# Patient Record
Sex: Male | Born: 1950 | Race: Black or African American | Hispanic: No | Marital: Married | Smoking: Former smoker
Health system: Southern US, Community
[De-identification: ages and names within clinical notes are randomized; demographics above are authoritative.]

## PROBLEM LIST (undated history)

## (undated) DIAGNOSIS — Z950 Presence of cardiac pacemaker: Secondary | ICD-10-CM

## (undated) DIAGNOSIS — G4733 Obstructive sleep apnea (adult) (pediatric): Secondary | ICD-10-CM

## (undated) DIAGNOSIS — E119 Type 2 diabetes mellitus without complications: Secondary | ICD-10-CM

## (undated) DIAGNOSIS — E042 Nontoxic multinodular goiter: Secondary | ICD-10-CM

## (undated) DIAGNOSIS — N289 Disorder of kidney and ureter, unspecified: Secondary | ICD-10-CM

## (undated) DIAGNOSIS — R0600 Dyspnea, unspecified: Secondary | ICD-10-CM

## (undated) DIAGNOSIS — M109 Gout, unspecified: Secondary | ICD-10-CM

## (undated) DIAGNOSIS — I447 Left bundle-branch block, unspecified: Secondary | ICD-10-CM

## (undated) DIAGNOSIS — I2510 Atherosclerotic heart disease of native coronary artery without angina pectoris: Secondary | ICD-10-CM

## (undated) DIAGNOSIS — D649 Anemia, unspecified: Secondary | ICD-10-CM

## (undated) DIAGNOSIS — R112 Nausea with vomiting, unspecified: Secondary | ICD-10-CM

## (undated) DIAGNOSIS — I509 Heart failure, unspecified: Secondary | ICD-10-CM

## (undated) DIAGNOSIS — Z9989 Dependence on other enabling machines and devices: Secondary | ICD-10-CM

## (undated) DIAGNOSIS — I10 Essential (primary) hypertension: Secondary | ICD-10-CM

## (undated) DIAGNOSIS — Z972 Presence of dental prosthetic device (complete) (partial): Secondary | ICD-10-CM

## (undated) DIAGNOSIS — Z87442 Personal history of urinary calculi: Secondary | ICD-10-CM

## (undated) DIAGNOSIS — E669 Obesity, unspecified: Secondary | ICD-10-CM

## (undated) DIAGNOSIS — E785 Hyperlipidemia, unspecified: Secondary | ICD-10-CM

## (undated) DIAGNOSIS — I639 Cerebral infarction, unspecified: Secondary | ICD-10-CM

## (undated) HISTORY — DX: Left bundle-branch block, unspecified: I44.7

## (undated) HISTORY — DX: Hyperlipidemia, unspecified: E78.5

## (undated) HISTORY — DX: Obesity, unspecified: E66.9

## (undated) HISTORY — DX: Essential (primary) hypertension: I10

## (undated) HISTORY — DX: Nontoxic multinodular goiter: E04.2

## (undated) HISTORY — DX: Atherosclerotic heart disease of native coronary artery without angina pectoris: I25.10

## (undated) HISTORY — PX: LAPAROSCOPIC CHOLECYSTECTOMY: SUR755

## (undated) HISTORY — DX: Cerebral infarction, unspecified: I63.9

## (undated) HISTORY — DX: Gout, unspecified: M10.9

## (undated) HISTORY — DX: Disorder of kidney and ureter, unspecified: N28.9

---

## 1995-10-05 ENCOUNTER — Other Ambulatory Visit: Payer: Self-pay

## 1997-12-17 ENCOUNTER — Inpatient Hospital Stay (HOSPITAL_COMMUNITY): Admission: EM | Admit: 1997-12-17 | Discharge: 1997-12-21 | Payer: Self-pay

## 1997-12-27 ENCOUNTER — Encounter: Admission: RE | Admit: 1997-12-27 | Discharge: 1998-03-27 | Payer: Self-pay | Admitting: Internal Medicine

## 1998-01-03 ENCOUNTER — Ambulatory Visit (HOSPITAL_COMMUNITY): Admission: RE | Admit: 1998-01-03 | Discharge: 1998-01-03 | Payer: Self-pay | Admitting: Internal Medicine

## 1998-03-14 ENCOUNTER — Ambulatory Visit (HOSPITAL_COMMUNITY): Admission: RE | Admit: 1998-03-14 | Discharge: 1998-03-14 | Payer: Self-pay | Admitting: Internal Medicine

## 1999-04-13 ENCOUNTER — Emergency Department (HOSPITAL_COMMUNITY): Admission: EM | Admit: 1999-04-13 | Discharge: 1999-04-13 | Payer: Self-pay | Admitting: Emergency Medicine

## 2001-07-12 ENCOUNTER — Ambulatory Visit (HOSPITAL_BASED_OUTPATIENT_CLINIC_OR_DEPARTMENT_OTHER): Admission: RE | Admit: 2001-07-12 | Discharge: 2001-07-12 | Payer: Self-pay | Admitting: Internal Medicine

## 2001-08-29 ENCOUNTER — Ambulatory Visit (HOSPITAL_BASED_OUTPATIENT_CLINIC_OR_DEPARTMENT_OTHER): Admission: RE | Admit: 2001-08-29 | Discharge: 2001-08-29 | Payer: Self-pay | Admitting: Internal Medicine

## 2004-04-08 ENCOUNTER — Encounter: Admission: RE | Admit: 2004-04-08 | Discharge: 2004-04-08 | Payer: Self-pay | Admitting: Internal Medicine

## 2005-05-16 ENCOUNTER — Encounter: Admission: RE | Admit: 2005-05-16 | Discharge: 2005-05-16 | Payer: Self-pay | Admitting: Sports Medicine

## 2009-06-14 DIAGNOSIS — I639 Cerebral infarction, unspecified: Secondary | ICD-10-CM

## 2009-06-14 HISTORY — DX: Cerebral infarction, unspecified: I63.9

## 2010-02-07 ENCOUNTER — Inpatient Hospital Stay (HOSPITAL_COMMUNITY): Admission: EM | Admit: 2010-02-07 | Discharge: 2010-02-10 | Payer: Self-pay | Admitting: Internal Medicine

## 2010-02-07 ENCOUNTER — Emergency Department (HOSPITAL_BASED_OUTPATIENT_CLINIC_OR_DEPARTMENT_OTHER): Admission: EM | Admit: 2010-02-07 | Discharge: 2010-02-07 | Payer: Self-pay | Admitting: Emergency Medicine

## 2010-02-07 ENCOUNTER — Ambulatory Visit: Payer: Self-pay | Admitting: Radiology

## 2010-02-08 ENCOUNTER — Encounter (INDEPENDENT_AMBULATORY_CARE_PROVIDER_SITE_OTHER): Payer: Self-pay | Admitting: Internal Medicine

## 2010-02-09 ENCOUNTER — Encounter (INDEPENDENT_AMBULATORY_CARE_PROVIDER_SITE_OTHER): Payer: Self-pay | Admitting: Internal Medicine

## 2010-04-27 ENCOUNTER — Inpatient Hospital Stay (HOSPITAL_BASED_OUTPATIENT_CLINIC_OR_DEPARTMENT_OTHER): Admission: RE | Admit: 2010-04-27 | Discharge: 2010-04-27 | Payer: Self-pay | Admitting: Interventional Cardiology

## 2010-08-25 LAB — POCT I-STAT GLUCOSE
Glucose, Bld: 140 mg/dL — ABNORMAL HIGH (ref 70–99)
Operator id: 141321

## 2010-08-28 LAB — COMPREHENSIVE METABOLIC PANEL
ALT: 15 U/L (ref 0–53)
ALT: 16 U/L (ref 0–53)
AST: 16 U/L (ref 0–37)
AST: 18 U/L (ref 0–37)
Albumin: 3.4 g/dL — ABNORMAL LOW (ref 3.5–5.2)
Albumin: 3.7 g/dL (ref 3.5–5.2)
Alkaline Phosphatase: 107 U/L (ref 39–117)
Alkaline Phosphatase: 84 U/L (ref 39–117)
BUN: 15 mg/dL (ref 6–23)
BUN: 19 mg/dL (ref 6–23)
CO2: 25 mEq/L (ref 19–32)
CO2: 26 mEq/L (ref 19–32)
Calcium: 10.2 mg/dL (ref 8.4–10.5)
Calcium: 10.6 mg/dL — ABNORMAL HIGH (ref 8.4–10.5)
Chloride: 107 mEq/L (ref 96–112)
Chloride: 109 mEq/L (ref 96–112)
Creatinine, Ser: 1.63 mg/dL — ABNORMAL HIGH (ref 0.4–1.5)
Creatinine, Ser: 1.9 mg/dL — ABNORMAL HIGH (ref 0.4–1.5)
GFR calc Af Amer: 44 mL/min — ABNORMAL LOW (ref >60)
GFR calc Af Amer: 53 mL/min — ABNORMAL LOW (ref >60)
GFR calc non Af Amer: 37 mL/min — ABNORMAL LOW (ref >60)
GFR calc non Af Amer: 44 mL/min — ABNORMAL LOW (ref >60)
Glucose, Bld: 200 mg/dL — ABNORMAL HIGH (ref 70–99)
Glucose, Bld: 222 mg/dL — ABNORMAL HIGH (ref 70–99)
Potassium: 3.3 mEq/L — ABNORMAL LOW (ref 3.5–5.1)
Potassium: 3.6 mEq/L (ref 3.5–5.1)
Sodium: 138 mEq/L (ref 135–145)
Sodium: 142 mEq/L (ref 135–145)
Total Bilirubin: 0.6 mg/dL (ref 0.3–1.2)
Total Bilirubin: 0.6 mg/dL (ref 0.3–1.2)
Total Protein: 6.5 g/dL (ref 6.0–8.3)
Total Protein: 7.1 g/dL (ref 6.0–8.3)

## 2010-08-28 LAB — IRON AND TIBC
Iron: 70 ug/dL (ref 42–135)
Saturation Ratios: 28 % (ref 20–55)
TIBC: 249 ug/dL (ref 215–435)
UIBC: 179 ug/dL

## 2010-08-28 LAB — LIPID PANEL
HDL: 34 mg/dL — ABNORMAL LOW (ref >39)
LDL Cholesterol: 87 mg/dL (ref 0–99)
Total CHOL/HDL Ratio: 5 RATIO
Triglycerides: 248 mg/dL — ABNORMAL HIGH (ref <150)
VLDL: 50 mg/dL — ABNORMAL HIGH (ref 0–40)

## 2010-08-28 LAB — CBC
HCT: 38.1 % — ABNORMAL LOW (ref 39.0–52.0)
HCT: 38.5 % — ABNORMAL LOW (ref 39.0–52.0)
HCT: 39 % (ref 39.0–52.0)
Hemoglobin: 12.6 g/dL — ABNORMAL LOW (ref 13.0–17.0)
Hemoglobin: 12.7 g/dL — ABNORMAL LOW (ref 13.0–17.0)
Hemoglobin: 12.9 g/dL — ABNORMAL LOW (ref 13.0–17.0)
MCH: 26.4 pg (ref 26.0–34.0)
MCH: 28.5 pg (ref 26.0–34.0)
MCHC: 32.7 g/dL (ref 30.0–36.0)
MCHC: 33.9 g/dL (ref 30.0–36.0)
MCV: 80.7 fL (ref 78.0–100.0)
MCV: 84.1 fL (ref 78.0–100.0)
Platelets: 195 10*3/uL (ref 150–400)
Platelets: 215 10*3/uL (ref 150–400)
RBC: 4.52 MIL/uL (ref 4.22–5.81)
RBC: 4.77 MIL/uL (ref 4.22–5.81)
RBC: 4.79 MIL/uL (ref 4.22–5.81)
RDW: 15.8 % — ABNORMAL HIGH (ref 11.5–15.5)
RDW: 15.9 % — ABNORMAL HIGH (ref 11.5–15.5)
WBC: 7.8 10*3/uL (ref 4.0–10.5)
WBC: 8.5 10*3/uL (ref 4.0–10.5)
WBC: 9.3 10*3/uL (ref 4.0–10.5)

## 2010-08-28 LAB — POCT CARDIAC MARKERS
CKMB, poc: 2.4 ng/mL (ref 1.0–8.0)
Myoglobin, poc: 198 ng/mL (ref 12–200)
Troponin i, poc: <0.05 ng/mL (ref 0.00–0.09)

## 2010-08-28 LAB — HEMOGLOBIN A1C
Hgb A1c MFr Bld: 10.2 % — ABNORMAL HIGH (ref <5.7)
Mean Plasma Glucose: 246 mg/dL — ABNORMAL HIGH (ref <117)

## 2010-08-28 LAB — CARDIAC PANEL(CRET KIN+CKTOT+MB+TROPI)
CK, MB: 2.7 ng/mL (ref 0.3–4.0)
Relative Index: 1.1 (ref 0.0–2.5)
Total CK: 240 U/L — ABNORMAL HIGH (ref 7–232)
Troponin I: 0.04 ng/mL (ref 0.00–0.06)

## 2010-08-28 LAB — URINALYSIS, ROUTINE W REFLEX MICROSCOPIC
Bilirubin Urine: NEGATIVE
Glucose, UA: 100 mg/dL — AB
Hgb urine dipstick: NEGATIVE
Ketones, ur: NEGATIVE mg/dL
Leukocytes, UA: NEGATIVE
Nitrite: NEGATIVE
Protein, ur: 30 mg/dL — AB
Specific Gravity, Urine: 1.01 (ref 1.005–1.030)
Urobilinogen, UA: 1 mg/dL (ref 0.0–1.0)
pH: 7 (ref 5.0–8.0)

## 2010-08-28 LAB — PHOSPHORUS: Phosphorus: 2.8 mg/dL (ref 2.3–4.6)

## 2010-08-28 LAB — GLUCOSE, CAPILLARY
Glucose-Capillary: 150 mg/dL — ABNORMAL HIGH (ref 70–99)
Glucose-Capillary: 160 mg/dL — ABNORMAL HIGH (ref 70–99)
Glucose-Capillary: 184 mg/dL — ABNORMAL HIGH (ref 70–99)
Glucose-Capillary: 185 mg/dL — ABNORMAL HIGH (ref 70–99)
Glucose-Capillary: 195 mg/dL — ABNORMAL HIGH (ref 70–99)
Glucose-Capillary: 196 mg/dL — ABNORMAL HIGH (ref 70–99)
Glucose-Capillary: 198 mg/dL — ABNORMAL HIGH (ref 70–99)

## 2010-08-28 LAB — MRSA PCR SCREENING: MRSA by PCR: NEGATIVE

## 2010-08-28 LAB — BASIC METABOLIC PANEL
Calcium: 10.1 mg/dL (ref 8.4–10.5)
GFR calc Af Amer: 56 mL/min — ABNORMAL LOW (ref >60)
GFR calc non Af Amer: 46 mL/min — ABNORMAL LOW (ref >60)
Potassium: 3.3 mEq/L — ABNORMAL LOW (ref 3.5–5.1)
Sodium: 138 mEq/L (ref 135–145)

## 2010-08-28 LAB — APTT: aPTT: 31 seconds (ref 24–37)

## 2010-08-28 LAB — PTH, INTACT AND CALCIUM: PTH: 142.9 pg/mL — ABNORMAL HIGH (ref 14.0–72.0)

## 2010-08-28 LAB — RETICULOCYTES
RBC.: 4.72 MIL/uL (ref 4.22–5.81)
Retic Ct Pct: 1.2 % (ref 0.4–3.1)

## 2010-08-28 LAB — FOLATE: Folate: 10.9 ng/mL

## 2010-08-28 LAB — DIFFERENTIAL
Basophils Absolute: 0 10*3/uL (ref 0.0–0.1)
Basophils Relative: 0 % (ref 0–1)
Lymphocytes Relative: 46 % (ref 12–46)
Monocytes Absolute: 0.7 10*3/uL (ref 0.1–1.0)
Monocytes Relative: 9 % (ref 3–12)
Neutro Abs: 3.3 10*3/uL (ref 1.7–7.7)
Neutrophils Relative %: 43 % (ref 43–77)

## 2010-08-28 LAB — PROTIME-INR
INR: 1.09 (ref 0.00–1.49)
Prothrombin Time: 14.3 seconds (ref 11.6–15.2)

## 2010-08-28 LAB — VITAMIN D 1,25 DIHYDROXY: Vitamin D 1, 25 (OH)2 Total: 59 pg/mL (ref 18–72)

## 2010-08-28 LAB — FERRITIN: Ferritin: 267 ng/mL (ref 22–322)

## 2010-08-28 LAB — HOMOCYSTEINE: Homocysteine: 14.3 umol/L (ref 4.0–15.4)

## 2010-08-28 LAB — VITAMIN B12: Vitamin B-12: 329 pg/mL (ref 211–911)

## 2011-09-29 ENCOUNTER — Other Ambulatory Visit: Payer: Self-pay | Admitting: Internal Medicine

## 2012-01-06 ENCOUNTER — Other Ambulatory Visit: Payer: Self-pay | Admitting: Internal Medicine

## 2012-01-22 ENCOUNTER — Other Ambulatory Visit: Payer: Self-pay | Admitting: Internal Medicine

## 2012-02-02 ENCOUNTER — Other Ambulatory Visit: Payer: Self-pay | Admitting: Internal Medicine

## 2012-02-03 LAB — BASIC METABOLIC PANEL
BUN: 24 mg/dL — AB (ref 4–21)
Creatinine: 2.2 mg/dL — AB (ref 0.6–1.3)
Potassium: 3.9 mmol/L (ref 3.4–5.3)

## 2012-02-03 LAB — HEPATIC FUNCTION PANEL: ALT: 16 U/L (ref 10–40)

## 2012-02-03 LAB — HEMOGLOBIN A1C: Hgb A1c MFr Bld: 11.5 % — AB (ref 4.0–6.0)

## 2012-02-18 ENCOUNTER — Other Ambulatory Visit: Payer: Self-pay | Admitting: Internal Medicine

## 2012-08-02 ENCOUNTER — Encounter: Payer: Self-pay | Admitting: Hematology

## 2013-01-26 ENCOUNTER — Other Ambulatory Visit: Payer: Self-pay | Admitting: Interventional Cardiology

## 2013-01-26 DIAGNOSIS — E049 Nontoxic goiter, unspecified: Secondary | ICD-10-CM

## 2013-02-01 ENCOUNTER — Ambulatory Visit
Admission: RE | Admit: 2013-02-01 | Discharge: 2013-02-01 | Disposition: A | Discharge status: Home / Self Care | Payer: 59 | Source: Ambulatory Visit | Attending: Interventional Cardiology | Admitting: Interventional Cardiology

## 2013-02-01 DIAGNOSIS — E049 Nontoxic goiter, unspecified: Secondary | ICD-10-CM

## 2013-02-05 ENCOUNTER — Other Ambulatory Visit: Payer: Self-pay | Admitting: Interventional Cardiology

## 2013-02-05 DIAGNOSIS — E041 Nontoxic single thyroid nodule: Secondary | ICD-10-CM

## 2013-02-14 ENCOUNTER — Other Ambulatory Visit: Payer: 59

## 2013-02-14 ENCOUNTER — Ambulatory Visit
Admission: RE | Admit: 2013-02-14 | Discharge: 2013-02-14 | Disposition: A | Discharge status: Home / Self Care | Payer: 59 | Source: Ambulatory Visit | Attending: Interventional Cardiology | Admitting: Interventional Cardiology

## 2013-02-14 ENCOUNTER — Other Ambulatory Visit (HOSPITAL_COMMUNITY)
Admission: RE | Admit: 2013-02-14 | Discharge: 2013-02-14 | Disposition: A | Discharge status: Home / Self Care | Payer: 59 | Source: Ambulatory Visit | Attending: Interventional Radiology | Admitting: Interventional Radiology

## 2013-02-14 DIAGNOSIS — E041 Nontoxic single thyroid nodule: Secondary | ICD-10-CM

## 2013-08-06 ENCOUNTER — Ambulatory Visit: Payer: 59 | Admitting: Interventional Cardiology

## 2013-08-23 ENCOUNTER — Other Ambulatory Visit: Payer: Self-pay | Admitting: Internal Medicine

## 2013-08-23 DIAGNOSIS — E042 Nontoxic multinodular goiter: Secondary | ICD-10-CM

## 2013-09-12 ENCOUNTER — Ambulatory Visit: Payer: 59 | Admitting: Interventional Cardiology

## 2013-09-13 ENCOUNTER — Ambulatory Visit
Admission: RE | Admit: 2013-09-13 | Discharge: 2013-09-13 | Disposition: A | Discharge status: Home / Self Care | Payer: 59 | Source: Ambulatory Visit | Attending: Internal Medicine | Admitting: Internal Medicine

## 2013-09-13 DIAGNOSIS — E042 Nontoxic multinodular goiter: Secondary | ICD-10-CM

## 2013-09-27 ENCOUNTER — Ambulatory Visit: Payer: 59 | Admitting: Interventional Cardiology

## 2013-11-13 ENCOUNTER — Encounter: Payer: Self-pay | Admitting: *Deleted

## 2013-11-15 ENCOUNTER — Encounter: Payer: Self-pay | Admitting: Interventional Cardiology

## 2013-11-15 ENCOUNTER — Ambulatory Visit (INDEPENDENT_AMBULATORY_CARE_PROVIDER_SITE_OTHER): Payer: 59 | Admitting: Interventional Cardiology

## 2013-11-15 VITALS — BP 130/74 | HR 48 | Ht 72.0 in | Wt 287.0 lb

## 2013-11-15 DIAGNOSIS — I2510 Atherosclerotic heart disease of native coronary artery without angina pectoris: Secondary | ICD-10-CM

## 2013-11-15 DIAGNOSIS — I10 Essential (primary) hypertension: Secondary | ICD-10-CM

## 2013-11-15 DIAGNOSIS — G4733 Obstructive sleep apnea (adult) (pediatric): Secondary | ICD-10-CM | POA: Insufficient documentation

## 2013-11-15 DIAGNOSIS — I6350 Cerebral infarction due to unspecified occlusion or stenosis of unspecified cerebral artery: Secondary | ICD-10-CM

## 2013-11-15 DIAGNOSIS — N529 Male erectile dysfunction, unspecified: Secondary | ICD-10-CM | POA: Insufficient documentation

## 2013-11-15 DIAGNOSIS — I639 Cerebral infarction, unspecified: Secondary | ICD-10-CM | POA: Insufficient documentation

## 2013-11-15 DIAGNOSIS — N183 Chronic kidney disease, stage 3 unspecified: Secondary | ICD-10-CM

## 2013-11-15 DIAGNOSIS — I720 Aneurysm of carotid artery: Secondary | ICD-10-CM | POA: Insufficient documentation

## 2013-11-15 DIAGNOSIS — G4730 Sleep apnea, unspecified: Secondary | ICD-10-CM

## 2013-11-15 DIAGNOSIS — E785 Hyperlipidemia, unspecified: Secondary | ICD-10-CM

## 2013-11-15 HISTORY — DX: Atherosclerotic heart disease of native coronary artery without angina pectoris: I25.10

## 2013-11-15 HISTORY — DX: Male erectile dysfunction, unspecified: N52.9

## 2013-11-15 HISTORY — DX: Aneurysm of carotid artery: I72.0

## 2013-11-15 HISTORY — DX: Essential (primary) hypertension: I10

## 2013-11-15 HISTORY — DX: Cerebral infarction, unspecified: I63.9

## 2013-11-15 HISTORY — DX: Obstructive sleep apnea (adult) (pediatric): G47.33

## 2013-11-15 MED ORDER — SILDENAFIL CITRATE 100 MG PO TABS: 100.0000 mg | ORAL_TABLET | Freq: Every day | ORAL | Status: DC | PRN

## 2013-11-15 NOTE — Patient Instructions (Addendum)
Your physician has recommended you make the following change in your medication:  1) Viagra 100mg  to be taken as needed. An Rx has been sent to your pharmacy  Take all other medications as prescribed  Your physician wants you to follow-up in: 9 months You will receive a reminder letter in the mail two months in advance. If you don't receive a letter, please call our office to schedule the follow-up appointment.

## 2013-11-15 NOTE — Progress Notes (Signed)
Patient ID: Matthew Wood, male   DOB: 04-Dec-1950, 63 y.o.   MRN: [id number]    [address] [place], [place]  [address] Phone: [phone] Fax:  [phone]  Date:  11/15/2013   ID:  Matthew Wood, DOB 03-09-1951, MRN [id number]  PCP:  No primary provider on file.   ASSESSMENT:  1. Chronic diastolic heart failure 2. Hypertension 3. Coronary artery disease, stable 4. Erectile dysfunction 5. Chronic kidney disease, stage III  PLAN:  1. no change in the current medical regimen 2. Call if angina or dyspnea or edema 3. Clinical followup in 6-9 months 4. Active lifestyle and weight loss   SUBJECTIVE: Matthew Wood is a 63 y.o. male who denies angina, orthopnea, and syncope. No PND or lower extremity edema has been noted. He denies nitroglycerin use. No neurological complaints. 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 Appetite is excellent and weight loss has not occurred.   Wt Readings from Last 3 Encounters:  11/15/13 287 lb (130.182 kg)  02/03/12 300 lb (136.079 kg)     Past Medical History  Diagnosis Date  . Hypertension   . Renal insufficiency   . Obesity   . Hyperlipidemia   . Coronary artery disease   . Stroke   . Diabetes   . Gout   . CVA (cerebrovascular accident) 2011    potine  . OSA (obstructive sleep apnea)     Current Outpatient Prescriptions  Medication Sig Dispense Refill  . allopurinol (ZYLOPRIM) 300 MG tablet Take 300 mg by mouth daily.      Marland Kitchen amLODipine-benazepril (LOTREL) 10-40 MG per capsule Take 1 capsule by mouth daily.      Marland Kitchen aspirin 325 MG tablet Take 325 mg by mouth daily.      . carvedilol (COREG) 25 MG tablet Take 25 mg by mouth 2 (two) times daily with a meal.      . cholecalciferol (VITAMIN D) 400 UNITS TABS tablet Take 400 Units by mouth daily.      . cloNIDine (CATAPRES) 0.2 MG tablet Take 0.2 mg by mouth 2 (two) times daily.      . furosemide (LASIX) 40 MG tablet Take 40 mg  by mouth 2 (two) times daily.      Marland Kitchen glimepiride (AMARYL) 4 MG tablet Take 4 mg by mouth 2 (two) times daily.      . insulin glargine (LANTUS) 100 UNIT/ML injection Inject 40 Units into the skin at bedtime.      . insulin lispro (HUMALOG) 100 UNIT/ML injection Inject into the skin 3 (three) times daily before meals.      . NON FORMULARY cpap as directed      . potassium chloride (K-DUR) 10 MEQ tablet TAKE ONE TABLET BY MOUTH TWICE DAILY AFTER A MEAL AND FULL GLASS OFWATER  60 tablet  2  . potassium chloride (K-DUR,KLOR-CON) 10 MEQ tablet Take 10 mEq by mouth 2 (two) times daily.      . rosuvastatin (CRESTOR) 20 MG tablet Take 20 mg by mouth daily.      . saxagliptin HCl (ONGLYZA) 5 MG TABS tablet Take 5 mg by mouth daily.       No current facility-administered medications for this visit.    Allergies:   No Known Allergies  Social History:  The patient  reports that he has quit smoking. He does not have any smokeless tobacco history on file. He reports that he drinks  alcohol.   ROS:  Please see the history of present illness.   No claudication. Seen Dr. Buddy Duty for diabetes control.  Seeing Dr. Marval Regal for renal function All other systems reviewed and negative.   OBJECTIVE: VS:  BP 130/74  Pulse 48  Ht 6' (1.829 m)  Wt 287 lb (130.182 kg)  BMI 38.92 kg/m2 Well nourished, well developed, in no acute distress, markedly obese HEENT: normal Neck: JVD absent. Carotid bruit absent but pulsatile left supraclavicular area  Cardiac:  normal S1, S2; RRR; no murmur Lungs:  clear to auscultation bilaterally, no wheezing, rhonchi or rales Abd: soft, nontender, no hepatomegaly Ext: Edema  Trace bilateral . Pulses 2+ bilateral  Skin: warm and dry Neuro:  CNs 2-12 intact, no focal abnormalities noted  EKG:  Sinus bradycardia at 48 beats per minute with left bundle branch block.       Signed, Illene Labrador III, MD 11/15/2013 12:27 PM

## 2013-11-29 NOTE — Patient Instructions (Signed)
Pt wife aware pt cannot use viagra. He is currently taking bidil and there is a interaction

## 2013-12-12 ENCOUNTER — Other Ambulatory Visit: Payer: Self-pay | Admitting: Gastroenterology

## 2014-03-29 ENCOUNTER — Other Ambulatory Visit: Payer: Self-pay | Admitting: Gastroenterology

## 2014-03-29 ENCOUNTER — Encounter (HOSPITAL_COMMUNITY): Payer: Self-pay | Admitting: Pharmacy Technician

## 2014-04-03 ENCOUNTER — Encounter (HOSPITAL_COMMUNITY): Payer: Self-pay | Admitting: *Deleted

## 2014-04-16 ENCOUNTER — Ambulatory Visit (HOSPITAL_COMMUNITY)
Admission: RE | Admit: 2014-04-16 | Discharge: 2014-04-16 | Disposition: A | Discharge status: Home / Self Care | Payer: 59 | Source: Ambulatory Visit | Attending: Gastroenterology | Admitting: Gastroenterology

## 2014-04-16 ENCOUNTER — Encounter (HOSPITAL_COMMUNITY)
Admission: RE | Disposition: A | Discharge status: Home / Self Care | Payer: Self-pay | Source: Ambulatory Visit | Attending: Gastroenterology

## 2014-04-16 ENCOUNTER — Ambulatory Visit (HOSPITAL_COMMUNITY): Payer: 59 | Admitting: Anesthesiology

## 2014-04-16 ENCOUNTER — Encounter (HOSPITAL_COMMUNITY): Payer: Self-pay | Admitting: *Deleted

## 2014-04-16 DIAGNOSIS — E042 Nontoxic multinodular goiter: Secondary | ICD-10-CM | POA: Diagnosis not present

## 2014-04-16 DIAGNOSIS — D123 Benign neoplasm of transverse colon: Secondary | ICD-10-CM | POA: Insufficient documentation

## 2014-04-16 DIAGNOSIS — I129 Hypertensive chronic kidney disease with stage 1 through stage 4 chronic kidney disease, or unspecified chronic kidney disease: Secondary | ICD-10-CM | POA: Diagnosis not present

## 2014-04-16 DIAGNOSIS — E780 Pure hypercholesterolemia: Secondary | ICD-10-CM | POA: Insufficient documentation

## 2014-04-16 DIAGNOSIS — D125 Benign neoplasm of sigmoid colon: Secondary | ICD-10-CM | POA: Diagnosis not present

## 2014-04-16 DIAGNOSIS — M109 Gout, unspecified: Secondary | ICD-10-CM | POA: Diagnosis not present

## 2014-04-16 DIAGNOSIS — N189 Chronic kidney disease, unspecified: Secondary | ICD-10-CM | POA: Insufficient documentation

## 2014-04-16 DIAGNOSIS — E1121 Type 2 diabetes mellitus with diabetic nephropathy: Secondary | ICD-10-CM | POA: Diagnosis not present

## 2014-04-16 DIAGNOSIS — G4733 Obstructive sleep apnea (adult) (pediatric): Secondary | ICD-10-CM | POA: Diagnosis not present

## 2014-04-16 DIAGNOSIS — I2510 Atherosclerotic heart disease of native coronary artery without angina pectoris: Secondary | ICD-10-CM | POA: Insufficient documentation

## 2014-04-16 DIAGNOSIS — Z1211 Encounter for screening for malignant neoplasm of colon: Secondary | ICD-10-CM | POA: Insufficient documentation

## 2014-04-16 DIAGNOSIS — D122 Benign neoplasm of ascending colon: Secondary | ICD-10-CM | POA: Insufficient documentation

## 2014-04-16 HISTORY — PX: COLONOSCOPY WITH PROPOFOL: SHX5780

## 2014-04-16 HISTORY — DX: Heart failure, unspecified: I50.9

## 2014-04-16 LAB — GLUCOSE, CAPILLARY: Glucose-Capillary: 129 mg/dL — ABNORMAL HIGH (ref 70–99)

## 2014-04-16 SURGERY — COLONOSCOPY WITH PROPOFOL
Anesthesia: Monitor Anesthesia Care

## 2014-04-16 MED ORDER — PROPOFOL 10 MG/ML IV BOLUS
INTRAVENOUS | Status: AC
  Filled 2014-04-16: qty 20

## 2014-04-16 MED ORDER — PROPOFOL INFUSION 10 MG/ML OPTIME
INTRAVENOUS | Status: DC | PRN
  Administered 2014-04-16: 140 ug/kg/min via INTRAVENOUS

## 2014-04-16 MED ORDER — LACTATED RINGERS IV SOLN
INTRAVENOUS | Status: DC | PRN
  Administered 2014-04-16: 13:00:00 via INTRAVENOUS

## 2014-04-16 MED ORDER — SODIUM CHLORIDE 0.9 % IV SOLN
INTRAVENOUS | Status: DC
  Administered 2014-04-16: 1000 mL via INTRAVENOUS

## 2014-04-16 SURGICAL SUPPLY — 21 items

## 2014-04-16 NOTE — Anesthesia Postprocedure Evaluation (Signed)
  Anesthesia Post-op Note  Patient: Matthew Wood  Procedure(s) Performed: Procedure(s): COLONOSCOPY WITH PROPOFOL (N/A)  Patient Location: PACU  Anesthesia Type:MAC  Level of Consciousness: awake  Airway and Oxygen Therapy: Patient Spontanous Breathing  Post-op Pain: none  Post-op Assessment: Post-op Vital signs reviewed, Patient's Cardiovascular Status Stable, Respiratory Function Stable, Patent Airway, No signs of Nausea or vomiting and Pain level controlled  Post-op Vital Signs: Reviewed and stable  Last Vitals:  Filed Vitals:   04/16/14 1500  BP:   Pulse: 62  Temp:   Resp: 17    Complications: No apparent anesthesia complications

## 2014-04-16 NOTE — Discharge Instructions (Signed)

## 2014-04-16 NOTE — Op Note (Signed)
Procedure: Baseline screening colonoscopy  Endoscopist: Earle Gell  Premedication: Propofol administered by anesthesia  Colonoscope was introduced into the rectum at 1:38 PM. Cecum was reached at 1:44 PM. Procedure concluded at 2:24 PM.  Procedure: The patient was placed in the left lateral decubitus position. Anal inspection and digital rectal exam were normal. The Pentax pediatric colonoscope was introduced into the rectum and advanced to the cecum. A normal-appearing ileocecal valve and appendiceal orifice were identified. Colonic preparation for the exam today was fair.  Rectum. Two 5-6 millimeters sized sessile polyps were removed with the hot snare. Retroflexed view of the distal rectum was normal.  Sigmoid colon and descending colon. From the distal sigmoid colon a 5 mm sessile polyp was removed with the cold snare.  Splenic flexure. Normal  Transverse colon. From the mid transverse colon, two 5 mm sized sessile polyps were removed with the cold snare.  Hepatic flexure. Normal  Ascending colon. Three 5 mm sized polyps were removed with the cold snare.  Cecum and ileocecal valve. Normal  Assessment: Multiple small polyps removed from the rectum and colon using the cold and hot snare  Recommendation: Schedule surveillance colonoscopy in 1 year

## 2014-04-16 NOTE — Anesthesia Preprocedure Evaluation (Signed)
Anesthesia Evaluation  Patient identified by MRN, date of birth, ID band Patient awake    Reviewed: Allergy & Precautions, H&P , NPO status , Patient's Chart, lab work & pertinent test results  History of Anesthesia Complications Negative for: history of anesthetic complications  Airway Mallampati: III  TM Distance: >3 FB Neck ROM: Full    Dental  (+) Missing   Pulmonary neg shortness of breath, sleep apnea and Continuous Positive Airway Pressure Ventilation , neg COPDneg recent URI, former smoker,  breath sounds clear to auscultation        Cardiovascular hypertension, Pt. on medications and Pt. on home beta blockers + CAD, + Peripheral Vascular Disease and +CHF Rhythm:Regular     Neuro/Psych CVA, No Residual Symptoms    GI/Hepatic negative GI ROS, Neg liver ROS,   Endo/Other  diabetes, Type 2, Insulin DependentHypothyroidism Morbid obesity  Renal/GU CRFRenal disease     Musculoskeletal   Abdominal   Peds  Hematology   Anesthesia Other Findings   Reproductive/Obstetrics                             Anesthesia Physical Anesthesia Plan  ASA: III  Anesthesia Plan: MAC   Post-op Pain Management:    Induction: Intravenous  Airway Management Planned: Natural Airway and Simple Face Mask  Additional Equipment: None  Intra-op Plan:   Post-operative Plan:   Informed Consent: I have reviewed the patients History and Physical, chart, labs and discussed the procedure including the risks, benefits and alternatives for the proposed anesthesia with the patient or authorized representative who has indicated his/her understanding and acceptance.   Dental advisory given  Plan Discussed with: CRNA and Surgeon  Anesthesia Plan Comments:         Anesthesia Quick Evaluation

## 2014-04-16 NOTE — H&P (Signed)
  Procedure: Baseline screening colonoscopy. Chronic kidney disease with proteinuria, diabetic nephropathy, coronary artery disease, carotid artery disease, obstructive sleep apnea syndrome.  History: The patient is a 63 year old male born Mar 29, 1951. He is scheduled to undergo his first screening colonoscopy with polypectomy to prevent colon cancer.  Past medical and surgical history: Cholecystectomy. Hypertension. 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 Gout. 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 Hypercholesterolemia. Obstructive sleep apnea syndrome. Coronary artery disease. Multinodular goiter. Obstructive sleep apnea syndrome.  Medication allergies: None  Exam: The patient is alert and lying comfortably on the endoscopy stretcher. Lungs are clear to auscultation. Cardiac exam reveals a regular rhythm. Abdomen is soft and nontender to palpation.  Plan: Proceed with baseline screening colonoscopy using propofol sedation

## 2014-04-16 NOTE — Transfer of Care (Signed)
Immediate Anesthesia Transfer of Care Note  Patient: Matthew Wood  Procedure(s) Performed: Procedure(s): COLONOSCOPY WITH PROPOFOL (N/A)  Patient Location: PACU  Anesthesia Type:MAC  Level of Consciousness: awake  Airway & Oxygen Therapy: Patient Spontanous Breathing and Patient connected to nasal cannula oxygen  Post-op Assessment: Report given to PACU RN and Post -op Vital signs reviewed and stable  Post vital signs: Reviewed and stable  Complications: No apparent anesthesia complications

## 2014-04-17 ENCOUNTER — Encounter (HOSPITAL_COMMUNITY): Payer: Self-pay | Admitting: Gastroenterology

## 2014-10-03 ENCOUNTER — Other Ambulatory Visit: Payer: Self-pay | Admitting: Internal Medicine

## 2014-10-03 DIAGNOSIS — E042 Nontoxic multinodular goiter: Secondary | ICD-10-CM

## 2014-10-24 ENCOUNTER — Ambulatory Visit
Admission: RE | Admit: 2014-10-24 | Discharge: 2014-10-24 | Disposition: A | Discharge status: Home / Self Care | Payer: 59 | Source: Ambulatory Visit | Attending: Internal Medicine | Admitting: Internal Medicine

## 2014-10-24 DIAGNOSIS — E042 Nontoxic multinodular goiter: Secondary | ICD-10-CM

## 2014-10-31 ENCOUNTER — Ambulatory Visit (INDEPENDENT_AMBULATORY_CARE_PROVIDER_SITE_OTHER): Payer: 59 | Admitting: Interventional Cardiology

## 2014-10-31 ENCOUNTER — Encounter: Payer: Self-pay | Admitting: Interventional Cardiology

## 2014-10-31 VITALS — BP 154/82 | HR 62 | Ht 71.0 in | Wt 296.0 lb

## 2014-10-31 DIAGNOSIS — I2510 Atherosclerotic heart disease of native coronary artery without angina pectoris: Secondary | ICD-10-CM | POA: Diagnosis not present

## 2014-10-31 DIAGNOSIS — E785 Hyperlipidemia, unspecified: Secondary | ICD-10-CM

## 2014-10-31 DIAGNOSIS — I10 Essential (primary) hypertension: Secondary | ICD-10-CM | POA: Diagnosis not present

## 2014-10-31 DIAGNOSIS — G4730 Sleep apnea, unspecified: Secondary | ICD-10-CM

## 2014-10-31 NOTE — Progress Notes (Signed)
Cardiology Office Note   Date:  10/31/2014   ID:  Matthew Wood, DOB 04-Sep-1950, MRN [id number]  PCP:  Kevan Ny, MD  Cardiologist:   Sinclair Grooms, MD   Chief Complaint  Patient presents with  . Coronary Artery Disease    NATIVE      History of Present Illness: Matthew Wood is a 64 y.o. male who presents for follow-up of chronic combined systolic and diastolic heart failure, hypertension, hyperlipidemia, and coronary artery disease. He furthermore has stage III chronic kidney disease.  We have is doing well although he is very inactive. He continues to weigh nearly 300 pounds. He has not had chest discomfort. He denies orthopnea. His wife states that with activity she can hear and breathing but he denies shortness of breath. He has not had palpitations or syncope. There've been no recurring episodes to suggest stroke although he did have some mild left arm numbness several weeks ago. This is completely resolved.    Past Medical History  Diagnosis Date  . Hypertension   . Renal insufficiency   . Obesity   . Hyperlipidemia   . Coronary artery disease   . Stroke   . Diabetes   . Gout   . CVA (cerebrovascular accident) 2011    potine  . OSA (obstructive sleep apnea)   . CHF (congestive heart failure)     Past Surgical History  Procedure Laterality Date  . Cholecystectomy    . Colonoscopy with propofol N/A 04/16/2014    Procedure: COLONOSCOPY WITH PROPOFOL;  Surgeon: [name], MD;  Location: [facility];  Service: Endoscopy;  Laterality: N/A;     Current Outpatient Prescriptions  Medication Sig Dispense Refill  . allopurinol (ZYLOPRIM) 300 MG tablet Take 300 mg by mouth daily.    Marland Kitchen amLODipine-benazepril (LOTREL) 10-40 MG per capsule Take 1 capsule by mouth every morning.     . carvedilol (COREG) 25 MG tablet Take 25 mg by mouth 2 (two) times daily with a meal.    . cloNIDine (CATAPRES) 0.2 MG tablet Take 0.2-1 mg by mouth 2 (two) times daily.       . furosemide (LASIX) 40 MG tablet Take 40 mg by mouth 2 (two) times daily.    Marland Kitchen glimepiride (AMARYL) 4 MG tablet Take 4 mg by mouth 2 (two) times daily.    . insulin glargine (LANTUS) 100 UNIT/ML injection Inject 33 Units into the skin at bedtime.     . insulin lispro (HUMALOG) 100 UNIT/ML injection Inject 12-16 Units into the skin 3 (three) times daily before meals.     . isosorbide-hydrALAZINE (BIDIL) 20-37.5 MG per tablet Take 1 tablet by mouth 2 (two) times daily.    . NON FORMULARY cpap as directed    . potassium chloride (K-DUR,KLOR-CON) 10 MEQ tablet Take 10 mEq by mouth 2 (two) times daily.    . rosuvastatin (CRESTOR) 20 MG tablet Take 20 mg by mouth daily.    . saxagliptin HCl (ONGLYZA) 5 MG TABS tablet Take 5 mg by mouth daily.     No current facility-administered medications for this visit.    Allergies:   Review of patient's allergies indicates no known allergies.    Social History:  The patient  reports that he quit smoking about 4 years ago. He does not have any smokeless tobacco history on file. He reports that he drinks alcohol. He reports that he does not use illicit drugs.   Family History:  The patient's  family history includes Breast cancer in his mother; Diabetes type II in his father; Heart attack in his father; Hypertension in his mother and sister; Stroke in his father.    ROS:  Please see the history of present illness.   Otherwise, review of systems are positive for left arm numbness, dizziness, relative inactivity..   All other systems are reviewed and negative.    PHYSICAL EXAM: VS:  BP 154/82 mmHg  Pulse 62  Ht 5\' 11"  (1.803 m)  Wt 296 lb (134.265 kg)  BMI 41.30 kg/m2 , BMI Body mass index is 41.3 kg/(m^2). GEN: Well nourished, well developed, in no acute distress. Morbidly obese. HEENT: normal Neck: no JVD, carotid bruits, or masses Cardiac: An S4 gallop is audible. RRR. There is no murmur, rubs, or S3 gallop. He does have 2+ bilateral lower extremity  edema . Respiratory:  clear to auscultation bilaterally, normal work of breathing GI: soft, nontender, nondistended, + BS MS: no deformity or atrophy Skin: warm and dry, no rash Neuro:  Strength and sensation are intact Psych: euthymic mood, full affect   EKG:  EKG is ordered today. The ekg ordered today demonstrates sinus rhythm with first-degree AV block, interventricular conduction delay, and lateral T wave inversion.   Recent Labs: No results found for requested labs within last 365 days.    Lipid Panel    Component Value Date/Time   CHOL  02/08/2010 0600    171        ATP III CLASSIFICATION:  <200     mg/dL   Desirable  200-239  mg/dL   Borderline High  >=240    mg/dL   High          TRIG 248* 02/08/2010 0600   HDL 34* 02/08/2010 0600   CHOLHDL 5.0 02/08/2010 0600   VLDL 50* 02/08/2010 0600   LDLCALC  02/08/2010 0600    87        Total Cholesterol/HDL:CHD Risk Coronary Heart Disease Risk Table                     Men   Women  1/2 Average Risk   3.4   3.3  Average Risk       5.0   4.4  2 X Average Risk   9.6   7.1  3 X Average Risk  23.4   11.0        Use the calculated Patient Ratio above and the CHD Risk Table to determine the patient's CHD Risk.        ATP III CLASSIFICATION (LDL):  <100     mg/dL   Optimal  100-129  mg/dL   Near or Above                    Optimal  130-159  mg/dL   Borderline  160-189  mg/dL   High  >190     mg/dL   Very High      Wt Readings from Last 3 Encounters:  10/31/14 296 lb (134.265 kg)  04/16/14 296 lb (134.265 kg)  11/15/13 287 lb (130.182 kg)      Other studies Reviewed: Additional studies/ records that were reviewed today include: None.   ASSESSMENT AND PLAN:  Coronary artery disease involving native coronary artery of native heart without angina pectoris: Absent  Essential hypertension: Borderline control  Hyperlipidemia: Followed by primary care  Sleep apnea: On therapy  Diabetes mellitus Type II,  complicated with chronic kidney  disease: Followed by Dr. Marval Regal   Current medicines are reviewed at length with the patient today.  The patient does not have concerns regarding medicines.  The following changes have been made:  no change  Labs/ tests ordered today include:  No orders of the defined types were placed in this encounter.     Disposition:   FU with HS in 1 year  Signed, Sinclair Grooms, MD  10/31/2014 5:20 PM    [address], [place], [place]  [address] Phone: [phone]; Fax: [phone]

## 2014-10-31 NOTE — Patient Instructions (Signed)
Medication Instructions:  Your physician recommends that you continue on your current medications as directed. Please refer to the Current Medication list given to you today.   Labwork: None   Testing/Procedures: None   Follow-Up: Your physician wants you to follow-up in: 1 year with Dr.Smith You will receive a reminder letter in the mail two months in advance. If you don't receive a letter, please call our office to schedule the follow-up appointment.   Any Other Special Instructions Will Be Listed Below (If Applicable). Your physician discussed the importance of regular exercise and recommended that you start or continue a regular exercise program for good health.

## 2015-12-26 ENCOUNTER — Telehealth: Payer: Self-pay | Admitting: Interventional Cardiology

## 2015-12-26 NOTE — Telephone Encounter (Signed)
Spoke with pt wife.  she would like the pt to be seen sooner than 8/3. The are heading out of the town the end of July and she would like the pt seen before they go on vacation. Pt wife sts that the pt is having increased sob, mainly when outdoors in the heat.Pt has continue to gain weight over the last several months. Pt still works part time, but is very sedentary. Pt also has a lot of dietary indiscretions. Pt does not weigh regularly. Pt usually has some LE edema present at the end of the day. Pt denies chest pian, palpitations, orthopnea, sob at rest.  Pt is off on Thurs and would like an appt for 7/20. Adv pt wife that I will ck with scheduling to see what earlier appt date and times are available and call back. Pt wife agreeable with plan and verbalized understanding.   Called pt wife back lmtcb

## 2015-12-26 NOTE — Telephone Encounter (Signed)
New message      The pt has a appointment to see a PA on August 3 rd at 8:30 am, the pt stated to the wife on Yesterday he was getting SOB when he went from the heat outside to the wife's care yesterday the wife reports the pt has reported being SOB a couple of times this summer.  The couple is getting ready to go out of town at the end of July to [place] and the concerns for the wife is what does she need to so if the pt gets SOB while out of town.  The wife would like to speak with a nurse.

## 2016-01-15 ENCOUNTER — Ambulatory Visit: Payer: 59 | Admitting: Physician Assistant

## 2016-02-05 ENCOUNTER — Encounter (INDEPENDENT_AMBULATORY_CARE_PROVIDER_SITE_OTHER): Payer: Self-pay

## 2016-02-05 ENCOUNTER — Encounter: Payer: Self-pay | Admitting: Physician Assistant

## 2016-02-05 ENCOUNTER — Ambulatory Visit (INDEPENDENT_AMBULATORY_CARE_PROVIDER_SITE_OTHER): Payer: 59 | Admitting: Physician Assistant

## 2016-02-05 VITALS — BP 168/80 | HR 58 | Ht 72.0 in | Wt 302.4 lb

## 2016-02-05 DIAGNOSIS — R600 Localized edema: Secondary | ICD-10-CM

## 2016-02-05 DIAGNOSIS — I2510 Atherosclerotic heart disease of native coronary artery without angina pectoris: Secondary | ICD-10-CM

## 2016-02-05 DIAGNOSIS — R0602 Shortness of breath: Secondary | ICD-10-CM

## 2016-02-05 DIAGNOSIS — I5042 Chronic combined systolic (congestive) and diastolic (congestive) heart failure: Secondary | ICD-10-CM

## 2016-02-05 DIAGNOSIS — I10 Essential (primary) hypertension: Secondary | ICD-10-CM | POA: Diagnosis not present

## 2016-02-05 DIAGNOSIS — E785 Hyperlipidemia, unspecified: Secondary | ICD-10-CM

## 2016-02-05 DIAGNOSIS — G4730 Sleep apnea, unspecified: Secondary | ICD-10-CM

## 2016-02-05 LAB — BASIC METABOLIC PANEL
BUN: 33 mg/dL — ABNORMAL HIGH (ref 7–25)
CHLORIDE: 110 mmol/L (ref 98–110)
CO2: 21 mmol/L (ref 20–31)
Calcium: 10.2 mg/dL (ref 8.6–10.3)
Creat: 3.08 mg/dL — ABNORMAL HIGH (ref 0.70–1.25)
Glucose, Bld: 59 mg/dL — ABNORMAL LOW (ref 65–99)
Potassium: 4 mmol/L (ref 3.5–5.3)
Sodium: 142 mmol/L (ref 135–146)

## 2016-02-05 NOTE — Patient Instructions (Signed)
Medication Instructions:  Your physician recommends that you continue on your current medications as directed. Please refer to the Current Medication list given to you today.   Labwork: Bmet, Bnp today  Testing/Procedures: Your physician has requested that you have an echocardiogram. Echocardiography is a painless test that uses sound waves to create images of your heart. It provides your doctor with information about the size and shape of your heart and how well your heart's chambers and valves are working. This procedure takes approximately one hour. There are no restrictions for this procedure.    Follow-Up: Your physician recommends that you schedule a follow-up appointment in: 3 months with Dr.Smith   Any Other Special Instructions Will Be Listed Below (If Applicable). Your physician has requested that you regularly monitor and record your blood pressure readings at home. Please use the same machine at the same time of day to check your readings and record them. Call the office if your blood pressure is running consistently >140/90      If you need a refill on your cardiac medications before your next appointment, please call your pharmacy.

## 2016-02-05 NOTE — Progress Notes (Signed)
Cardiology Office Note    Date:  02/05/2016   ID:  Matthew Wood 1950/10/14, MRN [id number]  PCP:  Matthew Blocker, MD  Cardiologist:  Dr. Tamala Wood  Chief Complaint: 12 Months follow up  History of Present Illness:   Matthew Wood is a 65 y.o. male with hx of CAD, chronic combined systolic and diastolic heart failure, HTN, HLD, LBBB, multinodular goiter, OSA on CPAP, DM, CVA and CKD stage III-IVwho presented for yearly follow up.   Echo 02/09/10 showed LV EF of 45 to 50%, mild AR and mild MR.   LHC 04/27/10 for decreased LV function and recent stroke: left main patent, 30-40% LAD, mid circumflex 30%, 80% mid RCA accounting for the appearance of an infract/peri-infract ischemia on nuclear testing, LV EF of 30-45%--> medical therapy recommended to avoid further contrast use that may impair renal function. Plan was to consider intervention on the right coronary if patient develops symptoms.   The patient was doing well on cardiac stand point when last seen by Dr. Tamala Wood 10/31/14. He was very inactive at that time and weighing nearly 300 pounds.  Recent 01/15/16 HgbA1c increased to 8.6 --> insulin adjusted.   Present today for follow up. Noted gradual worsening of dyspnea with and without exertion. Admit to having orthopnea and LE edema. ?PND. No syncope. No palpitations, melena, blood in his stool or urine. No recent illness. Mostly inactive. He is going to retire end of this month. 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 He eating mostly outside at [facility]. He is planning to be more active and to loss weight. Compliant with CPAP.    Past Medical History:  Diagnosis Date  . CHF (congestive heart failure) ([place])   . Coronary artery disease    a. LHC 04/27/10 left main patent, 30-40% LAD, mid circumflex 30%, 80% mid RCA accounting for the appearance of an infract/peri-infract ischemia on nuclear testing, LV EF of 30-45%--> medical therapy  . CVA (cerebrovascular accident) ([place]) 2011   potine  . Diabetes ([place])   .  Gout   . Hyperlipidemia   . Hypertension   . LBBB (left bundle branch block)   . Multinodular goiter   . Obesity   . OSA (obstructive sleep apnea)   . Renal insufficiency   . Stroke [facility])     Past Surgical History:  Procedure Laterality Date  . CHOLECYSTECTOMY    . COLONOSCOPY WITH PROPOFOL N/A 04/16/2014   Procedure: COLONOSCOPY WITH PROPOFOL;  Surgeon: Matthew Fair, MD;  Location: [facility];  Service: Endoscopy;  Laterality: N/A;    Current Medications: Prior to Admission medications   Medication Sig Start Date End Date Taking? Authorizing Provider  allopurinol (ZYLOPRIM) 300 MG tablet Take 300 mg by mouth daily.    Historical Provider, MD  amLODipine-benazepril (LOTREL) 10-40 MG per capsule Take 1 capsule by mouth every morning.     Historical Provider, MD  carvedilol (COREG) 25 MG tablet Take 25 mg by mouth 2 (two) times daily with a meal.    Historical Provider, MD  cloNIDine (CATAPRES) 0.2 MG tablet Take 0.2-1 mg by mouth 2 (two) times daily.     Historical Provider, MD  furosemide (LASIX) 40 MG tablet Take 40 mg by mouth 2 (two) times daily.    Historical Provider, MD  glimepiride (AMARYL) 4 MG tablet Take 4 mg by mouth 2 (two) times daily.    Historical Provider, MD  insulin glargine (LANTUS) 100 UNIT/ML injection Inject 33 Units into the skin  at bedtime.     Historical Provider, MD  insulin lispro (HUMALOG) 100 UNIT/ML injection Inject 12-16 Units into the skin 3 (three) times daily before meals.     Historical Provider, MD  isosorbide-hydrALAZINE (BIDIL) 20-37.5 MG per tablet Take 1 tablet by mouth 2 (two) times daily.    Historical Provider, MD  NON FORMULARY cpap as directed    Historical Provider, MD  potassium chloride (K-DUR,KLOR-CON) 10 MEQ tablet Take 10 mEq by mouth 2 (two) times daily.    Historical Provider, MD  rosuvastatin (CRESTOR) 20 MG tablet Take 20 mg by mouth daily.    Historical Provider, MD  saxagliptin HCl (ONGLYZA) 5 MG TABS tablet Take 5 mg by  mouth daily.    Historical Provider, MD    Allergies:   Review of patient's allergies indicates no known allergies.   Social History   Social History  . Marital status: Married    Spouse name: N/A  . Number of children: 1  . Years of education: N/A   Occupational History  . [employer] [place] Topics  . Smoking status: Former Smoker    Quit date: 03/25/2010  . Smokeless tobacco: None     Comment: 1/2 ppd  . Alcohol use Yes     Comment: occassionally  . Drug use: No  . Sexual activity: Not Asked   Other Topics Concern  . None   Social History Narrative  . None     Family History:  The patient's family history includes Breast cancer in his mother; Diabetes type II in his father; Heart attack in his father; Hypertension in his mother and sister; Stroke in his father.   ROS:   Please see the history of present illness.    ROS All other systems reviewed and are negative.   PHYSICAL EXAM:   VS:  BP (!) 168/80   Pulse (!) 58   Ht 6' (1.829 m)   Wt (!) 302 lb 6.4 oz (137.2 kg)   BMI 41.01 kg/m    GEN: Well nourished, well developed, in no acute distress  HEENT: normal  Neck: no JVD, carotid bruits, or masses Cardiac: RRR; no murmurs, rubs, or gallops. 2+ BL LE edema Respiratory:  clear to auscultation bilaterally, normal work of breathing GI: soft, nontender, distended, + BS MS: no deformity or atrophy  Skin: warm and dry, no rash Neuro:  Alert and Oriented x 3, Strength and sensation are intact Psych: euthymic mood, full affect  Wt Readings from Last 3 Encounters:  02/05/16 (!) 302 lb 6.4 oz (137.2 kg)  10/31/14 296 lb (134.3 kg)  04/16/14 296 lb (134.3 kg)      Studies/Labs Reviewed:   EKG:  EKG is ordered today.  The ekg ordered today demonstrates sinus bradycardia at rate of 58 bpm. 1st degree AV block. LBBB (old). Non specific TWI  Recent Labs from Dr. Buddy Wood  01/15/16 HgbA1c 8.1 TSH 1.74   Lipid Panel    Component  Value Date/Time   CHOL  02/08/2010 0600    171        ATP III CLASSIFICATION:  <200     mg/dL   Desirable  200-239  mg/dL   Borderline High  >=240    mg/dL   High          TRIG 248 (H) 02/08/2010 0600   HDL 34 (L) 02/08/2010 0600   CHOLHDL 5.0 02/08/2010 0600   VLDL 50 (H) 02/08/2010 0600  [place]  02/08/2010 0600    87        Total Cholesterol/HDL:CHD Risk Coronary Heart Disease Risk Table                     Men   Women  1/2 Average Risk   3.4   3.3  Average Risk       5.0   4.4  2 X Average Risk   9.6   7.1  3 X Average Risk  23.4   11.0        Use the calculated Patient Ratio above and the CHD Risk Table to determine the patient's CHD Risk.        ATP III CLASSIFICATION (LDL):  <100     mg/dL   Optimal  100-129  mg/dL   Near or Above                    Optimal  130-159  mg/dL   Borderline  160-189  mg/dL   High  >190     mg/dL   Very High    Additional studies/ records that were reviewed today include:   As above   ASSESSMENT & PLAN:   Chronic combined CHF: The patient admits to having LE edema, dyspnea and orthopnea. Advise to loose weight and salt restriction. Try compression stocking and elevates legs. Hopefully with life style changes edema will improve. Lungs clear. Will update echo. BMET and BNP today.   *Weigh yourself on the same scale at same time of day and keep a log. *Report weight gain of > 2 lbs in 1 day or 5 lbs over the course of a week and/or symptoms of excess fluid (shortness of breath, difficulty lying flat, swelling, poor appetite, abdominal fullness/bloating, etc) to your doctor immediately. *Avoid foods that are high in sodium (processed, pre-packaged/canned goods, fast foods, etc).  Coronary artery disease: No chest pain. [facility] 04/2010: eft main patent, 30-40% LAD, mid circumflex 30%, 80% mid RCA  --> treated medically. If worsening dyspnea even above intervention --> consider ischemic evaluation.   Essential hypertension: Elevated today  to 168/80. He states that he was in hurry to come here. Will keep long and give [place] a call if above 140/90.   Hyperlipidemia: Followed by primary care. Will let us know result. Continue statin.   Sleep apnea: On CPAP  Diabetes mellitus Type II: Followed by Dr. Buddy Wood  Chronic kidney disease, stage III-IV: Followed by Dr. Marval Regal    Medication Adjustments/Labs and Tests Ordered: Current medicines are reviewed at length with the patient today.  Concerns regarding medicines are outlined above.  Medication changes, Labs and Tests ordered today are listed in the Patient Instructions below. Patient Instructions  Medication Instructions:  Your physician recommends that you continue on your current medications as directed. Please refer to the Current Medication list given to you today.   Labwork: Bmet, Bnp today  Testing/Procedures: Your physician has requested that you have an echocardiogram. Echocardiography is a painless test that uses sound waves to create images of your heart. It provides your doctor with information about the size and shape of your heart and how well your heart's chambers and valves are working. This procedure takes approximately one hour. There are no restrictions for this procedure.    Follow-Up: Your physician recommends that you schedule a follow-up appointment in: 3 months with Dr.Smith   Any Other Special Instructions Will Be Listed Below (If Applicable). Your physician has requested  that you regularly monitor and record your blood pressure readings at home. Please use the same machine at the same time of day to check your readings and record them. Call the office if your blood pressure is running consistently >140/90      If you need a refill on your cardiac medications before your next appointment, please call your pharmacy.      Jarrett Soho, [place]  02/05/2016 2:48 PM    [facility] [address],  [place], [place]  [address] Phone: [phone]; Fax: [phone]

## 2016-02-06 LAB — BRAIN NATRIURETIC PEPTIDE: Brain Natriuretic Peptide: 274.2 pg/mL — ABNORMAL HIGH (ref <100)

## 2016-02-09 ENCOUNTER — Telehealth: Payer: Self-pay | Admitting: *Deleted

## 2016-02-09 MED ORDER — POTASSIUM CHLORIDE ER 10 MEQ PO TBCR: EXTENDED_RELEASE_TABLET | ORAL | 3 refills | Status: DC

## 2016-02-09 MED ORDER — FUROSEMIDE 40 MG PO TABS: ORAL_TABLET | ORAL | 3 refills | Status: DC

## 2016-02-09 NOTE — Telephone Encounter (Signed)
Pt wife, Charmaine, verbally permission gave to me over the phone.  She has been advised of pt lab results and to increase Lasix to 80 bid and potassium 20 bid X's 3 days and go back to normal dose, lasix 40 mg bid and potassium 10 meq bid. She has been advised that pt is to weigh himself daily and if he notices wt increase of 3 lbs in a day or 5 in a week, to call the office. She has been advised that pt is to AVOID SALT. She has been advised that we will send lab results to nephrologist, Dr. Marval Regal.  She verbalized understanding and appreciation.

## 2016-02-09 NOTE — Telephone Encounter (Signed)
-----   Message from [place], [place] sent at 02/06/2016 12:00 PM EDT ----- Minimally elevated fluid marker (BNP) also elevated kidney function to 3.08 (however this could be this new normal now that he has CKD stage IV). Forward BMET result to his nephrologist  Dr. Marval Regal. Increase lasix to 80mg  BID and Kdur 29meq BID x 3 days. Then back to normal/current dose.   Weigh yourself on the same scale at same time of day and keep a log. Report weight gain of > 3 lbs in 1 day or 5 lbs over the course of a week and/or symptoms of excess fluid (shortness of breath, difficulty lying flat, swelling, poor appetite, abdominal fullness/bloating, etc) to your doctor immediately. Avoid salt.

## 2016-02-19 ENCOUNTER — Ambulatory Visit (HOSPITAL_COMMUNITY): Payer: 59 | Attending: Cardiology

## 2016-02-19 ENCOUNTER — Other Ambulatory Visit: Payer: Self-pay

## 2016-02-19 DIAGNOSIS — I313 Pericardial effusion (noninflammatory): Secondary | ICD-10-CM | POA: Insufficient documentation

## 2016-02-19 DIAGNOSIS — I517 Cardiomegaly: Secondary | ICD-10-CM | POA: Diagnosis not present

## 2016-02-19 DIAGNOSIS — I5022 Chronic systolic (congestive) heart failure: Secondary | ICD-10-CM | POA: Insufficient documentation

## 2016-02-19 DIAGNOSIS — I77810 Thoracic aortic ectasia: Secondary | ICD-10-CM | POA: Diagnosis not present

## 2016-02-19 DIAGNOSIS — I447 Left bundle-branch block, unspecified: Secondary | ICD-10-CM | POA: Diagnosis not present

## 2016-02-19 DIAGNOSIS — I351 Nonrheumatic aortic (valve) insufficiency: Secondary | ICD-10-CM | POA: Diagnosis not present

## 2016-02-19 DIAGNOSIS — I2510 Atherosclerotic heart disease of native coronary artery without angina pectoris: Secondary | ICD-10-CM | POA: Diagnosis not present

## 2016-02-19 DIAGNOSIS — I340 Nonrheumatic mitral (valve) insufficiency: Secondary | ICD-10-CM | POA: Insufficient documentation

## 2016-02-19 DIAGNOSIS — G4733 Obstructive sleep apnea (adult) (pediatric): Secondary | ICD-10-CM | POA: Diagnosis not present

## 2016-02-19 DIAGNOSIS — N189 Chronic kidney disease, unspecified: Secondary | ICD-10-CM | POA: Diagnosis not present

## 2016-02-19 DIAGNOSIS — R600 Localized edema: Secondary | ICD-10-CM | POA: Diagnosis not present

## 2016-02-19 DIAGNOSIS — Z8673 Personal history of transient ischemic attack (TIA), and cerebral infarction without residual deficits: Secondary | ICD-10-CM | POA: Insufficient documentation

## 2016-02-19 DIAGNOSIS — R0602 Shortness of breath: Secondary | ICD-10-CM | POA: Diagnosis not present

## 2016-02-21 ENCOUNTER — Encounter: Payer: Self-pay | Admitting: Interventional Cardiology

## 2016-02-21 DIAGNOSIS — I5022 Chronic systolic (congestive) heart failure: Secondary | ICD-10-CM | POA: Insufficient documentation

## 2016-03-09 ENCOUNTER — Other Ambulatory Visit (HOSPITAL_COMMUNITY): Payer: Self-pay | Admitting: *Deleted

## 2016-03-10 ENCOUNTER — Ambulatory Visit (HOSPITAL_COMMUNITY)
Admission: RE | Admit: 2016-03-10 | Discharge: 2016-03-10 | Disposition: A | Discharge status: Home / Self Care | Payer: 59 | Source: Ambulatory Visit | Attending: Nephrology | Admitting: Nephrology

## 2016-03-10 DIAGNOSIS — D509 Iron deficiency anemia, unspecified: Secondary | ICD-10-CM | POA: Insufficient documentation

## 2016-03-10 MED ORDER — SODIUM CHLORIDE 0.9 % IV SOLN
510.0000 mg | INTRAVENOUS | Status: DC
  Administered 2016-03-10: 510 mg via INTRAVENOUS
  Filled 2016-03-10: qty 17

## 2016-03-10 NOTE — Discharge Instructions (Signed)

## 2016-03-17 ENCOUNTER — Ambulatory Visit (INDEPENDENT_AMBULATORY_CARE_PROVIDER_SITE_OTHER): Payer: 59 | Admitting: Cardiology

## 2016-03-17 ENCOUNTER — Institutional Professional Consult (permissible substitution): Payer: 59 | Admitting: Cardiology

## 2016-03-17 ENCOUNTER — Other Ambulatory Visit (HOSPITAL_COMMUNITY): Payer: Self-pay | Admitting: *Deleted

## 2016-03-17 ENCOUNTER — Encounter: Payer: Self-pay | Admitting: Cardiology

## 2016-03-17 VITALS — BP 128/74 | HR 59 | Ht 72.0 in | Wt 301.0 lb

## 2016-03-17 DIAGNOSIS — I255 Ischemic cardiomyopathy: Secondary | ICD-10-CM

## 2016-03-17 NOTE — Progress Notes (Signed)
Electrophysiology Office Note   Date:  03/17/2016   ID:  Matthew Wood, Matthew Wood, Matthew Wood, Matthew Wood  PCP:  Rogers Blocker, MD  Cardiologist:  Tamala Julian Primary Electrophysiologist:  Khaliel Morey Meredith Leeds, MD    Chief Complaint  Patient presents with  . Advice Only    discuss AICD  . Shortness of Breath     History of Present Illness: Matthew Wood is a 65 y.o. male who presents today for electrophysiology evaluation.   Hx CAD, chronic combined systolic and diastolic heart failure, HTN, HLD, LBBB, multinodular goiter, OSA on CPAP, DM, CVA and CKD stage III-IV.   LHC 04/27/10 for decreased LV function and recent stroke: left main patent, 30-40% LAD, mid circumflex 30%, 80% mid RCA accounting for the appearance of an infract/peri-infract ischemia on nuclear testing, LV EF of 30-45%--> medical therapy recommended to avoid further contrast use that may impair renal function.   Today, he denies symptoms of palpitations, chest pain, orthopnea, PND, lower extremity edema, claudication, dizziness, presyncope, syncope, bleeding, or neurologic sequela. The patient is tolerating medications without difficulties and is otherwise without complaint today.   He says that he does get short of breath when he exerts himself. He does not have PND or orthopnea, but does wear CPAP for his sleep apnea. He says that he can walk on flat ground and wheezes when he exerts himself, but would get short of breath when he walked up a flight of stairs.   Past Medical History:  Diagnosis Date  . CHF (congestive heart failure) ([place])   . Coronary artery disease    a. LHC 04/27/10 left main patent, 30-40% LAD, mid circumflex 30%, 80% mid RCA accounting for the appearance of an infract/peri-infract ischemia on nuclear testing, LV EF of 30-45%--> medical therapy  . CVA (cerebrovascular accident) ([place]) 2011   potine  . Diabetes ([place])   . Gout   . Hyperlipidemia   . Hypertension   . LBBB (left bundle branch block)     . Multinodular goiter   . Obesity   . OSA (obstructive sleep apnea)   . Renal insufficiency   . Stroke [facility])    Past Surgical History:  Procedure Laterality Date  . CHOLECYSTECTOMY    . COLONOSCOPY WITH PROPOFOL N/A 04/16/2014   Procedure: COLONOSCOPY WITH PROPOFOL;  Surgeon: [name], MD;  [facility];  Service: Endoscopy;  Laterality: N/A;     Current Outpatient Prescriptions  Medication Sig Dispense Refill  . allopurinol (ZYLOPRIM) 300 MG tablet Take 300 mg by mouth daily.    Marland Kitchen amLODipine-benazepril (LOTREL) 10-40 MG per capsule Take 1 capsule by mouth every morning.     . Calcifediol (RAYALDEE PO) Take 1 tablet by mouth daily.    . carvedilol (COREG) 25 MG tablet Take 25 mg by mouth 2 (two) times daily with a meal.    . cloNIDine (CATAPRES) 0.2 MG tablet Take 0.2-1 mg by mouth 2 (two) times daily.     . furosemide (LASIX) 40 MG tablet Take 2 tablets by mouth twice a day X's 3 days then go back to 1 tablet by mouth twice a day 186 tablet 3  . furosemide (LASIX) 40 MG tablet Take 40 mg by mouth 2 (two) times daily.    Marland Kitchen glimepiride (AMARYL) 4 MG tablet Take 4 mg by mouth 2 (two) times daily.    . Insulin Glargine (BASAGLAR KWIKPEN) 100 UNIT/ML SOPN Inject 40 Units into the skin at bedtime.    Marland Kitchen  insulin lispro (HUMALOG) 100 UNIT/ML injection Inject 12-16 Units into the skin 3 (three) times daily before meals.     . isosorbide-hydrALAZINE (BIDIL) 20-37.5 MG per tablet Take 1 tablet by mouth 2 (two) times daily.    . NON FORMULARY cpap as directed    . potassium chloride (K-DUR,KLOR-CON) 10 MEQ tablet Take 10 mEq by mouth 2 (two) times daily.    . rosuvastatin (CRESTOR) 20 MG tablet Take 20 mg by mouth daily.    . saxagliptin HCl (ONGLYZA) 5 MG TABS tablet Take 5 mg by mouth daily.     No current facility-administered medications for this visit.     Allergies:   Review of patient's allergies indicates no known allergies.   Social History:  The patient  reports  that he quit smoking about 5 years ago. He does not have any smokeless tobacco history on file. He reports that he drinks alcohol. He reports that he does not use drugs.   Family History:  The patient's family history includes Breast cancer in his mother; Diabetes type II in his father; Heart attack in his father; Hypertension in his mother and sister; Stroke in his father.    ROS:  Please see the history of present illness.   Otherwise, review of systems is positive for SOB.   All other systems are reviewed and negative.    PHYSICAL EXAM: VS:  BP 128/74   Pulse (!) 59   Ht 6' (1.829 m)   Wt (!) 301 lb (136.5 kg)   BMI 40.82 kg/m  , BMI Body mass index is 40.82 kg/m. GEN: Well nourished, well developed, in no acute distress  HEENT: normal  Neck: no JVD, carotid bruits, or masses Cardiac:  no murmurs, rubs, or gallops,no edema  Respiratory:  clear to auscultation bilaterally, normal work of breathing GI: soft, nontender, nondistended, + BS MS: no deformity or atrophy  Skin: warm and dry Neuro:  Strength and sensation are intact Psych: euthymic mood, full affect  EKG:  EKG is ordered today. Personal review of the ekg ordered shows sinus rhythm, rate 59, left bundle branch block, QRS 144 ms  Recent Labs: 02/05/2016: Brain Natriuretic Peptide 274.2; BUN 33; Creat 3.08; Potassium 4.0; Sodium 142    Lipid Panel     Component Value Date/Time   CHOL  02/08/2010 0600    171        ATP III CLASSIFICATION:  <200     mg/dL   Desirable  200-239  mg/dL   Borderline High  >=240    mg/dL   High          TRIG 248 (H) 02/08/2010 0600   HDL 34 (L) 02/08/2010 0600   CHOLHDL 5.0 02/08/2010 0600   VLDL 50 (H) 02/08/2010 0600   LDLCALC  02/08/2010 0600    87        Total Cholesterol/HDL:CHD Risk Coronary Heart Disease Risk Table                     Men   Women  1/2 Average Risk   3.4   3.3  Average Risk       5.0   4.4  2 X Average Risk   9.6   7.1  3 X Average Risk  23.4   11.0         Use the calculated Patient Ratio above and the CHD Risk Table to determine the patient's CHD Risk.  ATP III CLASSIFICATION (LDL):  <100     mg/dL   Optimal  100-129  mg/dL   Near or Above                    Optimal  130-159  mg/dL   Borderline  160-189  mg/dL   High  >190     mg/dL   Very High     Wt Readings from Last 3 Encounters:  10/Wood/17 (!) 301 lb (136.5 kg)  03/10/16 300 lb (136.1 kg)  02/05/16 (!) 302 lb 6.4 oz (137.2 kg)      Other studies Reviewed: Additional studies/ records that were reviewed today include: TTE 02/19/16  Review of the above records today demonstrates:  - Left ventricle: The cavity size was moderately dilated. Wall   thickness was increased in a pattern of mild LVH. Systolic   function was moderately to severely reduced. The estimated   ejection fraction was in the range of 30% to 35%. Diffuse   hypokinesis. Features are consistent with a pseudonormal left   ventricular filling pattern, with concomitant abnormal relaxation   and increased filling pressure (grade 2 diastolic dysfunction). - Aortic valve: There was no stenosis. There was mild   regurgitation. - Aorta: Mildly dilated aortic root and ascending aorta. Aortic   root dimension: 39 mm (ED). Ascending aortic diameter: 42 mm (S). - Mitral valve: There was mild regurgitation. - Left atrium: The atrium was moderately dilated. - Right ventricle: The cavity size was normal. Systolic function   was normal. - Right atrium: The atrium was mildly dilated. - Pulmonary arteries: No complete TR doppler jet so unable to   estimate PA systolic pressure. - Systemic veins: IVC not fully visualized. - Pericardium, extracardiac: A trivial pericardial effusion was   identified posterior to the heart.   ASSESSMENT AND PLAN:  Chronic combined CHF: Most recent echocardiogram showed a ejection fraction of 30-35%. He is on optimal medical therapy and therefore doesn't qualify for defibrillator  placement. He also has a left bundle-branch block and would therefore be a CRT candidate. I discussed with him the risks and benefits of CRT D placed. Risks include bleeding, infection, tamponade, and pneumothorax. He understands these risks and has agreed to the procedure, but would like to delay until the start of next year. He Lelend Heinecke require contrast with the procedure. Jalessa Peyser see him back in December to discuss further.  Coronary artery disease: No chest pain. [facility] 04/2010: eft main patent, 30-40% LAD, mid circumflex 30%, 80% mid RCA  --> treated medically.   Essential hypertension:  well controlled today  Hyperlipidemia: Continue statin.   Sleep apnea:On CPAP  Diabetes mellitus Type II: Followed by Dr. Buddy Duty  Chronic kidney disease, stage III-IV: Followed by Dr. Marval Regal    Current medicines are reviewed at length with the patient today.   The patient does not have concerns regarding his medicines.  The following changes were made today:  none  Labs/ tests ordered today include:  Orders Placed This Encounter  Procedures  . EKG 12-Lead     Disposition:   FU with Leyna Vanderkolk 3 months  Signed, [employer] Null Meredith Leeds, MD  03/17/2016 3:57 PM     [facility] [address] [place] [address] [phone] (office) [phone] (fax)

## 2016-03-17 NOTE — Patient Instructions (Addendum)
Medication Instructions:    Your physician recommends that you continue on your current medications as directed. Please refer to the Current Medication list given to you today.  --- If you need a refill on your cardiac medications before your next appointment, please call your pharmacy. ---  Labwork:  None ordered  Testing/Procedures:  None ordered  Follow-Up:  Your physician recommends you follow up with him on 05/19/2016 @ 3:45 at [facility].   Thank you for choosing [facility]!!   Trinidad Curet, RN [phone]

## 2016-03-18 ENCOUNTER — Encounter (HOSPITAL_COMMUNITY)
Admission: RE | Admit: 2016-03-18 | Discharge: 2016-03-18 | Disposition: A | Discharge status: Home / Self Care | Payer: 59 | Source: Ambulatory Visit | Attending: Nephrology | Admitting: Nephrology

## 2016-03-18 DIAGNOSIS — D509 Iron deficiency anemia, unspecified: Secondary | ICD-10-CM | POA: Insufficient documentation

## 2016-03-18 MED ORDER — SODIUM CHLORIDE 0.9 % IV SOLN
510.0000 mg | INTRAVENOUS | Status: AC
  Administered 2016-03-18: 510 mg via INTRAVENOUS
  Filled 2016-03-18: qty 17

## 2016-04-05 ENCOUNTER — Encounter: Payer: Self-pay | Admitting: Pulmonary Disease

## 2016-04-05 ENCOUNTER — Ambulatory Visit (INDEPENDENT_AMBULATORY_CARE_PROVIDER_SITE_OTHER): Payer: 59 | Admitting: Pulmonary Disease

## 2016-04-05 DIAGNOSIS — G4733 Obstructive sleep apnea (adult) (pediatric): Secondary | ICD-10-CM

## 2016-04-05 NOTE — Patient Instructions (Signed)
We will try to obtain a copy of your sleep study, current settings and download report from [place] a  Based on this you may need a repeat titration study Then we will set you up with a new CPAP machine  Call us with problems

## 2016-04-05 NOTE — Progress Notes (Signed)
Subjective:    Patient ID: Matthew Wood, male    DOB: Apr 07, 1951, 65 y.o.   MRN: [id number]  HPI   Chief Complaint  Patient presents with  . sleep consult    per Dr. Randall Hiss Dean.wears cpap 8-9hr. feels pressure & mask are ok. LKT:GYBWL.     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 This was diagnosed more than 10 years ago and he has been maintained on CPAP with full face mask since then, DME is [employer]. He had good results with improvement in his fatigue daytime somnolence and snoring as per his wife. His last machine is about 31 years old and he would like a new machine. He is very particular about using his CPAP and wife has not noted snoring on this.  Epworth sleepiness score is 16  Bedtime is between 10 and 11 PM, sleep latency is about 20 minutes, he sleeps on his back with 2 pillows, reports 2-3 bathroom visits including nocturnal awakenings and is out of bed by 10 AM feeling rested with occasional dryness of mouth but denies headaches. He does not know his humidifier settings. He is gained about 10 pounds in the last 2-3 years  There is no history suggestive of cataplexy, sleep paralysis or parasomnias  He has an EF of 35% and defibrillator is being discussed by his cardiologist    Past Medical History:  Diagnosis Date  . CHF (congestive heart failure) ([place])   . Coronary artery disease    a. LHC 04/27/10 left main patent, 30-40% LAD, mid circumflex 30%, 80% mid RCA accounting for the appearance of an infract/peri-infract ischemia on nuclear testing, LV EF of 30-45%--> medical therapy  . CVA (cerebrovascular accident) ([place]) 2011   potine  . Diabetes ([place])   . Gout   . Hyperlipidemia   . Hypertension   . LBBB (left bundle branch block)   . Multinodular goiter   . Obesity   . OSA (obstructive sleep apnea)   . Renal insufficiency   . Stroke [facility])      Past Surgical  History:  Procedure Laterality Date  . CHOLECYSTECTOMY    . COLONOSCOPY WITH PROPOFOL N/A 04/16/2014   Procedure: COLONOSCOPY WITH PROPOFOL;  Surgeon: [name], MD;  Location: [facility];  Service: Endoscopy;  Laterality: N/A;     No Known Allergies   Social History   Social History  . Marital status: Married    Spouse name: N/A  . Number of children: 1  . 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 Smoking status: Former Smoker    Quit date: 03/25/2010  . Smokeless tobacco: Not on file     Comment: 1/2 ppd  . Alcohol use Yes     Comment: occassionally  . Drug use: No  . Sexual activity: Not on file   Other Topics Concern  . Not on file   Social History Narrative  . No narrative on file     Family History  Problem Relation Age of Onset  . Hypertension Mother   . Breast cancer Mother   . Heart attack Father   . Stroke Father   . Diabetes type II Father   . Hypertension Sister       Review of Systems neg for any significant sore throat, dysphagia, itching, sneezing, nasal congestion or excess/ purulent  secretions, fever, chills, sweats, unintended wt loss, pleuritic or exertional cp, hempoptysis, orthopnea pnd or change in chronic leg swelling.   Also denies presyncope, palpitations, heartburn, abdominal pain, nausea, vomiting, diarrhea or change in bowel or urinary habits, dysuria,hematuria, rash, arthralgias, visual complaints, headache, numbness weakness or ataxia.     Objective:   Physical Exam   Gen. Pleasant, obese, in no distress, normal affect ENT - no lesions, no post nasal drip, class 2-3 airway Neck: No JVD, no thyromegaly, no carotid bruits Lungs: no use of accessory muscles, no dullness to percussion, decreased without rales or rhonchi  Cardiovascular: Rhythm regular, heart sounds  normal, no murmurs or gallops, 2+ peripheral edema Abdomen: soft and non-tender, no  hepatosplenomegaly, BS normal. Musculoskeletal: No deformities, no cyanosis or clubbing Neuro:  alert, non focal, no tremors        Assessment & Plan:

## 2016-04-05 NOTE — Assessment & Plan Note (Signed)
We will try to obtain a copy of your sleep study, current settings and download report from [facility]  Based on this you may need a repeat titration study - will need attended polysomnogram in view of his chronic comorbidities Then we will set you up with a new CPAP machine  Call us with problems   The pathophysiology of obstructive sleep apnea , it's cardiovascular consequences & modes of treatment including CPAP were discused with the patient in detail & they evidenced understanding.

## 2016-05-03 ENCOUNTER — Encounter: Payer: Self-pay | Admitting: Pulmonary Disease

## 2016-05-13 ENCOUNTER — Ambulatory Visit (INDEPENDENT_AMBULATORY_CARE_PROVIDER_SITE_OTHER): Payer: 59 | Admitting: Interventional Cardiology

## 2016-05-13 ENCOUNTER — Encounter: Payer: Self-pay | Admitting: Interventional Cardiology

## 2016-05-13 ENCOUNTER — Encounter (INDEPENDENT_AMBULATORY_CARE_PROVIDER_SITE_OTHER): Payer: Self-pay

## 2016-05-13 VITALS — BP 132/80 | HR 58 | Ht 72.0 in | Wt 302.8 lb

## 2016-05-13 DIAGNOSIS — E785 Hyperlipidemia, unspecified: Secondary | ICD-10-CM

## 2016-05-13 DIAGNOSIS — I10 Essential (primary) hypertension: Secondary | ICD-10-CM

## 2016-05-13 DIAGNOSIS — I2510 Atherosclerotic heart disease of native coronary artery without angina pectoris: Secondary | ICD-10-CM

## 2016-05-13 DIAGNOSIS — I5022 Chronic systolic (congestive) heart failure: Secondary | ICD-10-CM | POA: Diagnosis not present

## 2016-05-13 DIAGNOSIS — I639 Cerebral infarction, unspecified: Secondary | ICD-10-CM

## 2016-05-13 DIAGNOSIS — I6350 Cerebral infarction due to unspecified occlusion or stenosis of unspecified cerebral artery: Secondary | ICD-10-CM | POA: Diagnosis not present

## 2016-05-13 DIAGNOSIS — G4730 Sleep apnea, unspecified: Secondary | ICD-10-CM

## 2016-05-13 NOTE — Patient Instructions (Signed)
Medication Instructions:  None  Labwork: None  Testing/Procedures: None  Follow-Up: Your physician wants you to follow-up in: 6 months with Dr. Smith.  You will receive a reminder letter in the mail two months in advance. If you don't receive a letter, please call our office to schedule the follow-up appointment.   Any Other Special Instructions Will Be Listed Below (If Applicable).     If you need a refill on your cardiac medications before your next appointment, please call your pharmacy.   

## 2016-05-13 NOTE — Progress Notes (Signed)
Cardiology Office Note    Date:  05/13/2016   ID:  Matthew, Wood 1951/01/12, MRN [id number]  PCP:  Matthew Blocker, MD  Cardiologist: Matthew Grooms, MD   Chief Complaint  Patient presents with  . Congestive Heart Failure    History of Present Illness:  Matthew CHRISTIANA is a 65 y.o. male with hx of CAD, chronic combined systolic and diastolic heart failure, HTN, HLD, LBBB, multinodular goiter, OSA on CPAP, DM, CVA and CKD stage III-IV. Recently diagnosed to have ejection fraction by echo in the 30-35% range in the setting of ischemic cardiomyopathy.   [name] is doing okay. He denies orthopnea and PND. He cannot sleep without C Pap. He denies angina. He is on his medications as prescribed. I referred him to EP to consider by the ICD. He saw Dr. Curt Wood. Apparently kidney function is stable.  He has not had palpitations or syncope.    Past Medical History:  Diagnosis Date  . CHF (congestive heart failure) ([place])   . Coronary artery disease    a. LHC 04/27/10 left main patent, 30-40% LAD, mid circumflex 30%, 80% mid RCA accounting for the appearance of an infract/peri-infract ischemia on nuclear testing, LV EF of 30-45%--> medical therapy  . CVA (cerebrovascular accident) ([place]) 2011   potine  . Diabetes ([place])   . Gout   . Hyperlipidemia   . Hypertension   . LBBB (left bundle branch block)   . Multinodular goiter   . Obesity   . OSA (obstructive sleep apnea)   . Renal insufficiency   . Stroke [facility])     Past Surgical History:  Procedure Laterality Date  . CHOLECYSTECTOMY    . COLONOSCOPY WITH PROPOFOL N/A 04/16/2014   Procedure: COLONOSCOPY WITH PROPOFOL;  Surgeon: [name], MD;  Location[facility];  Service: Endoscopy;  Laterality: N/A;    Current Medications: Outpatient Medications Prior to Visit  Medication Sig Dispense Refill  . allopurinol (ZYLOPRIM) 300 MG tablet Take 300 mg by mouth daily.    Marland Kitchen amLODipine-benazepril (LOTREL) 10-40 MG per  capsule Take 1 capsule by mouth every morning.     . Calcifediol (RAYALDEE PO) Take 1 tablet by mouth daily.    . carvedilol (COREG) 25 MG tablet Take 25 mg by mouth 2 (two) times daily with a meal.    . cloNIDine (CATAPRES) 0.2 MG tablet Take 0.2-1 mg by mouth 2 (two) times daily.     Marland Kitchen glimepiride (AMARYL) 4 MG tablet Take 4 mg by mouth 2 (two) times daily.    . Insulin Glargine (BASAGLAR KWIKPEN) 100 UNIT/ML SOPN Inject 40 Units into the skin at bedtime.    . insulin lispro (HUMALOG) 100 UNIT/ML injection Inject 20 Units into the skin 3 (three) times daily before meals.     . isosorbide-hydrALAZINE (BIDIL) 20-37.5 MG per tablet Take 1 tablet by mouth 2 (two) times daily.    . NON FORMULARY cpap as directed    . potassium chloride (K-DUR,KLOR-CON) 10 MEQ tablet Take 10 mEq by mouth 2 (two) times daily.    . rosuvastatin (CRESTOR) 20 MG tablet Take 20 mg by mouth daily.    . saxagliptin HCl (ONGLYZA) 5 MG TABS tablet Take 5 mg by mouth daily.    . furosemide (LASIX) 40 MG tablet Take 2 tablets by mouth twice a day X's 3 days then go back to 1 tablet by mouth twice a day (Patient not taking: Reported on 05/13/2016) 186 tablet  3  . furosemide (LASIX) 40 MG tablet Take 40 mg by mouth 2 (two) times daily.     No facility-administered medications prior to visit.      Allergies:   Patient has no known allergies.   Social History   Social History  . Marital status: Married    Spouse name: N/A  . Number of children: 1  . 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 Smoking status: Former Smoker    Quit date: 03/25/2010  . Smokeless tobacco: Never Used     Comment: 1/2 ppd  . Alcohol use Yes     Comment: occassionally  . Drug use: No  . Sexual activity: Not Asked   Other Topics Concern  . None   Social History Narrative  . None     Family History:  The patient's family history includes Breast cancer in his  mother; Diabetes type II in his father; Heart attack in his father; Hypertension in his mother and sister; Stroke in his father.   ROS:   Please see the history of present illness.    He has no complaints other than back discomfort and weight gain. She is a been difficult to control.  All other systems reviewed and are negative.   PHYSICAL EXAM:   VS:  BP 132/80 (BP Location: Right Arm)   Pulse (!) 58   Ht 6' (1.829 m)   Wt (!) 302 lb 12.8 oz (137.3 kg)   BMI 41.07 kg/m    GEN: Well nourished, well developed, in no acute distress. Morbid obesity.  HEENT: normal  Neck: no JVD, carotid bruits, or masses Cardiac: RRR; no murmurs, rubs. S4 gallop is audible. no edema.  Respiratory:  clear to auscultation bilaterally, normal work of breathing GI: soft, nontender, nondistended, + BS MS: no deformity or atrophy  Skin: warm and dry, no rash Neuro:  Alert and Oriented x 3, Strength and sensation are intact Psych: euthymic mood, full affect  Wt Readings from Last 3 Encounters:  05/13/16 (!) 302 lb 12.8 oz (137.3 kg)  04/05/16 (!) 300 lb 6.4 oz (136.3 kg)  03/18/16 300 lb (136.1 kg)      Studies/Labs Reviewed:   EKG:  EKG  Not repeated.  Recent Labs: 02/05/2016: Brain Natriuretic Peptide 274.2; BUN 33; Creat 3.08; Potassium 4.0; Sodium 142   Lipid Panel    Component Value Date/Time   CHOL  02/08/2010 0600    171        ATP III CLASSIFICATION:  <200     mg/dL   Desirable  200-239  mg/dL   Borderline High  >=240    mg/dL   High          TRIG 248 (H) 02/08/2010 0600   HDL 34 (L) 02/08/2010 0600   CHOLHDL 5.0 02/08/2010 0600   VLDL 50 (H) 02/08/2010 0600   LDLCALC  02/08/2010 0600    87        Total Cholesterol/HDL:CHD Risk Coronary Heart Disease Risk Table                     Men   Women  1/2 Average Risk   3.4   3.3  Average Risk       5.0   4.4  2 X Average Risk   9.6   7.1  3 X Average Risk  23.4   11.0  Use the calculated Patient Ratio above and the CHD  Risk Table to determine the patient's CHD Risk.        ATP III CLASSIFICATION (LDL):  <100     mg/dL   Optimal  100-129  mg/dL   Near or Above                    Optimal  130-159  mg/dL   Borderline  160-189  mg/dL   High  >190     mg/dL   Very High    Additional studies/ records that were reviewed today include:  Echocardiogram 02/19/16: Study Conclusions   - Left ventricle: The cavity size was moderately dilated. Wall   thickness was increased in a pattern of mild LVH. Systolic   function was moderately to severely reduced. The estimated   ejection fraction was in the range of 30% to 35%. Diffuse   hypokinesis. Features are consistent with a pseudonormal left   ventricular filling pattern, with concomitant abnormal relaxation   and increased filling pressure (grade 2 diastolic dysfunction). - Aortic valve: There was no stenosis. There was mild   regurgitation. - Aorta: Mildly dilated aortic root and ascending aorta. Aortic   root dimension: 39 mm (ED). Ascending aortic diameter: 42 mm (S). - Mitral valve: There was mild regurgitation. - Left atrium: The atrium was moderately dilated. - Right ventricle: The cavity size was normal. Systolic function   was normal. - Right atrium: The atrium was mildly dilated. - Pulmonary arteries: No complete TR doppler jet so unable to   estimate PA systolic pressure. - Systemic veins: IVC not fully visualized. - Pericardium, extracardiac: A trivial pericardial effusion was   identified posterior to the heart.   Impressions:   - Moderately dilated LV with mild LV hypertrophy. EF 30-35%,   diffuse hypokinesis. Moderate diastolic dysfunction. Normal RV   size and systolic function. Mild aortic insufficiency and mild   MR.    ASSESSMENT:    1. Coronary artery disease involving native coronary artery of native heart without angina pectoris   2. Chronic systolic heart failure ([place])   3. Essential hypertension   4. Left pontine CVA ([place])    5. Sleep apnea, unspecified type   6. Hyperlipidemia, unspecified hyperlipidemia type      PLAN:  In order of problems listed above:  1. Asymptomatic with reference to angina. There is no nitroglycerin requirement at this time. 2. No evidence of volume overload. EF is less than 35%. Being considered for BiV-ICD. 3. 2 g sodium diet. Target blood pressure 130/90 mmHg or less. 4. No recurrent neurological symptoms. 5. Compliance was C Pap is discussed. 6. LDL target less than 70. Continue statin therapy.    Medication Adjustments/Labs and Tests Ordered: Current medicines are reviewed at length with the patient today.  Concerns regarding medicines are outlined above.  Medication changes, Labs and Tests ordered today are listed in the Patient Instructions below. There are no Patient Instructions on file for this visit.   Signed, Matthew Grooms, MD  05/13/2016 11:21 AM    [facility] [place], [place], [place]  [address] Phone: [phone]; Fax: [phone]

## 2016-05-19 ENCOUNTER — Encounter: Payer: Self-pay | Admitting: *Deleted

## 2016-05-19 ENCOUNTER — Other Ambulatory Visit: Payer: Self-pay | Admitting: Cardiology

## 2016-05-19 ENCOUNTER — Encounter: Payer: Self-pay | Admitting: Cardiology

## 2016-05-19 ENCOUNTER — Ambulatory Visit: Payer: 59 | Admitting: Cardiology

## 2016-05-19 ENCOUNTER — Ambulatory Visit (INDEPENDENT_AMBULATORY_CARE_PROVIDER_SITE_OTHER): Payer: 59 | Admitting: Cardiology

## 2016-05-19 VITALS — BP 132/80 | HR 58 | Ht 72.0 in | Wt 308.0 lb

## 2016-05-19 DIAGNOSIS — Z01812 Encounter for preprocedural laboratory examination: Secondary | ICD-10-CM

## 2016-05-19 DIAGNOSIS — I5022 Chronic systolic (congestive) heart failure: Secondary | ICD-10-CM

## 2016-05-19 DIAGNOSIS — I255 Ischemic cardiomyopathy: Secondary | ICD-10-CM | POA: Diagnosis not present

## 2016-05-19 NOTE — Progress Notes (Signed)
Electrophysiology Office Note   Date:  05/19/2016   ID:  Matthew, Wood [birth year], MRN [id number]  PCP:  Matthew Blocker, MD  Cardiologist:  Matthew Wood Primary Electrophysiologist:  Matthew Wood Matthew Leeds, MD    Chief Complaint  Patient presents with  . Follow-up    ICM/Chronic systolic heart failure      History of Present Illness: Matthew Wood is a 65 y.o. male who presents today for electrophysiology evaluation.   Hx CAD, chronic combined systolic and diastolic heart failure, HTN, HLD, LBBB, multinodular goiter, OSA on CPAP, DM, CVA and CKD stage III-IV.   LHC 04/27/10 for decreased LV function and recent stroke: left main patent, 30-40% LAD, mid circumflex 30%, 80% mid RCA accounting for the appearance of an infract/peri-infract ischemia on nuclear testing, LV EF of 30-45%--> medical therapy recommended to avoid further contrast use that may impair renal function.   Today, he denies symptoms of palpitations, chest pain, orthopnea, PND, lower extremity edema, claudication, dizziness, presyncope, syncope, bleeding, or neurologic sequela. The patient is tolerating medications without difficulties and is otherwise without complaint today.   He does continue to have SOB symptoms when climbing stairs. He has been having LE edema as well. Wears CPAP and has no PND or orthopnea.   Past Medical History:  Diagnosis Date  . CHF (congestive heart failure) ([place])   . Coronary artery disease    a. LHC 04/27/10 left main patent, 30-40% LAD, mid circumflex 30%, 80% mid RCA accounting for the appearance of an infract/peri-infract ischemia on nuclear testing, LV EF of 30-45%--> medical therapy  . CVA (cerebrovascular accident) (Matthew Wood) 2011   potine  . Diabetes ([place])   . Gout   . Hyperlipidemia   . Hypertension   . LBBB (left bundle branch block)   . Multinodular goiter   . Obesity   . OSA (obstructive sleep apnea)   . Renal insufficiency   . Stroke [facility])    Past Surgical History:    Procedure Laterality Date  . CHOLECYSTECTOMY    . COLONOSCOPY WITH PROPOFOL N/A 04/16/2014   Procedure: COLONOSCOPY WITH PROPOFOL;  Surgeon: Matthew Fair, MD;  Location: [facility];  Service: Endoscopy;  Laterality: N/A;     Current Outpatient Prescriptions  Medication Sig Dispense Refill  . allopurinol (ZYLOPRIM) 300 MG tablet Take 300 mg by mouth daily.    Marland Kitchen amLODipine-benazepril (LOTREL) 10-40 MG per capsule Take 1 capsule by mouth every morning.     . Calcifediol (RAYALDEE PO) Take 1 tablet by mouth daily.    . carvedilol (COREG) 25 MG tablet Take 25 mg by mouth 2 (two) times daily with a meal.    . cloNIDine (CATAPRES) 0.2 MG tablet Take 0.2-1 mg by mouth 2 (two) times daily.     . furosemide (LASIX) 20 MG tablet Take 40 mg by mouth 2 (two) times daily.     Marland Kitchen glimepiride (AMARYL) 4 MG tablet Take 4 mg by mouth 2 (two) times daily.    . Insulin Glargine (BASAGLAR KWIKPEN) 100 UNIT/ML SOPN Inject 40 Units into the skin at bedtime.    . insulin lispro (HUMALOG) 100 UNIT/ML injection Inject 20 Units into the skin 3 (three) times daily before meals.     . isosorbide-hydrALAZINE (BIDIL) 20-37.5 MG per tablet Take 1 tablet by mouth 2 (two) times daily.    . NON FORMULARY cpap as directed    . potassium chloride (K-DUR,KLOR-CON) 10 MEQ tablet Take 10 mEq by mouth 2 (two)  times daily.    . rosuvastatin (CRESTOR) 20 MG tablet Take 20 mg by mouth daily.    . saxagliptin HCl (ONGLYZA) 5 MG TABS tablet Take 5 mg by mouth daily.     No current facility-administered medications for this visit.     Allergies:   Patient has no known allergies.   Social History:  The patient  reports that he quit smoking about 6 years ago. He has never used smokeless tobacco. He reports that he drinks alcohol. He reports that he does not use drugs.   Family History:  The patient's family history includes Breast cancer in his mother; Diabetes type II in his father; Heart attack in his father; Hypertension in  his mother and sister; Stroke in his father.    ROS:  Please see the history of present illness.   Otherwise, review of systems is positive for SOB.   All other systems are reviewed and negative.    PHYSICAL EXAM: VS:  BP 132/80   Pulse (!) 58   Ht 6' (1.829 m)   Wt (!) 308 lb (139.7 kg)   BMI 41.77 kg/m  , BMI Body mass index is 41.77 kg/m. GEN: Well nourished, well developed, in no acute distress  HEENT: normal  Neck: no JVD, carotid bruits, or masses Cardiac:  no murmurs, rubs, or gallops,no edema  Respiratory:  clear to auscultation bilaterally, normal work of breathing GI: soft, nontender, nondistended, + BS MS: no deformity or atrophy  Skin: warm and dry Neuro:  Strength and sensation are intact Psych: euthymic mood, full affect  EKG:  EKG is ordered today. Personal review of the ekg ordered shows sinus rhythm, rate 59, left bundle branch block, QRS 144 ms  Recent Labs: 02/05/2016: Brain Natriuretic Peptide 274.2; BUN 33; Creat 3.08; Potassium 4.0; Sodium 142    Lipid Panel     Component Value Date/Time   CHOL  02/08/2010 0600    171        ATP III CLASSIFICATION:  <200     mg/dL   Desirable  200-239  mg/dL   Borderline High  >=240    mg/dL   High          TRIG 248 (H) 02/08/2010 0600   HDL 34 (L) 02/08/2010 0600   CHOLHDL 5.0 02/08/2010 0600   VLDL 50 (H) 02/08/2010 0600   LDLCALC  02/08/2010 0600    87        Total Cholesterol/HDL:CHD Risk Coronary Heart Disease Risk Table                     Men   Women  1/2 Average Risk   3.4   3.3  Average Risk       5.0   4.4  2 X Average Risk   9.6   7.1  3 X Average Risk  23.4   11.0        Use the calculated Patient Ratio above and the CHD Risk Table to determine the patient's CHD Risk.        ATP III CLASSIFICATION (LDL):  <100     mg/dL   Optimal  100-129  mg/dL   Near or Above                    Optimal  130-159  mg/dL   Borderline  160-189  mg/dL   High  >190     mg/dL   Very High  Wt Readings  from Last 3 Encounters:  05/19/16 (!) 308 lb (139.7 kg)  05/13/16 (!) 302 lb 12.8 oz (137.3 kg)  04/05/16 (!) 300 lb 6.4 oz (136.3 kg)      Other studies Reviewed: Additional studies/ records that were reviewed today include: TTE 02/19/16  Review of the above records today demonstrates:  - Left ventricle: The cavity size was moderately dilated. Wall   thickness was increased in a pattern of mild LVH. Systolic   function was moderately to severely reduced. The estimated   ejection fraction was in the range of 30% to 35%. Diffuse   hypokinesis. Features are consistent with a pseudonormal left   ventricular filling pattern, with concomitant abnormal relaxation   and increased filling pressure (grade 2 diastolic dysfunction). - Aortic valve: There was no stenosis. There was mild   regurgitation. - Aorta: Mildly dilated aortic root and ascending aorta. Aortic   root dimension: 39 mm (ED). Ascending aortic diameter: 42 mm (S). - Mitral valve: There was mild regurgitation. - Left atrium: The atrium was moderately dilated. - Right ventricle: The cavity size was normal. Systolic function   was normal. - Right atrium: The atrium was mildly dilated. - Pulmonary arteries: No complete TR doppler jet so unable to   estimate PA systolic pressure. - Systemic veins: IVC not fully visualized. - Pericardium, extracardiac: A trivial pericardial effusion was   identified posterior to the heart.   ASSESSMENT AND PLAN:  Chronic combined CHF: Most recent echocardiogram showed a ejection fraction of 30-35%. He is on optimal medical therapy and therefore does qualify for defibrillator placement. He also has a left bundle-branch block and would therefore be a CRT candidate. I discussed with him the risks and benefits of CRT D placed. Risks include bleeding, infection, tamponade, and pneumothorax. He understands these risks and has agreed to the procedure. Zuleyka Kloc plan for CRT implant this month. Adarian Bur check with  his nephrologist for any changes needed as we Jamaris Theard use a small amount of contrast.  Coronary artery disease: No chest pain. [facility] 04/2010: eft main patent, 30-40% LAD, mid circumflex 30%, 80% mid RCA  --> treated medically.   Essential hypertension:  well controlled today  Hyperlipidemia: Continue statin.   Sleep apnea:On CPAP  Diabetes mellitus Type II: Followed by Dr. Buddy Duty  Chronic kidney disease, stage III-IV: Followed by Dr. Marval Regal    Current medicines are reviewed at length with the patient today.   The patient does not have concerns regarding his medicines.  The following changes were made today:  none  Labs/ tests ordered today include:  No orders of the defined types were placed in this encounter.    Disposition:   FU with Alfonzia Woolum 3 months  Signed, Carlin Mamone Matthew Leeds, MD  05/19/2016 2:55 PM     [facility] [address] [place] [address] [phone] (office) [phone] (fax)

## 2016-05-19 NOTE — Patient Instructions (Addendum)
Medication Instructions:    Your physician recommends that you continue on your current medications as directed. Please refer to the Current Medication list given to you today.  --- If you need a refill on your cardiac medications before your next appointment, please call your pharmacy. ---  Labwork:  Your physician recommends that you return for pre procedure lab work in: 05/21/16  Testing/Procedures:  Your physician has recommended that you have a defibrillator inserted. An implantable cardioverter defibrillator (ICD) is a small device that is placed in your chest or, in rare cases, your abdomen. This device uses electrical pulses or shocks to help control life-threatening, irregular heartbeats that could lead the heart to suddenly stop beating (sudden cardiac arrest). Leads are attached to the ICD that goes into your heart. This is done in the hospital and usually requires an overnight stay. Please see the instruction sheet given to you today for more information.   CRT or cardiac resynchronization therapy is a treatment used to correct heart failure. When you have heart failure your heart is weakened and doesn't pump as well as it should. This therapy may help reduce symptoms and improve the quality of life.  Please see the handout/brochure given to you today to get more information of the different options of therapy.   Follow-Up:  You are scheduled for a wound  Check appointment on 12/21/017 at 9:30 a.m. With device clinic located at [address], [place] are scheduled for a 3 month post implant appointment with Dr. Curt Bears on 09/08/16 at 1:30 pm in [place]  Thank you for choosing [facility]!!   Trinidad Curet, RN [phone]  Any Other Special Instructions Will Be Listed Below (If Applicable).  Cardioverter Defibrillator Implantation An implantable cardioverter defibrillator (ICD) is a small, lightweight, battery-powered device that is placed (implanted)  under the skin in the chest or abdomen. Your caregiver may prescribe an ICD if:  You have had an irregular heart rhythm (arrhythmia) that originated in the lower chambers of the heart (ventricles).  Your heart has been damaged by a disease (such as coronary artery disease) or heart condition (such as a heart attack). An ICD consists of a battery that lasts several years, a small computer called a pulse generator, and wires called leads that go into the heart. It is used to detect and correct two dangerous arrhythmias: a rapid heart rhythm (tachycardia) and an arrhythmia in which the ventricles contract in an uncoordinated way (fibrillation). When an ICD detects tachycardia, it sends an electrical signal to the heart that restores the heartbeat to normal (cardioversion). This signal is usually painless. If cardioversion does not work or if the ICD detects fibrillation, it delivers a small electrical shock to the heart (defibrillation) to restart the heart. The shock may feel like a strong jolt in the chest.ICDs may be programmed to correct other problems. Sometimes, ICDs are programmed to act as another type of implantable device called a pacemaker. Pacemakers are used to treat a slow heartbeat (bradycardia). LET YOUR CAREGIVER KNOW ABOUT:  Any allergies you have.  All medicines you are taking, including vitamins, herbs, eyedrops, and over-the-counter medicines and creams.  Previous problems you or members of your family have had with the use of anesthetics.  Any blood disorders you have had.  Other health problems you have. RISKS AND COMPLICATIONS Generally, the procedure to implant an ICD is safe. However, as with any surgical procedure, complications can occur. Possible complications associated with implanting  an ICD include:  Swelling, bleeding, or bruising at the site where the ICD was implanted.  Infection at the site where the ICD was implanted.  A reaction to medicine used during the  procedure.  Nerve, heart, or blood vessel damage.  Blood clots. BEFORE THE PROCEDURE  You may need to have blood tests, heart tests, or a chest X-ray done before the day of the procedure.  Ask your caregiver about changing or stopping your regular medicines.  Make plans to have someone drive you home. You may need to stay in the hospital overnight after the procedure.  Stop smoking at least 24 hours before the procedure.  Take a bath or shower the night before the procedure. You may need to scrub your chest or abdomen with a special type of soap.  Do not eat or drink before your procedure for as long as directed by your caregiver. Ask if it is okay to take any needed medicine with a small sip of water. PROCEDURE  The procedure to implant an ICD in your chest or abdomen is usually done at a hospital in a room that has a large X-ray machine called a fluoroscope. The machine will be above you during the procedure. It will help your caregiver see your heart during the procedure. Implanting an ICD usually takes 1-3 hours. Before the procedure:   Small monitors will be put on your body. They will be used to check your heart, blood pressure, and oxygen level.  A needle will be put into a vein in your hand or arm. This is called an intravenous (IV) access tube. Fluids and medicine will flow directly into your body through the IV tube.  Your chest or abdomen will be cleaned with a germ-killing (antiseptic) solution. The area may be shaved.  You may be given medicine to help you relax (sedative).  You will be given a medicine called a local anesthetic. This medicine will make the surgical site numb while the ICD is implanted. You will be sleepy but awake during the procedure. After you are numb the procedure will begin. The caregiver will:  Make a small cut (incision). This will make a pocket deep under your skin that will hold the pulse generator.  Guide the leads through a large blood  vessel into your heart and attach them to the heart muscles. Depending on the ICD, the leads may go into one ventricle or they may go to both ventricles and into an upper chamber of the heart (atrium).  Test the ICD.  Close the incision with stitches, glue, or staples. AFTER THE PROCEDURE  You may feel pain. Some pain is normal. It may last a few days.  You may stay in a recovery area until the local anesthetic has worn off. Your blood pressure and pulse will be checked often. You will be taken to a room where your heart will be monitored.  A chest X-ray will be taken. This is done to check that the cardioverter defibrillator is in the right place.  You may stay in the hospital overnight.  A slight bump may be seen over the skin where the ICD was placed. Sometimes, it is possible to feel the ICD under the skin. This is normal.  In the months and years afterward, your caregiver will check the device, the leads, and the battery every few months. Eventually, when the battery is low, the ICD will be replaced.   This information is not intended to replace advice given  to you by your health care provider. Make sure you discuss any questions you have with your health care provider.   Document Released: 02/20/2002 Document Revised: 03/21/2013 Document Reviewed: 06/19/2012 Elsevier Interactive Patient Education [employer].

## 2016-05-20 ENCOUNTER — Telehealth: Payer: Self-pay | Admitting: Cardiology

## 2016-05-20 NOTE — Telephone Encounter (Signed)
[name] is calling because he wanting to know will he need to bring his cpap machine or will the hospital provide him one . Has a procedure on 05/25/16 . Please call

## 2016-05-20 NOTE — Telephone Encounter (Signed)
Advised pt to take his personal CPAP machine with him to the hospital. Patient verbalized understanding and agreeable to plan.

## 2016-05-21 ENCOUNTER — Other Ambulatory Visit: Payer: 59 | Admitting: *Deleted

## 2016-05-21 DIAGNOSIS — Z01812 Encounter for preprocedural laboratory examination: Secondary | ICD-10-CM

## 2016-05-21 DIAGNOSIS — I255 Ischemic cardiomyopathy: Secondary | ICD-10-CM

## 2016-05-21 LAB — CBC WITH DIFFERENTIAL/PLATELET
Basophils Absolute: 0 cells/uL (ref 0–200)
Basophils Relative: 0 %
EOS ABS: 144 {cells}/uL (ref 15–500)
Eosinophils Relative: 2 %
HCT: 33.9 % — ABNORMAL LOW (ref 38.5–50.0)
Hemoglobin: 11 g/dL — ABNORMAL LOW (ref 13.2–17.1)
LYMPHS ABS: 3024 {cells}/uL (ref 850–3900)
Lymphocytes Relative: 42 %
MCH: 27 pg (ref 27.0–33.0)
MCHC: 32.4 g/dL (ref 32.0–36.0)
MCV: 83.1 fL (ref 80.0–100.0)
MONOS PCT: 7 %
Monocytes Absolute: 504 cells/uL (ref 200–950)
NEUTROS ABS: 3528 {cells}/uL (ref 1500–7800)
Neutrophils Relative %: 49 %
PLATELETS: 200 10*3/uL (ref 140–400)
RBC: 4.08 MIL/uL — AB (ref 4.20–5.80)
RDW: 19.1 % — ABNORMAL HIGH (ref 11.0–15.0)
WBC: 7.2 10*3/uL (ref 3.8–10.8)

## 2016-05-21 LAB — BASIC METABOLIC PANEL
BUN: 32 mg/dL — AB (ref 7–25)
CO2: 22 mmol/L (ref 20–31)
Calcium: 10.1 mg/dL (ref 8.6–10.3)
Chloride: 108 mmol/L (ref 98–110)
Creat: 3.21 mg/dL — ABNORMAL HIGH (ref 0.70–1.25)
Glucose, Bld: 153 mg/dL — ABNORMAL HIGH (ref 65–99)
POTASSIUM: 4.2 mmol/L (ref 3.5–5.3)
SODIUM: 140 mmol/L (ref 135–146)

## 2016-05-25 ENCOUNTER — Encounter (HOSPITAL_COMMUNITY): Payer: Self-pay | Admitting: General Practice

## 2016-05-25 ENCOUNTER — Ambulatory Visit (HOSPITAL_COMMUNITY)
Admission: RE | Admit: 2016-05-25 | Discharge: 2016-05-26 | Disposition: A | Discharge status: Home / Self Care | Payer: 59 | Source: Ambulatory Visit | Attending: Cardiology | Admitting: Cardiology

## 2016-05-25 ENCOUNTER — Encounter (HOSPITAL_COMMUNITY)
Admission: RE | Disposition: A | Discharge status: Home / Self Care | Payer: Self-pay | Source: Ambulatory Visit | Attending: Cardiology

## 2016-05-25 DIAGNOSIS — E785 Hyperlipidemia, unspecified: Secondary | ICD-10-CM | POA: Insufficient documentation

## 2016-05-25 DIAGNOSIS — Z794 Long term (current) use of insulin: Secondary | ICD-10-CM | POA: Diagnosis not present

## 2016-05-25 DIAGNOSIS — I5022 Chronic systolic (congestive) heart failure: Secondary | ICD-10-CM

## 2016-05-25 DIAGNOSIS — I509 Heart failure, unspecified: Secondary | ICD-10-CM | POA: Diagnosis not present

## 2016-05-25 DIAGNOSIS — I447 Left bundle-branch block, unspecified: Secondary | ICD-10-CM | POA: Insufficient documentation

## 2016-05-25 DIAGNOSIS — I429 Cardiomyopathy, unspecified: Secondary | ICD-10-CM | POA: Insufficient documentation

## 2016-05-25 DIAGNOSIS — E1122 Type 2 diabetes mellitus with diabetic chronic kidney disease: Secondary | ICD-10-CM | POA: Diagnosis not present

## 2016-05-25 DIAGNOSIS — N189 Chronic kidney disease, unspecified: Secondary | ICD-10-CM | POA: Insufficient documentation

## 2016-05-25 DIAGNOSIS — I130 Hypertensive heart and chronic kidney disease with heart failure and stage 1 through stage 4 chronic kidney disease, or unspecified chronic kidney disease: Secondary | ICD-10-CM | POA: Insufficient documentation

## 2016-05-25 DIAGNOSIS — G4733 Obstructive sleep apnea (adult) (pediatric): Secondary | ICD-10-CM | POA: Insufficient documentation

## 2016-05-25 DIAGNOSIS — Z006 Encounter for examination for normal comparison and control in clinical research program: Secondary | ICD-10-CM | POA: Insufficient documentation

## 2016-05-25 DIAGNOSIS — Z95818 Presence of other cardiac implants and grafts: Secondary | ICD-10-CM

## 2016-05-25 HISTORY — DX: Obstructive sleep apnea (adult) (pediatric): G47.33

## 2016-05-25 HISTORY — DX: Dependence on other enabling machines and devices: Z99.89

## 2016-05-25 HISTORY — DX: Type 2 diabetes mellitus without complications: E11.9

## 2016-05-25 HISTORY — PX: EP IMPLANTABLE DEVICE: SHX172B

## 2016-05-25 HISTORY — DX: Presence of cardiac pacemaker: Z95.0

## 2016-05-25 HISTORY — PX: INSERT / REPLACE / REMOVE PACEMAKER: SUR710

## 2016-05-25 HISTORY — DX: Anemia, unspecified: D64.9

## 2016-05-25 HISTORY — DX: Personal history of urinary calculi: Z87.442

## 2016-05-25 LAB — GLUCOSE, CAPILLARY
GLUCOSE-CAPILLARY: 106 mg/dL — AB (ref 65–99)
GLUCOSE-CAPILLARY: 247 mg/dL — AB (ref 65–99)
Glucose-Capillary: 58 mg/dL — ABNORMAL LOW (ref 65–99)
Glucose-Capillary: 72 mg/dL (ref 65–99)

## 2016-05-25 LAB — SURGICAL PCR SCREEN
MRSA, PCR: NEGATIVE
STAPHYLOCOCCUS AUREUS: NEGATIVE

## 2016-05-25 SURGERY — BIV ICD INSERTION CRT-D
Anesthesia: LOCAL

## 2016-05-25 MED ORDER — FENTANYL CITRATE (PF) 100 MCG/2ML IJ SOLN
INTRAMUSCULAR | Status: DC | PRN
  Administered 2016-05-25 (×3): 25 ug via INTRAVENOUS

## 2016-05-25 MED ORDER — ALLOPURINOL 300 MG PO TABS
300.0000 mg | ORAL_TABLET | Freq: Every day | ORAL | Status: DC
  Administered 2016-05-26: 09:00:00 300 mg via ORAL
  Filled 2016-05-25: qty 1

## 2016-05-25 MED ORDER — MIDAZOLAM HCL 5 MG/5ML IJ SOLN
INTRAMUSCULAR | Status: AC
  Filled 2016-05-25: qty 5

## 2016-05-25 MED ORDER — LINAGLIPTIN 5 MG PO TABS
5.0000 mg | ORAL_TABLET | Freq: Every day | ORAL | Status: DC
  Administered 2016-05-26: 5 mg via ORAL
  Filled 2016-05-25: qty 1

## 2016-05-25 MED ORDER — ROSUVASTATIN CALCIUM 20 MG PO TABS
20.0000 mg | ORAL_TABLET | Freq: Every day | ORAL | Status: DC
  Filled 2016-05-25: qty 1

## 2016-05-25 MED ORDER — HEPARIN (PORCINE) IN NACL 2-0.9 UNIT/ML-% IJ SOLN
INTRAMUSCULAR | Status: DC | PRN
  Administered 2016-05-25: 500 mL

## 2016-05-25 MED ORDER — CLONIDINE HCL 0.1 MG PO TABS
0.1000 mg | ORAL_TABLET | Freq: Every day | ORAL | Status: DC
  Administered 2016-05-26: 0.1 mg via ORAL
  Filled 2016-05-25: qty 1

## 2016-05-25 MED ORDER — LIDOCAINE HCL (PF) 1 % IJ SOLN
INTRAMUSCULAR | Status: AC
  Filled 2016-05-25: qty 60

## 2016-05-25 MED ORDER — INSULIN GLARGINE 100 UNIT/ML ~~LOC~~ SOLN
40.0000 [IU] | Freq: Every day | SUBCUTANEOUS | Status: DC
  Filled 2016-05-25: qty 0.4

## 2016-05-25 MED ORDER — GLIMEPIRIDE 4 MG PO TABS
4.0000 mg | ORAL_TABLET | Freq: Two times a day (BID) | ORAL | Status: DC
  Administered 2016-05-25 – 2016-05-26 (×2): 4 mg via ORAL
  Filled 2016-05-25 (×3): qty 1

## 2016-05-25 MED ORDER — INSULIN GLARGINE 100 UNIT/ML ~~LOC~~ SOLN
20.0000 [IU] | Freq: Every day | SUBCUTANEOUS | Status: DC
  Administered 2016-05-25: 23:00:00 20 [IU] via SUBCUTANEOUS
  Filled 2016-05-25 (×2): qty 0.2

## 2016-05-25 MED ORDER — YOU HAVE A PACEMAKER BOOK
Freq: Once | Status: AC
  Administered 2016-05-25: 1
  Filled 2016-05-25: qty 1

## 2016-05-25 MED ORDER — IOPAMIDOL (ISOVUE-370) INJECTION 76%
INTRAVENOUS | Status: DC | PRN
  Administered 2016-05-25: 12 mL via INTRAVENOUS

## 2016-05-25 MED ORDER — MUPIROCIN 2 % EX OINT
1.0000 "application " | TOPICAL_OINTMENT | Freq: Once | CUTANEOUS | Status: DC
  Filled 2016-05-25: qty 22

## 2016-05-25 MED ORDER — ONDANSETRON HCL 4 MG/2ML IJ SOLN: 4.0000 mg | Freq: Four times a day (QID) | INTRAMUSCULAR | Status: DC | PRN

## 2016-05-25 MED ORDER — MUPIROCIN 2 % EX OINT
TOPICAL_OINTMENT | CUTANEOUS | Status: AC
  Administered 2016-05-25: 1
  Filled 2016-05-25: qty 22

## 2016-05-25 MED ORDER — INSULIN ASPART 100 UNIT/ML ~~LOC~~ SOLN
4.0000 [IU] | Freq: Once | SUBCUTANEOUS | Status: AC
  Administered 2016-05-25: 4 [IU] via SUBCUTANEOUS

## 2016-05-25 MED ORDER — HEPARIN (PORCINE) IN NACL 2-0.9 UNIT/ML-% IJ SOLN
INTRAMUSCULAR | Status: AC
  Filled 2016-05-25: qty 500

## 2016-05-25 MED ORDER — ACETAMINOPHEN 325 MG PO TABS
325.0000 mg | ORAL_TABLET | ORAL | Status: DC | PRN
  Administered 2016-05-25: 650 mg via ORAL
  Administered 2016-05-26 (×3): 325 mg via ORAL
  Filled 2016-05-25: qty 2
  Filled 2016-05-25: qty 1
  Filled 2016-05-25 (×2): qty 2

## 2016-05-25 MED ORDER — LIDOCAINE HCL (PF) 1 % IJ SOLN
INTRAMUSCULAR | Status: DC | PRN
  Administered 2016-05-25: 45 mL via SUBCUTANEOUS

## 2016-05-25 MED ORDER — ISOSORB DINITRATE-HYDRALAZINE 20-37.5 MG PO TABS
1.0000 | ORAL_TABLET | Freq: Two times a day (BID) | ORAL | Status: DC
  Administered 2016-05-25 – 2016-05-26 (×2): 1 via ORAL
  Filled 2016-05-25 (×2): qty 1

## 2016-05-25 MED ORDER — FUROSEMIDE 40 MG PO TABS
40.0000 mg | ORAL_TABLET | Freq: Two times a day (BID) | ORAL | Status: DC
  Administered 2016-05-25 – 2016-05-26 (×2): 40 mg via ORAL
  Filled 2016-05-25 (×3): qty 1

## 2016-05-25 MED ORDER — MIDAZOLAM HCL 5 MG/5ML IJ SOLN
INTRAMUSCULAR | Status: DC | PRN
  Administered 2016-05-25 (×5): 1 mg via INTRAVENOUS

## 2016-05-25 MED ORDER — CLONIDINE HCL 0.2 MG PO TABS
0.2000 mg | ORAL_TABLET | Freq: Every day | ORAL | Status: DC
  Administered 2016-05-25: 0.2 mg via ORAL
  Filled 2016-05-25: qty 1
  Filled 2016-05-25: qty 2

## 2016-05-25 MED ORDER — CARVEDILOL 25 MG PO TABS
25.0000 mg | ORAL_TABLET | Freq: Two times a day (BID) | ORAL | Status: DC
  Administered 2016-05-25 – 2016-05-26 (×2): 25 mg via ORAL
  Filled 2016-05-25 (×2): qty 2
  Filled 2016-05-25 (×2): qty 1

## 2016-05-25 MED ORDER — FENTANYL CITRATE (PF) 100 MCG/2ML IJ SOLN
INTRAMUSCULAR | Status: AC
  Filled 2016-05-25: qty 2

## 2016-05-25 MED ORDER — BASAGLAR KWIKPEN 100 UNIT/ML ~~LOC~~ SOPN: 40.0000 [IU] | PEN_INJECTOR | Freq: Every day | SUBCUTANEOUS | Status: DC

## 2016-05-25 MED ORDER — INSULIN ASPART 100 UNIT/ML ~~LOC~~ SOLN: 0.0000 [IU] | Freq: Three times a day (TID) | SUBCUTANEOUS | Status: DC

## 2016-05-25 MED ORDER — IOPAMIDOL (ISOVUE-370) INJECTION 76%
INTRAVENOUS | Status: AC
  Filled 2016-05-25: qty 50

## 2016-05-25 MED ORDER — POTASSIUM CHLORIDE CRYS ER 10 MEQ PO TBCR
10.0000 meq | EXTENDED_RELEASE_TABLET | Freq: Two times a day (BID) | ORAL | Status: DC
  Administered 2016-05-25 – 2016-05-26 (×2): 10 meq via ORAL
  Filled 2016-05-25 (×2): qty 1

## 2016-05-25 MED ORDER — DEXTROSE 5 % IV SOLN
3.0000 g | INTRAVENOUS | Status: AC
  Administered 2016-05-25: 3 g via INTRAVENOUS
  Filled 2016-05-25: qty 3000

## 2016-05-25 MED ORDER — SODIUM CHLORIDE 0.9 % IR SOLN
80.0000 mg | Status: AC
  Administered 2016-05-25: 80 mg

## 2016-05-25 MED ORDER — INSULIN ASPART 100 UNIT/ML ~~LOC~~ SOLN: 0.0000 [IU] | SUBCUTANEOUS | Status: DC

## 2016-05-25 MED ORDER — CEFAZOLIN SODIUM-DEXTROSE 2-4 GM/100ML-% IV SOLN
2.0000 g | Freq: Four times a day (QID) | INTRAVENOUS | Status: AC
  Administered 2016-05-25 – 2016-05-26 (×3): 2 g via INTRAVENOUS
  Filled 2016-05-25 (×3): qty 100

## 2016-05-25 MED ORDER — BUPIVACAINE-EPINEPHRINE (PF) 0.25% -1:200000 IJ SOLN
INTRAMUSCULAR | Status: AC
  Filled 2016-05-25: qty 90

## 2016-05-25 MED ORDER — SODIUM CHLORIDE 0.9 % IV SOLN
INTRAVENOUS | Status: DC
  Administered 2016-05-25: 10:00:00 via INTRAVENOUS

## 2016-05-25 MED ORDER — SODIUM CHLORIDE 0.9 % IR SOLN
Status: AC
  Filled 2016-05-25: qty 2

## 2016-05-25 SURGICAL SUPPLY — 18 items
BALLN ATTAIN 80 (BALLOONS) ×2
BALLOON ATTAIN 80 (BALLOONS) ×1 IMPLANT
CABLE SURGICAL S-101-97-12 (CABLE) ×2 IMPLANT
CATH ATTAIN COM SURV 6250V-MB2 (CATHETERS) ×2 IMPLANT
CATH ATTAIN COMMAND 6250-MB2 (CATHETERS) IMPLANT
ICD CLARIA MRI DTMA1QQ (ICD Generator) ×2 IMPLANT
KIT ESSENTIALS PG (KITS) ×2 IMPLANT
LEAD ATTAIN PERFOMA 4298-88CM (Lead) ×2 IMPLANT
LEAD CAPSURE NOVUS 5076-52CM (Lead) ×2 IMPLANT
LEAD SPRINT QUAT SEC 6935M-62 (Lead) ×2 IMPLANT
PAD DEFIB LIFELINK (PAD) ×2 IMPLANT
SHEATH CLASSIC 7F (SHEATH) ×2 IMPLANT
SHEATH CLASSIC 9.5F (SHEATH) ×2 IMPLANT
SHEATH CLASSIC 9F (SHEATH) ×2 IMPLANT
SLITTER 6230UNI (MISCELLANEOUS) ×2 IMPLANT
TRAY PACEMAKER INSERTION (PACKS) ×2 IMPLANT
WIRE ACUITY WHISPER EDS 4648 (WIRE) ×2 IMPLANT
WIRE HI TORQ VERSACORE-J 145CM (WIRE) ×2 IMPLANT

## 2016-05-25 NOTE — H&P (Signed)
Matthew Wood has a history of a nonischemic cardiomyopathy with a LBBB. He presents today for CRT-D placement.  On exam, regular rhythm, no murmurs, lungs clear. Risks and benefits explained.  Risks include but not limited to bleeding, infection, tamponade, and pneumothorax.  The patient understands the risks and has agreed to the procedure.  Britnay Magnussen Curt Bears, MD 05/25/2016 10:30 AM  ICD Criteria  Current LVEF:30-35%. Within 12 months prior to implant: Yes   Heart failure history: Yes, Class II  Cardiomyopathy history: Yes, Non-Ischemic Cardiomyopathy.  Atrial Fibrillation/Atrial Flutter: No.  Ventricular tachycardia history: No.  Cardiac arrest history: No.  History of syndromes with risk of sudden death: No.  Previous ICD: No.  Current ICD indication: Primary  PPM indication: No.   Class I or II Bradycardia indication present: No  Beta Blocker therapy for 3 or more months: Yes, prescribed.   Ace Inhibitor/ARB therapy for 3 or more months: Yes, prescribed.

## 2016-05-26 ENCOUNTER — Ambulatory Visit (HOSPITAL_COMMUNITY): Payer: 59

## 2016-05-26 ENCOUNTER — Encounter (HOSPITAL_COMMUNITY): Payer: Self-pay | Admitting: Cardiology

## 2016-05-26 DIAGNOSIS — I255 Ischemic cardiomyopathy: Secondary | ICD-10-CM

## 2016-05-26 DIAGNOSIS — I429 Cardiomyopathy, unspecified: Secondary | ICD-10-CM | POA: Diagnosis not present

## 2016-05-26 DIAGNOSIS — Z006 Encounter for examination for normal comparison and control in clinical research program: Secondary | ICD-10-CM | POA: Diagnosis not present

## 2016-05-26 DIAGNOSIS — I447 Left bundle-branch block, unspecified: Secondary | ICD-10-CM | POA: Diagnosis not present

## 2016-05-26 DIAGNOSIS — I509 Heart failure, unspecified: Secondary | ICD-10-CM | POA: Diagnosis not present

## 2016-05-26 LAB — GLUCOSE, CAPILLARY: Glucose-Capillary: 119 mg/dL — ABNORMAL HIGH (ref 65–99)

## 2016-05-26 NOTE — Care Management Note (Signed)
Case Management Note  Patient Details  Name: Matthew Wood MRN: [id number] Date of Birth: 02-19-1951  Subjective/Objective:     S/p pace maker implant, for dc today, No needs.               Action/Plan:   Expected Discharge Date:                  Expected Discharge Plan:  Home/Self Care  In-House Referral:     Discharge planning Services  CM Consult  Post Acute Care Choice:    Choice offered to:     DME Arranged:    DME Agency:     HH Arranged:    HH Agency:     Status of Service:  Completed, signed off  If discussed at [employer] of Stay Meetings, dates discussed:    Additional Comments:  Zenon Mayo, RN 05/26/2016, 9:01 AM

## 2016-05-26 NOTE — Discharge Summary (Signed)
ELECTROPHYSIOLOGY PROCEDURE DISCHARGE SUMMARY    Patient ID: Matthew Wood,  MRN: [id number], DOB/AGE: 63-May-1952 63 y.o.  Admit date: 05/25/2016 Discharge date: 05/26/2016  Primary Care Physician: Rogers Blocker, MD Primary Cardiologist: Tamala Julian Electrophysiologist: Curt Bears  Primary Discharge Diagnosis:  ICM, congestive heart failure, LBBB s/p CRTD implant this admission  Secondary Discharge Diagnosis:  1.  Hypertension 2.  Hyperlipidemia 3.  OSA on CPAP 4.  CKD 5.  Diabetes   No Known Allergies   Procedures This Admission:  1.  Implantation of a MDT CRTD on 05/25/16 by Dr Curt Bears.  See op note for full details. There were no immediate post procedure complications. 2.  CXR on 05/26/16 demonstrated no pneumothorax status post device implantation.   Brief HPI: Matthew Wood is a 65 y.o. male was referred to electrophysiology in the outpatient setting for consideration of ICD implantation.  Past medical history includes ICM, congestive heart failure, LBBB.  The patient has persistent LV dysfunction despite guideline directed therapy.  Risks, benefits, and alternatives to ICD implantation were reviewed with the patient who wished to proceed.   Hospital Course:  The patient was admitted and underwent implantation of a MDT CRTD with details as outlined above. He was monitored on telemetry overnight which demonstrated AV pacing.  Left chest was without hematoma or ecchymosis.  The device was interrogated and found to be functioning normally.  CXR was obtained and demonstrated no pneumothorax status post device implantation.  Wound care, arm mobility, and restrictions were reviewed with the patient.  The patient was examined and considered stable for discharge to home.   The patient's discharge medications include an ACE-I (Benazepril) and beta blocker (Coreg).   Physical Exam: Vitals:   05/26/16 0525 05/26/16 0634 05/26/16 0701 05/26/16 0756  BP: 133/67   (!) 142/78  Pulse:     61  Resp:   15 15  Temp: 98.1 F (36.7 C)   98 F (36.7 C)  TempSrc: Oral   Oral  SpO2:    97%  Weight:  298 lb 8.1 oz (135.4 kg)    Height:        GEN- The patient is well appearing, alert and oriented x 3 today.   HEENT: normocephalic, atraumatic; sclera clear, conjunctiva pink; hearing intact; oropharynx clear; neck supple  Lungs- Clear to ausculation bilaterally, normal work of breathing.  No wheezes, rales, rhonchi Heart- Regular rate and rhythm  GI- soft, non-tender, non-distended, bowel sounds present  Extremities- no clubbing, cyanosis, or edema  MS- no significant deformity or atrophy Skin- warm and dry, no rash or lesion, left chest without hematoma/ecchymosis Psych- euthymic mood, full affect Neuro- strength and sensation are intact   Labs:   Lab Results  Component Value Date   WBC 7.2 05/21/2016   HGB 11.0 (L) 05/21/2016   HCT 33.9 (L) 05/21/2016   MCV 83.1 05/21/2016   PLT 200 05/21/2016     Recent Labs Lab 05/21/16 0904  NA 140  K 4.2  CL 108  CO2 22  BUN 32*  CREATININE 3.21*  CALCIUM 10.1  GLUCOSE 153*    Discharge Medications:    Medication List    TAKE these medications   allopurinol 300 MG tablet Commonly known as:  ZYLOPRIM Take 300 mg by mouth daily.   amLODipine-benazepril 10-40 MG capsule Commonly known as:  LOTREL Take 1 capsule by mouth every morning.   BASAGLAR KWIKPEN 100 UNIT/ML Sopn Inject 40 Units into the skin at bedtime.  carvedilol 25 MG tablet Commonly known as:  COREG Take 25 mg by mouth 2 (two) times daily with a meal.   cloNIDine 0.2 MG tablet Commonly known as:  CATAPRES Take 0.2-1 mg by mouth 2 (two) times daily. 0.1 MG IN THE MORNING AND 0.2 MG AT BEDTIME.   furosemide 20 MG tablet Commonly known as:  LASIX Take 40 mg by mouth 2 (two) times daily.   glimepiride 4 MG tablet Commonly known as:  AMARYL Take 4 mg by mouth 2 (two) times daily.   insulin lispro 100 UNIT/ML injection Commonly known as:   HUMALOG Inject 20 Units into the skin 3 (three) times daily before meals.   isosorbide-hydrALAZINE 20-37.5 MG tablet Commonly known as:  BIDIL Take 1 tablet by mouth 2 (two) times daily.   NON FORMULARY cpap as directed   ONGLYZA 5 MG Tabs tablet Generic drug:  saxagliptin HCl Take 5 mg by mouth daily.   potassium chloride 10 MEQ tablet Commonly known as:  K-DUR,KLOR-CON Take 10 mEq by mouth 2 (two) times daily.   RAYALDEE PO Take 1 tablet by mouth daily.   rosuvastatin 20 MG tablet Commonly known as:  CRESTOR Take 20 mg by mouth daily.       Disposition:  Discharge Instructions    Diet - low sodium heart healthy    Complete by:  As directed    Increase activity slowly    Complete by:  As directed      Follow-up Information    [facility] Follow up on 06/03/2016.   Specialty:  Cardiology Why:  at 9:30AM for wound check  Contact information: [address], [place] [phone]       Johnwesley Lederman Meredith Leeds, MD Follow up on 09/08/2016.   Specialty:  Cardiology Why:  at 1:30PM  Contact information: [place] [address] [phone]           Duration of Discharge Encounter: Greater than 30 minutes including physician time.  Signed, Chanetta Marshall, NP 05/26/2016 8:47 AM  I have seen and examined this patient with Chanetta Marshall.  Agree with above, note added to reflect my findings.  On exam, regular rhythm, no murmurs, lungs clear.  CRT-D placed yesterday.  No abnormalities on CXR or interrogation. Plan for discharge with follow up in device clinic in 10 days.Allegra Lai, MD 05/27/2016 7:08 AM

## 2016-05-26 NOTE — Discharge Instructions (Signed)
° ° °  Supplemental Discharge Instructions for  Pacemaker/Defibrillator Patients  Activity No heavy lifting or vigorous activity with your left/right arm for 6 to 8 weeks.  Do not raise your left/right arm above your head for one week.  Gradually raise your affected arm as drawn below.           __        05/29/16                  05/30/16                 05/31/16                 06/01/16  NO DRIVING for   1 week  ; you may begin driving on   60/73/71  .  WOUND CARE - Keep the wound area clean and dry.  Do not get this area wet for one week. No showers for one week; you may shower on  06/01/16   . - The tape/steri-strips on your wound will fall off; do not pull them off.  No bandage is needed on the site.  DO  NOT apply any creams, oils, or ointments to the wound area. - If you notice any drainage or discharge from the wound, any swelling or bruising at the site, or you develop a fever > 101? F after you are discharged home, call the office at once.  Special Instructions - You are still able to use cellular telephones; use the ear opposite the side where you have your pacemaker/defibrillator.  Avoid carrying your cellular phone near your device. - When traveling through airports, show security personnel your identification card to avoid being screened in the metal detectors.  Ask the security personnel to use the hand wand. - Avoid arc welding equipment, MRI testing (magnetic resonance imaging), TENS units (transcutaneous nerve stimulators).  Call the office for questions about other devices. - Avoid electrical appliances that are in poor condition or are not properly grounded. - Microwave ovens are safe to be near or to operate.  Additional information for defibrillator patients should your device go off: - If your device goes off ONCE and you feel fine afterward, notify the device clinic nurses. - If your device goes off ONCE and you do not feel well afterward, call 911. - If your device  goes off TWICE, call 911. - If your device goes off THREE times in one day, call 911.  DO NOT DRIVE YOURSELF OR A FAMILY MEMBER WITH A DEFIBRILLATOR TO THE HOSPITAL--CALL 911.

## 2016-05-31 ENCOUNTER — Telehealth: Payer: Self-pay | Admitting: Pulmonary Disease

## 2016-05-31 NOTE — Telephone Encounter (Signed)
Spoke with the pt's spouse  She states that [place] could not read our order for CPAP machine  I am not showing that we even sent one period  Called [facility] and NA- WCB in the am

## 2016-06-01 ENCOUNTER — Other Ambulatory Visit: Payer: Self-pay

## 2016-06-01 ENCOUNTER — Telehealth: Payer: Self-pay

## 2016-06-01 DIAGNOSIS — G4730 Sleep apnea, unspecified: Secondary | ICD-10-CM

## 2016-06-01 NOTE — Telephone Encounter (Signed)
Pt. Wife called the office because no contacted her husband about if he needed another cpap, I apologized to her and stated he looked like Dr. Elsworth Soho was waiting on a download and his sleep study but I do not see any where documented where we have received it.  Did reach out to Dr. Randel Pigg office and they stated they will fax it over. Will forward to the nurse to look out for.   Instructions   We will try to obtain a copy of your sleep study, current settings and download report from [place] [facility]  Based on this you may need a repeat titration study Then we will set you up with a new CPAP machine  Call us with problems

## 2016-06-01 NOTE — Telephone Encounter (Signed)
Pt's wife called to follow up on order for CPAP machine. Patient wife ask for someone to call her back today.

## 2016-06-01 NOTE — Telephone Encounter (Signed)
Called [facility] and they did not see an order from [place]. I did ask them if they had a copy of the pt.'s sleep study because acorin to RA instructions he wanted to obtain this but they stated they did not. He also wanted a download. I went ahead and placed the order. I called and spoke with the wife and apologize for the delay and informed her that we are waiting on a download from his machine, and to obtain his sleep study. She stated she will take his machine to [facility] so we can get a download an call us once they have faxed it. We will await her phone call nothing further is needed at this time.   Instructions   We will try to obtain a copy of your sleep study, current settings and download report from [place] [facility]  Based on this you may need a repeat titration study Then we will set you up with a new CPAP machine  Call us with problems

## 2016-06-03 ENCOUNTER — Ambulatory Visit (INDEPENDENT_AMBULATORY_CARE_PROVIDER_SITE_OTHER): Payer: 59 | Admitting: *Deleted

## 2016-06-03 ENCOUNTER — Telehealth: Payer: Self-pay | Admitting: Pulmonary Disease

## 2016-06-03 DIAGNOSIS — I255 Ischemic cardiomyopathy: Secondary | ICD-10-CM

## 2016-06-03 LAB — CUP PACEART INCLINIC DEVICE CHECK
Battery Remaining Longevity: 80 mo
Brady Statistic AS VS Percent: 0.59 %
Date Time Interrogation Session: 20171221102803
HighPow Impedance: 49 Ohm
Implantable Lead Implant Date: 20171212
Implantable Lead Implant Date: 20171212
Implantable Lead Location: 753858
Implantable Lead Location: 753859
Implantable Lead Location: 753860
Implantable Lead Model: 4298
Implantable Lead Model: 5076
Implantable Pulse Generator Implant Date: 20171212
Lead Channel Impedance Value: 132.321
Lead Channel Impedance Value: 136.276
Lead Channel Impedance Value: 136.276
Lead Channel Impedance Value: 304 Ohm
Lead Channel Impedance Value: 399 Ohm
Lead Channel Impedance Value: 418 Ohm
Lead Channel Impedance Value: 418 Ohm
Lead Channel Impedance Value: 456 Ohm
Lead Channel Impedance Value: 456 Ohm
Lead Channel Impedance Value: 456 Ohm
Lead Channel Impedance Value: 513 Ohm
Lead Channel Pacing Threshold Amplitude: 0.5 V
Lead Channel Pacing Threshold Amplitude: 0.75 V
Lead Channel Pacing Threshold Pulse Width: 0.4 ms
Lead Channel Pacing Threshold Pulse Width: 0.4 ms
Lead Channel Setting Pacing Amplitude: 3.5 V
Lead Channel Setting Sensing Sensitivity: 0.3 mV
MDC IDC LEAD IMPLANT DT: 20171212
MDC IDC MSMT BATTERY VOLTAGE: 3.12 V
MDC IDC MSMT LEADCHNL LV IMPEDANCE VALUE: 147.097
MDC IDC MSMT LEADCHNL LV IMPEDANCE VALUE: 152 Ohm
MDC IDC MSMT LEADCHNL LV IMPEDANCE VALUE: 247 Ohm
MDC IDC MSMT LEADCHNL LV IMPEDANCE VALUE: 285 Ohm
MDC IDC MSMT LEADCHNL LV IMPEDANCE VALUE: 304 Ohm
MDC IDC MSMT LEADCHNL LV IMPEDANCE VALUE: 513 Ohm
MDC IDC MSMT LEADCHNL RA SENSING INTR AMPL: 3.5 mV
MDC IDC MSMT LEADCHNL RV IMPEDANCE VALUE: 304 Ohm
MDC IDC MSMT LEADCHNL RV PACING THRESHOLD AMPLITUDE: 1 V
MDC IDC MSMT LEADCHNL RV PACING THRESHOLD PULSEWIDTH: 0.4 ms
MDC IDC MSMT LEADCHNL RV SENSING INTR AMPL: 19.375 mV
MDC IDC SET LEADCHNL LV PACING AMPLITUDE: 3 V
MDC IDC SET LEADCHNL LV PACING PULSEWIDTH: 0.4 ms
MDC IDC SET LEADCHNL RV PACING AMPLITUDE: 3.5 V
MDC IDC SET LEADCHNL RV PACING PULSEWIDTH: 0.4 ms
MDC IDC STAT BRADY AP VP PERCENT: 63.53 %
MDC IDC STAT BRADY AP VS PERCENT: 0.08 %
MDC IDC STAT BRADY AS VP PERCENT: 35.79 %
MDC IDC STAT BRADY RA PERCENT PACED: 63 %
MDC IDC STAT BRADY RV PERCENT PACED: 97.1 %

## 2016-06-03 NOTE — Telephone Encounter (Signed)
lmtcb for pt's wife 

## 2016-06-03 NOTE — Progress Notes (Signed)
CRT-D device check in office. Thresholds and sensing consistent with previous device measurements. Lead impedance trends stable over time. No mode switch episodes recorded. No ventricular arrhythmia episodes recorded. Patient bi-ventricularly pacing 97.7% of the time. Device programmed with appropriate safety margins. Heart failure diagnostics reviewed and trends are stable for patient. Audible/vibratory alerts demonstrated for patient. No changes made this session. Estimated longevity 6.44yrs.  Patient enrolled in remote follow up. ROV with WC 3/28. Patient education completed including shock plan.

## 2016-06-03 NOTE — Patient Instructions (Signed)
No lifting greater than 10lbs until January 23rd.  May wash incision with soap and water. No dressings, creams, lotions or ointments.

## 2016-06-04 NOTE — Telephone Encounter (Signed)
Spoke with pt. Wife she was checking to see if we received the download from [place], so RA can look at it and decide if he can have a new machine. I did not see it in RA look at will forward to the nurse. She states she needs the order placed before the end of the year for billings purposes.

## 2016-06-04 NOTE — Telephone Encounter (Signed)
[facility] called requesting the sleep study to be fax [phone]) contact [place] [phone]).Matthew Wood

## 2016-06-04 NOTE — Telephone Encounter (Signed)
I have looked in the pt's chart. We do not have a copy of his sleep study. I spoke with the pt's wife and she said to contact pt's PCP, they should have copy of this. I called Dr. Randel Pigg office and was told that they couldn't find it. Called the pt's wife back and her and the pt both became very upset and mad. Pt states that he had his sleep study over at [facility]. I have left a message with the sleep center, will await their call back.

## 2016-06-04 NOTE — Telephone Encounter (Signed)
Patient's wife request for [facility] to give her a call.

## 2016-06-09 NOTE — Telephone Encounter (Signed)
Pt wife calling wanting to know if pt  Has an order pending for his cpap, please advise her # [phone].Hillery Hunter

## 2016-06-09 NOTE — Telephone Encounter (Signed)
I have attempted to contact [facility] to find out about the download, but was placed on hold for over 10 mins.    Ashtyn have you seen this download come in for RA?  Pt is wanting to get the new cpap ordered before the end of the year so his insurance will cover this for this year.  Please advise. thanks

## 2016-06-10 NOTE — Telephone Encounter (Addendum)
This in his look at folder to be reviewed.  RA is not back in the office until after the new year to review this.  Only option is to have another sleep physician look at this and ask if they will place the order otherwise the patient will have to wait until RA is back in office.

## 2016-06-10 NOTE — Telephone Encounter (Signed)
lmomtcb x 1 for the pts wife to make her aware of AG recs as stated.

## 2016-06-11 NOTE — Telephone Encounter (Signed)
Pt wife returning call.Stanley A Dalton' °

## 2016-06-11 NOTE — Telephone Encounter (Signed)
   DL for the last month (until 06/03/16) : 100%, AHI 3.9. On cpap 16 cm water.  I will continue with cpap 16 cm water. No need for a new cpap setting or machine unless the pt is NOT able to tolerate the pressure of 16 cm water. pls let me know then. Thanks.   Monica Becton, MD 06/11/2016, 1:59 PM [facility] Pager [phone] After 3 pm or if no answer, call [phone]

## 2016-06-11 NOTE — Telephone Encounter (Signed)
Called and spoke with pts wife and she is aware of AD recs.  I advised her that the only other option to get a new machine is to wait for RA to come back in the office.  pts wife stated that she will call and speak with [facility] and have them contact us for anything further.

## 2016-06-11 NOTE — Telephone Encounter (Signed)
I spoke with the pt's spouse and notified of the situation  She is requesting that we ask another provider to review the DL and order CPAP  I advised we can try asking AD to see if he would be willing to  I gave a copy of the DL to [facility]  Please advise if you are willing to help with this, so that insurance will cover the machine since the year is ending, thanks

## 2016-06-15 ENCOUNTER — Telehealth: Payer: Self-pay | Admitting: Pulmonary Disease

## 2016-06-15 DIAGNOSIS — G4733 Obstructive sleep apnea (adult) (pediatric): Secondary | ICD-10-CM

## 2016-06-15 NOTE — Telephone Encounter (Signed)
Download reviewed Okay to send prescription to DME for new CPAP 16 cm

## 2016-06-16 NOTE — Telephone Encounter (Signed)
Spoke with pt's wife. She is aware of RA's original message. Order has been placed. Nothing further was needed.

## 2016-06-16 NOTE — Telephone Encounter (Signed)
Charmaine-wife calling back [phone]

## 2016-06-29 ENCOUNTER — Telehealth: Payer: Self-pay | Admitting: Pulmonary Disease

## 2016-06-29 NOTE — Telephone Encounter (Signed)
Spoke with pt's wife, who is upset that pt's sleep study has not been faxed to [place].  According to pt's wife, pt had sleep study done in December at [facility]  I advised that I could not see where pt had sleep study done.  Per several previous phone notes, sleep study was being obtained through Dr. Randel Pigg office.  Pt's wife became upset when I mentioned this doctor, stating that he would not have this sleep study and that I need to call to [facility] to get it.  I advised that we share a chart with [facility] and that I do not see any sleep study.  Jasmine, it looks like you've been working on getting this sleep study for pt.  Do you have a copy of this sleep study?  Thanks.

## 2016-07-02 NOTE — Telephone Encounter (Signed)
Lm to have crystal return our call.

## 2016-07-02 NOTE — Telephone Encounter (Signed)
I have spoke with Gilmer Mor with [employer], who states he will put in request to have download sent to [place]. I have spoke with pt's wife, who states Dr. Marlou Sa ordered this sleep study >5 years ago. I have attempted to call Dr. Randel Pigg office. Dr. Randel Pigg office is currently closed due to the weather. Will hold in triage to try again on Monday. I have made pt's wife aware that Dr. Randel Pigg office is currently close.

## 2016-07-02 NOTE — Telephone Encounter (Signed)
Matthew Wood called back from Dr. Randel Pigg office she nor Dr. Marlou Sa was not able to find sleep study in their records. She can be reached at [phone] -pr

## 2016-07-05 NOTE — Telephone Encounter (Signed)
Called and spoke to [place] [place] [facility]. They do not have a sleep study on file for pt.  LMTCB for Crystal with Dr. Randel Pigg office.

## 2016-07-06 NOTE — Telephone Encounter (Signed)
lmtcb for Crystal at Dr. Randel Pigg office.

## 2016-07-08 NOTE — Telephone Encounter (Signed)
Upon inspection of pt's chart, sleep study was done 2003 Called [facility] but they are unable to go that far back if the study was not scanned into epic Paper chart requested since Dr. Randel Pigg office reportedly does not have a copy  [facility] at the number provided in the message, was on hold x5 mins Called spoke with patient's spouse Matthew Wood and discussed the above - she is okay with this and voiced her understanding and reported that it is her understanding that as long as [employer] received the order from this office in 2017, [facility] will cover a loaner machine.  She is aware it will be next week before we receive the paper chart.  ** Triage: make sure the sleep study is sent for scan! **

## 2016-07-09 ENCOUNTER — Telehealth: Payer: Self-pay | Admitting: Interventional Cardiology

## 2016-07-09 DIAGNOSIS — I5022 Chronic systolic (congestive) heart failure: Secondary | ICD-10-CM

## 2016-07-09 MED ORDER — FUROSEMIDE 80 MG PO TABS: 80.0000 mg | ORAL_TABLET | Freq: Two times a day (BID) | ORAL | 3 refills | Status: DC

## 2016-07-09 NOTE — Telephone Encounter (Signed)
Spoke with wife and informed her of recommendations per Dr. Tamala Julian.  Wife agreeable to plan.  Pt will come Monday for labs and Tuesday at 11A for appt.  Wife appreciative for assistance.

## 2016-07-09 NOTE — Telephone Encounter (Signed)
Left message to call back  

## 2016-07-09 NOTE — Telephone Encounter (Signed)
New Message  Pt voiced wanting to speak with [facility] nurse.  Please f/u

## 2016-07-09 NOTE — Telephone Encounter (Signed)
Spoke with wife, DPR on file.  Pt was seen by nephrologist, Dr. Littie Deeds, yesterday and he advised pt to call our office and get in to see Dr. Tamala Julian.  Pt recently diagnosed with CHF and weight is currently up to 316lbs. Dr. Marval Regal advised pt at appt yesterday to increase :Lasix to 60mg  in AM and 40mg  in PM.  Was also told to limit fluids to 1 qt per day.  Wife denies significant SOB, just has slight SOB when climbing stairs.  Scheduled pt to see Dr. Tamala Julian 07/23/16.  Advised wife I would send message to Dr. Tamala Julian for review and advisement.  Wife appreciative for assistance.

## 2016-07-09 NOTE — Telephone Encounter (Signed)
Increase furosemide to 80 mg twice a day. Basic metabolic panel on Monday. Office visit with me on Tuesday.

## 2016-07-12 ENCOUNTER — Other Ambulatory Visit: Payer: 59 | Admitting: *Deleted

## 2016-07-12 DIAGNOSIS — I10 Essential (primary) hypertension: Secondary | ICD-10-CM

## 2016-07-12 DIAGNOSIS — E7800 Pure hypercholesterolemia, unspecified: Secondary | ICD-10-CM

## 2016-07-12 NOTE — Addendum Note (Signed)
Addended by: Eulis Foster on: 07/12/2016 03:45 PM   Modules accepted: Orders

## 2016-07-13 ENCOUNTER — Encounter: Payer: Self-pay | Admitting: Interventional Cardiology

## 2016-07-13 ENCOUNTER — Ambulatory Visit (INDEPENDENT_AMBULATORY_CARE_PROVIDER_SITE_OTHER): Payer: 59 | Admitting: Interventional Cardiology

## 2016-07-13 VITALS — BP 140/86 | HR 86 | Ht 72.0 in | Wt 315.0 lb

## 2016-07-13 DIAGNOSIS — E784 Other hyperlipidemia: Secondary | ICD-10-CM | POA: Diagnosis not present

## 2016-07-13 DIAGNOSIS — I5022 Chronic systolic (congestive) heart failure: Secondary | ICD-10-CM

## 2016-07-13 DIAGNOSIS — I2510 Atherosclerotic heart disease of native coronary artery without angina pectoris: Secondary | ICD-10-CM

## 2016-07-13 DIAGNOSIS — G4733 Obstructive sleep apnea (adult) (pediatric): Secondary | ICD-10-CM

## 2016-07-13 DIAGNOSIS — Z95810 Presence of automatic (implantable) cardiac defibrillator: Secondary | ICD-10-CM

## 2016-07-13 DIAGNOSIS — I10 Essential (primary) hypertension: Secondary | ICD-10-CM

## 2016-07-13 DIAGNOSIS — E7849 Other hyperlipidemia: Secondary | ICD-10-CM

## 2016-07-13 LAB — BASIC METABOLIC PANEL
BUN / CREAT RATIO: 11 (ref 10–24)
BUN: 39 mg/dL — AB (ref 8–27)
CO2: 20 mmol/L (ref 18–29)
CREATININE: 3.55 mg/dL — AB (ref 0.76–1.27)
Calcium: 10.1 mg/dL (ref 8.6–10.2)
Chloride: 104 mmol/L (ref 96–106)
GFR calc Af Amer: 20 mL/min/{1.73_m2} — ABNORMAL LOW (ref >59)
GFR, EST NON AFRICAN AMERICAN: 17 mL/min/{1.73_m2} — AB (ref >59)
Glucose: 282 mg/dL — ABNORMAL HIGH (ref 65–99)
Potassium: 4.8 mmol/L (ref 3.5–5.2)
SODIUM: 139 mmol/L (ref 134–144)

## 2016-07-13 MED ORDER — METOLAZONE 5 MG PO TABS: 5.0000 mg | ORAL_TABLET | Freq: Every day | ORAL | 0 refills | Status: DC | PRN

## 2016-07-13 NOTE — Patient Instructions (Addendum)
Medication Instructions:  1) Continue Furosemide 80mg  twice daily. 2) Start Metolazone 5mg  tomorrow morning, 30-60 minutes prior to taking your Furosemide.   Labwork: BMET on Friday  Testing/Procedures: None  Follow-Up: Your physician recommends that you schedule a follow-up appointment in: 1 week with Dr. Tamala Julian (Can have 2/7 @ 10:45A)   Any Other Special Instructions Will Be Listed Below (If Applicable).  Call our office on Thursday and let me know how much you urinated and if your swelling improved after taking the Metolazone.   If you need a refill on your cardiac medications before your next appointment, please call your pharmacy.

## 2016-07-13 NOTE — Progress Notes (Signed)
Cardiology Office Note    Date:  07/13/2016   ID:  Matthew, Wood 09/15/1950, MRN [id number]  PCP:  Matthew Blocker, MD  Cardiologist: Matthew Grooms, MD   Chief Complaint  Patient presents with  . Congestive Heart Failure    History of Present Illness:  Matthew Wood is a 66 y.o. male  with hx of CAD, chronic combined systolic and diastolic heart failure, HTN, HLD, LBBB, multinodular goiter, OSA on CPAP, DM, CVA and CKD stage III-IV. Recently diagnosed to have ejection fraction by echo in the 30-35% range in the setting of ischemic cardiomyopathy.  Since AICD implantation On 05/25/2016, he has begun retaining fluid. He was diuresed down to 298 pounds prior to discharge. We increased furosemide from 40 mg daily to 80 mg twice a day over the past weekend because his weight had increased to 315 pounds. He does not feel that there has been significant diuresis since increasing furosemide.. No lightheadedness or dizziness. Creatinine 3.55, which is slightly up compared to earlier results. He denies chest pain. He is sleeping in a chair.  On the office scales, his weight is up to 315 pounds from 298 pounds in December.  Past Medical History:  Diagnosis Date  . Anemia   . CHF (congestive heart failure) ([place])   . Coronary artery disease    a. LHC 04/27/10 left main patent, 30-40% LAD, mid circumflex 30%, 80% mid RCA accounting for the appearance of an infract/peri-infract ischemia on nuclear testing, LV EF of 30-45%--> medical therapy  . CVA (cerebrovascular accident) ([place]) 2011   potine; "little balance problems since" (05/25/2016)  . Gout   . History of kidney stones    "passed them" (05/25/2016)  . Hyperlipidemia   . Hypertension   . LBBB (left bundle branch block)   . Multinodular goiter   . Obesity   . OSA on CPAP   . Presence of permanent cardiac pacemaker   . Renal insufficiency   . Type II diabetes mellitus ([place])     Past Surgical History:  Procedure Laterality  Date  . COLONOSCOPY WITH PROPOFOL N/A 04/16/2014   Procedure: COLONOSCOPY WITH PROPOFOL;  Surgeon: [name], MD;  Location: [facility];  Service: Endoscopy;  Laterality: N/A;  . EP IMPLANTABLE DEVICE N/A 05/25/2016   Procedure: BiV ICD Insertion CRT-D;  Surgeon: [name], MD;  Location: [place] CV [facility];  Service: Cardiovascular;  Laterality: N/A;  . INSERT / REPLACE / REMOVE PACEMAKER  05/25/2016   biventricular pacemaker  . LAPAROSCOPIC CHOLECYSTECTOMY      Current Medications: Outpatient Medications Prior to Visit  Medication Sig Dispense Refill  . allopurinol (ZYLOPRIM) 300 MG tablet Take 300 mg by mouth daily.    Marland Kitchen amLODipine-benazepril (LOTREL) 10-40 MG per capsule Take 1 capsule by mouth every morning.     . carvedilol (COREG) 25 MG tablet Take 25 mg by mouth 2 (two) times daily with a meal.    . glimepiride (AMARYL) 4 MG tablet Take 4 mg by mouth 2 (two) times daily.    . Insulin Glargine (BASAGLAR KWIKPEN) 100 UNIT/ML SOPN Inject 34 Units into the skin at bedtime.     . insulin lispro (HUMALOG) 100 UNIT/ML injection Inject 20 Units into the skin 2 (two) times daily before a meal.     . isosorbide-hydrALAZINE (BIDIL) 20-37.5 MG per tablet Take 1 tablet by mouth 2 (two) times daily.    . NON FORMULARY cpap as directed    .  potassium chloride (K-DUR,KLOR-CON) 10 MEQ tablet Take 10 mEq by mouth 2 (two) times daily.    . rosuvastatin (CRESTOR) 20 MG tablet Take 20 mg by mouth daily.    . Calcifediol (RAYALDEE PO) Take 1 tablet by mouth daily.    . cloNIDine (CATAPRES) 0.2 MG tablet Take 0.2-1 mg by mouth 2 (two) times daily. 0.1 MG IN THE MORNING AND 0.2 MG AT BEDTIME.    . furosemide (LASIX) 80 MG tablet Take 1 tablet (80 mg total) by mouth 2 (two) times daily. (Patient not taking: Reported on 07/13/2016) 180 tablet 3  . saxagliptin HCl (ONGLYZA) 5 MG TABS tablet Take 5 mg by mouth daily.     No facility-administered medications prior to visit.      Allergies:    Patient has no known allergies.   Social History   Social History  . Marital status: Married    Spouse name: N/A  . Number of children: 1  . Years of education: N/A   Occupational History  . [employer]  . Smoking status: Former Smoker    Packs/day: 0.50    Years: 27.00    Types: Cigarettes    Quit date: 03/25/2010  . Smokeless tobacco: Never Used  . Alcohol use Yes     Comment: 05/25/2016 "was an occasional drinker; maybe 4 drinks/week; stopped after stroke in 2011"  . Drug use: No  . Sexual activity: Not Asked   Other Topics Concern  . None   Social History Narrative  . None     Family History:  The patient's family history includes Breast cancer in his mother; Diabetes type II in his father; Heart attack in his father; Hypertension in his mother and sister; Stroke in his father.   ROS:   Please see the history of present illness.    Lower extremity swelling. No significant reduction in weight since the increase in furosemide dose. 15-18 pound weight gain in one month All other systems reviewed and are negative.   PHYSICAL EXAM:   VS:  BP 140/86 (BP Location: Left Arm)   Pulse 86   Ht 6' (1.829 m)   Wt (!) 315 lb (142.9 kg)   BMI 42.72 kg/m    GEN: Well nourished, well developed, in no acute distress  HEENT: normal  Neck: no JVD, carotid bruits, or masses Cardiac: RRR; no murmurs, rubs, or gallops,no edema  Respiratory:  clear to auscultation bilaterally, normal work of breathing GI: soft, nontender, nondistended, + BS MS: no deformity or atrophy  Skin: warm and dry, no rash Neuro:  Alert and Oriented x 3, Strength and sensation are intact Psych: euthymic mood, full affect  Wt Readings from Last 3 Encounters:  07/13/16 (!) 315 lb (142.9 kg)  05/26/16 298 lb 8.1 oz (135.4 kg)  05/19/16 (!) 308 lb (139.7 kg)      Studies/Labs Reviewed:   EKG:  EKG  Not done  Recent Labs: 02/05/2016: Brain Natriuretic  Peptide 274.2 05/21/2016: Hemoglobin 11.0; Platelets 200 07/12/2016: BUN 39; Creatinine, Ser 3.55; Potassium 4.8; Sodium 139   Lipid Panel    Component Value Date/Time   CHOL  02/08/2010 0600    171        ATP III CLASSIFICATION:  <200     mg/dL   Desirable  200-239  mg/dL   Borderline High  >=240    mg/dL   High  TRIG 248 (H) 02/08/2010 0600   HDL 34 (L) 02/08/2010 0600   CHOLHDL 5.0 02/08/2010 0600   VLDL 50 (H) 02/08/2010 0600   LDLCALC  02/08/2010 0600    87        Total Cholesterol/HDL:CHD Risk Coronary Heart Disease Risk Table                     Men   Women  1/2 Average Risk   3.4   3.3  Average Risk       5.0   4.4  2 X Average Risk   9.6   7.1  3 X Average Risk  23.4   11.0        Use the calculated Patient Ratio above and the CHD Risk Table to determine the patient's CHD Risk.        ATP III CLASSIFICATION (LDL):  <100     mg/dL   Optimal  100-129  mg/dL   Near or Above                    Optimal  130-159  mg/dL   Borderline  160-189  mg/dL   High  >190     mg/dL   Very High    Additional studies/ records that were reviewed today include:  Potassium is 4.8, creatinine 3.55 after 3 days of furosemide 80 mg twice a day.    ASSESSMENT:    1. Chronic systolic heart failure ([place])   2. Coronary artery disease involving native coronary artery of native heart without angina pectoris   3. AICD (automatic cardioverter/defibrillator) present   4. Obstructive sleep apnea syndrome   5. Other hyperlipidemia   6. Essential hypertension      PLAN:  In order of problems listed above:  1. Add metolazone 5 mg, 30 minutes prior to a.m. furosemide 80 mg dose tomorrow.. Continue 80 mg by mouth twice a day. Office visit in 7-10 days with basic metabolic panel. Daily weights on home scales under the same condition. Plan to admit for diuresis if using metolazone does not bring about diuresis. He should call us in 48 hours with a report concerning metolazone  response. 1500 cc fluid restriction. 2. Denies angina. 3. Need to determine if right ventricular pacing is occurring. This may have a role in recent weight gain if it is impairing LV contractility. 4. Recommended adherence to CPap.    Medication Adjustments/Labs and Tests Ordered: Current medicines are reviewed at length with the patient today.  Concerns regarding medicines are outlined above.  Medication changes, Labs and Tests ordered today are listed in the Patient Instructions below. Patient Instructions  Medication Instructions:  1) Continue Furosemide 80mg  twice daily. 2) Start Metolazone 5mg  tomorrow morning, 30-60 minutes prior to taking your Furosemide.   Labwork: BMET on Friday  Testing/Procedures: None  Follow-Up: Your physician recommends that you schedule a follow-up appointment in: 1 week with Dr. Tamala Julian (Can have 2/7 @ 10:45A)   Any Other Special Instructions Will Be Listed Below (If Applicable).  Call our office on Thursday and let me know how much you urinated and if your swelling improved after taking the Metolazone.   If you need a refill on your cardiac medications before your next appointment, please call your pharmacy.      Signed, Matthew Grooms, MD  07/13/2016 11:55 AM    [facility] [place] [place], [place], [place]  [address] Phone: [phone];  Fax: [phone]  

## 2016-07-14 NOTE — Telephone Encounter (Signed)
As of now I do not see where we have received the paper chart, will forward to [place] as a cc just in case it shows up in alva's box

## 2016-07-16 ENCOUNTER — Telehealth: Payer: Self-pay | Admitting: Interventional Cardiology

## 2016-07-16 ENCOUNTER — Other Ambulatory Visit: Payer: 59 | Admitting: *Deleted

## 2016-07-16 DIAGNOSIS — I10 Essential (primary) hypertension: Secondary | ICD-10-CM

## 2016-07-16 NOTE — Telephone Encounter (Signed)
Pt in today to have labs drawn.  Weighed pt while here.  Weight down to 303lbs today.  Was 315lbs at appt on 1/30.  Will route to Dr. Tamala Julian to make him aware.

## 2016-07-16 NOTE — Addendum Note (Signed)
Addended by: Eulis Foster on: 07/16/2016 12:21 PM   Modules accepted: Orders

## 2016-07-17 LAB — BASIC METABOLIC PANEL
BUN / CREAT RATIO: 13 (ref 10–24)
BUN: 51 mg/dL — AB (ref 8–27)
CO2: 23 mmol/L (ref 18–29)
CREATININE: 4.08 mg/dL — AB (ref 0.76–1.27)
Calcium: 11.5 mg/dL — ABNORMAL HIGH (ref 8.6–10.2)
Chloride: 95 mmol/L — ABNORMAL LOW (ref 96–106)
GFR calc Af Amer: 17 mL/min/{1.73_m2} — ABNORMAL LOW (ref >59)
GFR, EST NON AFRICAN AMERICAN: 15 mL/min/{1.73_m2} — AB (ref >59)
Glucose: 182 mg/dL — ABNORMAL HIGH (ref 65–99)
Potassium: 3.9 mmol/L (ref 3.5–5.2)
Sodium: 137 mmol/L (ref 134–144)

## 2016-07-17 NOTE — Telephone Encounter (Signed)
Thanks

## 2016-07-19 ENCOUNTER — Other Ambulatory Visit: Payer: Self-pay | Admitting: *Deleted

## 2016-07-19 DIAGNOSIS — Z79899 Other long term (current) drug therapy: Secondary | ICD-10-CM

## 2016-07-19 NOTE — Telephone Encounter (Signed)
PT'S WIFE  AWARE  PT NEEDS TO COME TO OFFICE  ON Friday  07-23-16  FOR REPEAT  BMET   ANYTIME  BEFORE  4:30 PM .Adonis Housekeeper

## 2016-07-19 NOTE — Telephone Encounter (Signed)
I did not see a paper chart in RA's look-at when I checked for this. Matthew Wood have you received paper chart yet?

## 2016-07-19 NOTE — Telephone Encounter (Signed)
Have not received any paperwork regarding this patient.

## 2016-07-19 NOTE — Telephone Encounter (Signed)
Follow up   Pt wife verbalized that she is calling to speak to rn

## 2016-07-20 ENCOUNTER — Telehealth: Payer: Self-pay | Admitting: Interventional Cardiology

## 2016-07-20 DIAGNOSIS — N184 Chronic kidney disease, stage 4 (severe): Secondary | ICD-10-CM | POA: Insufficient documentation

## 2016-07-20 HISTORY — DX: Chronic kidney disease, stage 4 (severe): N18.4

## 2016-07-20 NOTE — Telephone Encounter (Signed)
Spoke with wife and she inquired about combining appts so pt isn't having to come over twice this week.  Advised I will speak to Dr. Tamala Julian about timing on labs and will call her back and plan accordingly.  Wife appreciative for assistance.

## 2016-07-20 NOTE — Telephone Encounter (Signed)
Spoke with Dr. Tamala Julian and he said ok to do labs tomorrow.  Spoke with pt's wife and made her aware.  She was appreciative for assistance.

## 2016-07-20 NOTE — Telephone Encounter (Signed)
New message   Pt wife calling regarding labs on Friday. She is unsure why a appointment was made for Friday, and he has appt tomorrow with Dr. Tamala Julian. Requesting a call from nurse.

## 2016-07-21 ENCOUNTER — Other Ambulatory Visit (INDEPENDENT_AMBULATORY_CARE_PROVIDER_SITE_OTHER): Payer: 59

## 2016-07-21 ENCOUNTER — Ambulatory Visit (INDEPENDENT_AMBULATORY_CARE_PROVIDER_SITE_OTHER): Payer: 59 | Admitting: Interventional Cardiology

## 2016-07-21 ENCOUNTER — Encounter: Payer: Self-pay | Admitting: Interventional Cardiology

## 2016-07-21 VITALS — BP 146/84 | HR 88 | Ht 72.0 in | Wt 299.8 lb

## 2016-07-21 DIAGNOSIS — Z79899 Other long term (current) drug therapy: Secondary | ICD-10-CM | POA: Diagnosis not present

## 2016-07-21 DIAGNOSIS — I2510 Atherosclerotic heart disease of native coronary artery without angina pectoris: Secondary | ICD-10-CM | POA: Diagnosis not present

## 2016-07-21 DIAGNOSIS — I10 Essential (primary) hypertension: Secondary | ICD-10-CM

## 2016-07-21 DIAGNOSIS — G4730 Sleep apnea, unspecified: Secondary | ICD-10-CM | POA: Diagnosis not present

## 2016-07-21 DIAGNOSIS — I5022 Chronic systolic (congestive) heart failure: Secondary | ICD-10-CM

## 2016-07-21 DIAGNOSIS — N184 Chronic kidney disease, stage 4 (severe): Secondary | ICD-10-CM

## 2016-07-21 LAB — BASIC METABOLIC PANEL
BUN / CREAT RATIO: 17 (ref 10–24)
BUN: 76 mg/dL — AB (ref 8–27)
CO2: 22 mmol/L (ref 18–29)
CREATININE: 4.42 mg/dL — AB (ref 0.76–1.27)
Calcium: 10.5 mg/dL — ABNORMAL HIGH (ref 8.6–10.2)
Chloride: 99 mmol/L (ref 96–106)
GFR calc Af Amer: 15 mL/min/{1.73_m2} — ABNORMAL LOW (ref >59)
GFR calc non Af Amer: 13 mL/min/{1.73_m2} — ABNORMAL LOW (ref >59)
Glucose: 210 mg/dL — ABNORMAL HIGH (ref 65–99)
Potassium: 3.6 mmol/L (ref 3.5–5.2)
Sodium: 143 mmol/L (ref 134–144)

## 2016-07-21 NOTE — Progress Notes (Signed)
Cardiology Office Note    Date:  07/21/2016   ID:  Danon, Lograsso [birth year], MRN [id number]  PCP:  Rogers Blocker, MD  Cardiologist: Sinclair Grooms, MD   Chief Complaint  Patient presents with  . Congestive Heart Failure    History of Present Illness:  Matthew Wood is a 66 y.o. male with hx of CAD, chronic combined systolic and diastolic heart failure, HTN, HLD, LBBB, multinodular goiter, OSA on CPAP, DM, CVA and CKD stage III-IV. Recently diagnosed to have ejection fraction by echo in the 30-35% range in the setting of ischemic cardiomyopathy.  [place] feels much better. Lower extremity swelling has resolved. He denies chest pain. He denies orthopnea and has not had palpitations.   Past Medical History:  Diagnosis Date  . Anemia   . CHF (congestive heart failure) ([place])   . Coronary artery disease    a. LHC 04/27/10 left main patent, 30-40% LAD, mid circumflex 30%, 80% mid RCA accounting for the appearance of an infract/peri-infract ischemia on nuclear testing, LV EF of 30-45%--> medical therapy  . CVA (cerebrovascular accident) ([place]) 2011   potine; "little balance problems since" (05/25/2016)  . Gout   . History of kidney stones    "passed them" (05/25/2016)  . Hyperlipidemia   . Hypertension   . LBBB (left bundle branch block)   . Multinodular goiter   . Obesity   . OSA on CPAP   . Presence of permanent cardiac pacemaker   . Renal insufficiency   . Type II diabetes mellitus ([place])     Past Surgical History:  Procedure Laterality Date  . COLONOSCOPY WITH PROPOFOL N/A 04/16/2014   Procedure: COLONOSCOPY WITH PROPOFOL;  Surgeon: [name], MD;  Location: [facility];  Service: Endoscopy;  Laterality: N/A;  . EP IMPLANTABLE DEVICE N/A 05/25/2016   Procedure: BiV ICD Insertion CRT-D;  Surgeon: [name], MD;  Location: [facility];  Service: Cardiovascular;  Laterality: N/A;  . INSERT / REPLACE / REMOVE PACEMAKER  05/25/2016   biventricular pacemaker  . LAPAROSCOPIC CHOLECYSTECTOMY      Current Medications: Outpatient Medications Prior to Visit  Medication Sig Dispense Refill  . allopurinol (ZYLOPRIM) 300 MG tablet Take 300 mg by mouth daily.    Marland Kitchen amLODipine-benazepril (LOTREL) 10-40 MG per capsule Take 1 capsule by mouth every morning.     . carvedilol (COREG) 25 MG tablet Take 25 mg by mouth 2 (two) times daily with a meal.    . cloNIDine (CATAPRES) 0.2 MG tablet Take 0.5 tablet (0.1 mg total) by mouth each morning. Take 1 tablet (0.2 mg total) by mouth each evening.    . furosemide (LASIX) 40 MG tablet Take 0.5 tablet (20 mg total) by mouth each morning. Take 1 tablet (40 mg total) by mouth each evening.    Marland Kitchen glimepiride (AMARYL) 4 MG tablet Take 4 mg by mouth 2 (two) times daily.    . Insulin Glargine (BASAGLAR KWIKPEN) 100 UNIT/ML SOPN Inject 34 Units into the skin at bedtime.     . insulin lispro (HUMALOG) 100 UNIT/ML injection Inject 20 Units into the skin 2 (two) times daily before a meal.     . isosorbide-hydrALAZINE (BIDIL) 20-37.5 MG per tablet Take 1 tablet by mouth 2 (two) times daily.    . metolazone (ZAROXOLYN) 5 MG tablet Take 1 tablet (5 mg total) by mouth daily as needed. 5 tablet 0  . NON FORMULARY cpap as directed    .  potassium chloride (K-DUR,KLOR-CON) 10 MEQ tablet Take 10 mEq by mouth 2 (two) times daily.    . rosuvastatin (CRESTOR) 20 MG tablet Take 20 mg by mouth daily.     No facility-administered medications prior to visit.      Allergies:   Patient has no known allergies.   Social History   Social History  . Marital status: Married    Spouse name: N/A  . Number of children: 1  . 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 Smoking status: Former Smoker    Packs/day: 0.50    Years: 27.00    Types: Cigarettes    Quit date: 03/25/2010  . Smokeless tobacco: Never Used  . Alcohol use Yes     Comment:  05/25/2016 "was an occasional drinker; maybe 4 drinks/week; stopped after stroke in 2011"  . Drug use: No  . Sexual activity: Not on file   Other Topics Concern  . Not on file   Social History Narrative  . No narrative on file     Family History:  The patient's family history includes Breast cancer in his mother; Diabetes type II in his father; Heart attack in his father; Hypertension in his mother and sister; Stroke in his father.   ROS:   Please see the history of present illness.    Having difficulty maintaining a low salt diet.  All other systems reviewed and are negative.   PHYSICAL EXAM:   VS:  BP (!) 146/84 (BP Location: Right Arm)   Pulse 88   Ht 6' (1.829 m)   Wt 299 lb 12.8 oz (136 kg)   BMI 40.66 kg/m    GEN: Well nourished, well developed, in no acute distress  HEENT: normal  Neck: no JVD, carotid bruits, or masses Cardiac: RRR; no murmurs, rubs, or gallops,no edema . Neck veins are now flat. Respiratory:  clear to auscultation bilaterally, normal work of breathing GI: soft, nontender, nondistended, + BS MS: no deformity or atrophy  Skin: warm and dry, no rash Neuro:  Alert and Oriented x 3, Strength and sensation are intact Psych: euthymic mood, full affect  Wt Readings from Last 3 Encounters:  07/21/16 299 lb 12.8 oz (136 kg)  07/13/16 (!) 315 lb (142.9 kg)  05/26/16 298 lb 8.1 oz (135.4 kg)      Studies/Labs Reviewed:   EKG:  EKG  Not repeated  Recent Labs: 02/05/2016: Brain Natriuretic Peptide 274.2 05/21/2016: Hemoglobin 11.0; Platelets 200 07/16/2016: BUN 51; Creatinine, Ser 4.08; Potassium 3.9; Sodium 137   Lipid Panel    Component Value Date/Time   CHOL  02/08/2010 0600    171        ATP III CLASSIFICATION:  <200     mg/dL   Desirable  200-239  mg/dL   Borderline High  >=240    mg/dL   High          TRIG 248 (H) 02/08/2010 0600   HDL 34 (L) 02/08/2010 0600   CHOLHDL 5.0 02/08/2010 0600   VLDL 50 (H) 02/08/2010 0600   LDLCALC   02/08/2010 0600    87        Total Cholesterol/HDL:CHD Risk Coronary Heart Disease Risk Table                     Men   Women  1/2 Average Risk   3.4   3.3  Average Risk       5.0   4.4  2 X Average Risk   9.6   7.1  3 X Average Risk  23.4   11.0        Use the calculated Patient Ratio above and the CHD Risk Table to determine the patient's CHD Risk.        ATP III CLASSIFICATION (LDL):  <100     mg/dL   Optimal  100-129  mg/dL   Near or Above                    Optimal  130-159  mg/dL   Borderline  160-189  mg/dL   High  >190     mg/dL   Very High    Additional studies/ records that were reviewed today include:  No new data. Most recent a sig metabolic panel after metolazone revealed a BUN of 50 and a creatinine of 4.08.    ASSESSMENT:    1. Chronic systolic heart failure ([place])   2. Coronary artery disease involving native coronary artery of native heart without angina pectoris   3. CKD (chronic kidney disease), stage IV ([place])   4. Essential hypertension   5. Sleep apnea, unspecified type      PLAN:  In order of problems listed above:  1. Volume removal has been successful after increasing furosemide from 40 mg twice a day to 80 mg twice a day and adding metolazone 5 mg per day for 4 days. There has been deterioration in kidney function with creatinine climbing to 4.0 and BUN 51. Metolazone has been discontinued. A basic metabolic panel will be performed today. He is encouraged to weigh daily and to notify if increase in weight by greater than 3 pounds. New dry weight is 298-300 pounds. 2. No anginal complaints. 3. Most recent creatinine 4.0 after 4 days of metolazone. Metolazone was discontinued 2 days ago.. Repeat basic metabolic panel today on his current diuretic regimen of 80 mg twice a day furosemide.  Plan clinical follow-up in 4 weeks and basic metabolic panel at that time. For information to [place].    Medication Adjustments/Labs and Tests  Ordered: Current medicines are reviewed at length with the patient today.  Concerns regarding medicines are outlined above.  Medication changes, Labs and Tests ordered today are listed in the Patient Instructions below. Patient Instructions  Medication Instructions:  None  Labwork: None  Testing/Procedures: None  Follow-Up: Your physician recommends that you schedule a follow-up appointment in: 1 month with Dr. Tamala Julian.    Any Other Special Instructions Will Be Listed Below (If Applicable).  Dr. Tamala Julian would like for you to monitor your weight daily.  Weigh each day at the same time and in the same amount of clothing.  Call if you see that your weight is trending up.     If you need a refill on your cardiac medications before your next appointment, please call your pharmacy.      Signed, Sinclair Grooms, MD  07/21/2016 11:23 AM    [facility] [address], [place], [place]  [address] Phone: [phone]; Fax: [phone]

## 2016-07-21 NOTE — Patient Instructions (Signed)
Medication Instructions:  None  Labwork: None  Testing/Procedures: None  Follow-Up: Your physician recommends that you schedule a follow-up appointment in: 1 month with Dr. Tamala Julian.    Any Other Special Instructions Will Be Listed Below (If Applicable).  Dr. Tamala Julian would like for you to monitor your weight daily.  Weigh each day at the same time and in the same amount of clothing.  Call if you see that your weight is trending up.     If you need a refill on your cardiac medications before your next appointment, please call your pharmacy.

## 2016-07-22 NOTE — Telephone Encounter (Signed)
Have we received this paper chart yet?

## 2016-07-22 NOTE — Telephone Encounter (Signed)
Still no paperwork   

## 2016-07-23 ENCOUNTER — Ambulatory Visit: Payer: 59 | Admitting: Interventional Cardiology

## 2016-07-23 ENCOUNTER — Other Ambulatory Visit: Payer: 59

## 2016-07-27 NOTE — Telephone Encounter (Signed)
Records have been placed in RA's look at. Wallene Dales is Aware. Crystal with Dr. Randel Pigg office did states there was not a sleep study on file.  Will route to Cherina to f/u on.

## 2016-07-27 NOTE — Telephone Encounter (Signed)
Paper chart has not be received yet.  I have Crystal with Dr. Forbes Cellar, who states she will refax pt's records over. Will await fax.

## 2016-08-04 ENCOUNTER — Telehealth: Payer: Self-pay | Admitting: Pulmonary Disease

## 2016-08-04 NOTE — Telephone Encounter (Signed)
Summarizing all previous phone notes- We have been unable to track down his sleep study- we have contacted DME, and his PCP Matthew Wood Prescription for new CPAP 16 cm has been sent to [facility] Please check with DME and the patient has received this yet

## 2016-08-04 NOTE — Telephone Encounter (Signed)
The papers you have received on this pt were they faxed to pt's DME

## 2016-08-04 NOTE — Telephone Encounter (Signed)
Will close this note as the papers have been received and there is no sign of a sleep study. Per Dr. Elsworth Soho, [facility] already has the info that they need.

## 2016-08-23 ENCOUNTER — Encounter (INDEPENDENT_AMBULATORY_CARE_PROVIDER_SITE_OTHER): Payer: Self-pay

## 2016-08-23 ENCOUNTER — Encounter: Payer: Self-pay | Admitting: Interventional Cardiology

## 2016-08-23 ENCOUNTER — Ambulatory Visit (INDEPENDENT_AMBULATORY_CARE_PROVIDER_SITE_OTHER): Payer: 59 | Admitting: Interventional Cardiology

## 2016-08-23 VITALS — BP 124/72 | HR 86 | Ht 72.0 in | Wt 301.4 lb

## 2016-08-23 DIAGNOSIS — I2510 Atherosclerotic heart disease of native coronary artery without angina pectoris: Secondary | ICD-10-CM | POA: Diagnosis not present

## 2016-08-23 DIAGNOSIS — I5022 Chronic systolic (congestive) heart failure: Secondary | ICD-10-CM | POA: Diagnosis not present

## 2016-08-23 DIAGNOSIS — I10 Essential (primary) hypertension: Secondary | ICD-10-CM

## 2016-08-23 DIAGNOSIS — N184 Chronic kidney disease, stage 4 (severe): Secondary | ICD-10-CM

## 2016-08-23 DIAGNOSIS — I720 Aneurysm of carotid artery: Secondary | ICD-10-CM | POA: Diagnosis not present

## 2016-08-23 DIAGNOSIS — G4730 Sleep apnea, unspecified: Secondary | ICD-10-CM

## 2016-08-23 MED ORDER — FUROSEMIDE 40 MG PO TABS
40.0000 mg | ORAL_TABLET | Freq: Two times a day (BID) | ORAL | 3 refills | Status: DC
Start: 2016-08-23 — End: 2016-11-25

## 2016-08-23 NOTE — Patient Instructions (Signed)
Medication Instructions:  1) Furosemide 40mg  twice daily  Labwork: Your physician recommends that you return for lab work in: 1 month (BMET)   Testing/Procedures: None  Follow-Up: Your physician recommends that you schedule a follow-up appointment in: 3 months with a PA or NP  Your physician wants you to follow-up in: 6 months with Dr. Tamala Julian.  You will receive a reminder letter in the mail two months in advance. If you don't receive a letter, please call our office to schedule the follow-up appointment.    Any Other Special Instructions Will Be Listed Below (If Applicable).     If you need a refill on your cardiac medications before your next appointment, please call your pharmacy.

## 2016-08-23 NOTE — Progress Notes (Signed)
Cardiology Office Note    Date:  08/23/2016   ID:  Matthew Wood, Matthew Wood July 22, 1950, MRN [id number]  PCP:  Matthew Blocker, MD  Cardiologist: Matthew Grooms, MD   Chief Complaint  Patient presents with  . Congestive Heart Failure    History of Present Illness:  Matthew Wood is a 66 y.o. male with hx of CAD, chronic combined systolic and diastolic heart failure, HTN, HLD, LBBB, multinodular goiter, OSA on CPAP, DM, CVA and CKD stage III-IV. Recently diagnosed to have ejection fraction by echo in the 30-35% range in the setting of ischemic cardiomyopathy.  He is clinically stable. Weight has been around 300 pounds. He denies orthopnea and PND. According to his wife they have 20 mg furosemide tablets of which he takes 2 tablets twice a day. He has not currently using metolazone. He is able to lie flat in bed.   Past Medical History:  Diagnosis Date  . Anemia   . CHF (congestive heart failure) ([place])   . Coronary artery disease    a. LHC 04/27/10 left main patent, 30-40% LAD, mid circumflex 30%, 80% mid RCA accounting for the appearance of an infract/peri-infract ischemia on nuclear testing, LV EF of 30-45%--> medical therapy  . CVA (cerebrovascular accident) ([place]) 2011   potine; "little balance problems since" (05/25/2016)  . Gout   . History of kidney stones    "passed them" (05/25/2016)  . Hyperlipidemia   . Hypertension   . LBBB (left bundle branch block)   . Multinodular goiter   . Obesity   . OSA on CPAP   . Presence of permanent cardiac pacemaker   . Renal insufficiency   . Type II diabetes mellitus ([place])     Past Surgical History:  Procedure Laterality Date  . COLONOSCOPY WITH PROPOFOL N/A 04/16/2014   Procedure: COLONOSCOPY WITH PROPOFOL;  Surgeon: Matthew Fair, MD;  [facility];  Service: Endoscopy;  Laterality: N/A;  . EP IMPLANTABLE DEVICE N/A 05/25/2016   Procedure: BiV ICD Insertion CRT-D;  Surgeon: Matthew Meredith Leeds, MD;  Location: [facility];  Service: Cardiovascular;  Laterality: N/A;  . INSERT / REPLACE / REMOVE PACEMAKER  05/25/2016   biventricular pacemaker  . LAPAROSCOPIC CHOLECYSTECTOMY      Current Medications: Outpatient Medications Prior to Visit  Medication Sig Dispense Refill  . allopurinol (ZYLOPRIM) 300 MG tablet Take 300 mg by mouth daily.    Marland Kitchen amLODipine-benazepril (LOTREL) 10-40 MG per capsule Take 1 capsule by mouth every morning.     . carvedilol (COREG) 25 MG tablet Take 25 mg by mouth 2 (two) times daily with a meal.    . cloNIDine (CATAPRES) 0.2 MG tablet Take 0.5 tablet (0.1 mg total) by mouth each morning. Take 1 tablet (0.2 mg total) by mouth each evening.    . furosemide (LASIX) 40 MG tablet Take 0.5 tablet (20 mg total) by mouth each morning. Take 1 tablet (40 mg total) by mouth each evening.    Marland Kitchen glimepiride (AMARYL) 4 MG tablet Take 4 mg by mouth 2 (two) times daily.    . Insulin Glargine (BASAGLAR KWIKPEN) 100 UNIT/ML SOPN Inject 34 Units into the skin at bedtime.     . insulin lispro (HUMALOG) 100 UNIT/ML injection Inject 20 Units into the skin 2 (two) times daily before a meal.     . isosorbide-hydrALAZINE (BIDIL) 20-37.5 MG per tablet Take 1 tablet by mouth 2 (two) times daily.    Marland Kitchen  metolazone (ZAROXOLYN) 5 MG tablet Take 1 tablet (5 mg total) by mouth daily as needed. 5 tablet 0  . NON FORMULARY cpap as directed    . potassium chloride (K-DUR,KLOR-CON) 10 MEQ tablet Take 10 mEq by mouth 2 (two) times daily.    . rosuvastatin (CRESTOR) 20 MG tablet Take 20 mg by mouth daily.     No facility-administered medications prior to visit.      Allergies:   Patient has no known allergies.   Social History   Social History  . Marital status: Married    Spouse name: N/A  . Number of children: 1  . 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 Smoking status: Former Smoker    Packs/day: 0.50    Years: 27.00     Types: Cigarettes    Quit date: 03/25/2010  . Smokeless tobacco: Never Used  . Alcohol use Yes     Comment: 05/25/2016 "was an occasional drinker; maybe 4 drinks/week; stopped after stroke in 2011"  . Drug use: No  . Sexual activity: Not Asked   Other Topics Concern  . None   Social History Narrative  . None     Family History:  The patient's family history includes Breast cancer in his mother; Diabetes type II in his father; Heart attack in his father; Hypertension in his mother and sister; Stroke in his father.   ROS:   Please see the history of present illness.    Lower back discomfort. Otherwise no planes  All other systems reviewed and are negative.   PHYSICAL EXAM:   VS:  BP 124/72 (BP Location: Left Arm)   Pulse 86   Ht 6' (1.829 m)   Wt (!) 301 lb 6.4 oz (136.7 kg)   BMI 40.88 kg/m    GEN: Well nourished, well developed, in no acute distress  HEENT: normal  Neck: no JVD, carotid bruits, or masses Cardiac: RRR; no murmurs, rubs, or gallops,no edema  Respiratory:  clear to auscultation bilaterally, normal work of breathing GI: soft, nontender, nondistended, + BS MS: no deformity or atrophy  Skin: warm and dry, no rash Neuro:  Alert and Oriented x 3, Strength and sensation are intact Psych: euthymic mood, full affect  Wt Readings from Last 3 Encounters:  08/23/16 (!) 301 lb 6.4 oz (136.7 kg)  07/21/16 299 lb 12.8 oz (136 kg)  07/13/16 (!) 315 lb (142.9 kg)      Studies/Labs Reviewed:   EKG:  EKG  Not repeated  Recent Labs: 02/05/2016: Brain Natriuretic Peptide 274.2 05/21/2016: Hemoglobin 11.0; Platelets 200 07/21/2016: BUN 76; Creatinine, Ser 4.42; Potassium 3.6; Sodium 143   Lipid Panel    Component Value Date/Time   CHOL  02/08/2010 0600    171        ATP III CLASSIFICATION:  <200     mg/dL   Desirable  200-239  mg/dL   Borderline High  >=240    mg/dL   High          TRIG 248 (H) 02/08/2010 0600   HDL 34 (L) 02/08/2010 0600   CHOLHDL 5.0  02/08/2010 0600   VLDL 50 (H) 02/08/2010 0600   LDLCALC  02/08/2010 0600    87        Total Cholesterol/HDL:CHD Risk Coronary Heart Disease Risk Table  Men   Women  1/2 Average Risk   3.4   3.3  Average Risk       5.0   4.4  2 X Average Risk   9.6   7.1  3 X Average Risk  23.4   11.0        Use the calculated Patient Ratio above and the CHD Risk Table to determine the patient's CHD Risk.        ATP III CLASSIFICATION (LDL):  <100     mg/dL   Optimal  100-129  mg/dL   Near or Above                    Optimal  130-159  mg/dL   Borderline  160-189  mg/dL   High  >190     mg/dL   Very High    Additional studies/ records that were reviewed today include:  No new data except most recent basic metabolic panel revealed BUN 76 creatinine 4.42 with potassium of 3.6.    ASSESSMENT:    1. Coronary artery disease involving native coronary artery of native heart without angina pectoris   2. Chronic systolic heart failure ([place])   3. Essential hypertension   4. Carotid aneurysm, left ([place])   5. CKD (chronic kidney disease), stage IV ([place])   6. Sleep apnea, unspecified type      PLAN:  In order of problems listed above:  1. Stable without angina 2. Currently taking 40 mg twice daily of furosemide. Basic metabolic panel in 1 month. Call if fluid retention or increase in weight greater than 3 pounds in one day or 5 pounds in one week. 3. Excellent blood pressure control.   Overall doing well. Basic metabolic panel Matthew be done in one month. Follow-up with team member to check volume status in 3 months and with me in 6 months. PAF had use metolazone in the past to mobilize fluid. He is not currently taken metolazone daily.    Medication Adjustments/Labs and Tests Ordered: Current medicines are reviewed at length with the patient today.  Concerns regarding medicines are outlined above.  Medication changes, Labs and Tests ordered today are listed in the Patient  Instructions below. There are no Patient Instructions on file for this visit.   Signed, Matthew Grooms, MD  08/23/2016 11:02 AM    [facility] [place], [place], [place]  [address] Phone: [phone]; Fax: [phone]

## 2016-09-08 ENCOUNTER — Ambulatory Visit (INDEPENDENT_AMBULATORY_CARE_PROVIDER_SITE_OTHER): Payer: 59 | Admitting: Cardiology

## 2016-09-08 ENCOUNTER — Encounter: Payer: Self-pay | Admitting: Cardiology

## 2016-09-08 ENCOUNTER — Encounter (INDEPENDENT_AMBULATORY_CARE_PROVIDER_SITE_OTHER): Payer: Self-pay

## 2016-09-08 VITALS — BP 132/76 | HR 65 | Ht 72.0 in | Wt 313.8 lb

## 2016-09-08 DIAGNOSIS — I255 Ischemic cardiomyopathy: Secondary | ICD-10-CM

## 2016-09-08 DIAGNOSIS — Z95810 Presence of automatic (implantable) cardiac defibrillator: Secondary | ICD-10-CM

## 2016-09-08 DIAGNOSIS — I5022 Chronic systolic (congestive) heart failure: Secondary | ICD-10-CM | POA: Diagnosis not present

## 2016-09-08 DIAGNOSIS — Z79899 Other long term (current) drug therapy: Secondary | ICD-10-CM | POA: Diagnosis not present

## 2016-09-08 MED ORDER — METOLAZONE 5 MG PO TABS: 5.0000 mg | ORAL_TABLET | Freq: Every day | ORAL | 0 refills | Status: DC | PRN

## 2016-09-08 NOTE — Progress Notes (Addendum)
Electrophysiology Office Note   Date:  09/08/2016   ID:  Matthew Wood, Matthew Wood, MRN [id number]  PCP:  Matthew Blocker, MD  Cardiologist:  Matthew Wood Primary Electrophysiologist:  Matthew Humble Meredith Leeds, MD    Chief Complaint  Patient presents with  . DEFIB CHECK    Ischemic cardiomyopathy     History of Present Illness: Matthew Wood is a 66 y.o. male who presents today for electrophysiology evaluation.   Hx CAD, chronic combined systolic and diastolic heart failure, HTN, HLD, LBBB, multinodular goiter, OSA on CPAP, DM, CVA and CKD stage III-IV.   LHC 04/27/10 for decreased LV function and recent stroke: left main patent, 30-40% LAD, mid circumflex 30%, 80% mid RCA accounting for the appearance of an infract/peri-infract ischemia on nuclear testing, LV EF of 30-45%--> medical therapy recommended to avoid further contrast use that may impair renal function. Had CRT-D implant 06/03/16.  Today, he denies symptoms of palpitations, chest pain, orthopnea, PND, lower extremity edema, claudication, dizziness, presyncope, syncope, bleeding, or neurologic sequela. The patient is tolerating medications without difficulties and is otherwise without complaint today.   He continues to be short of breath. He says that he has gained 10 pounds in the last month. He feels like this is all due to fluid weight. He says that his legs have been quite swollen. He is generally okay during the day, but when he lays flat, he gets short of breath. He has been sleeping in a recliner.   Past Medical History:  Diagnosis Date  . Anemia   . CHF (congestive heart failure) ([place])   . Coronary artery disease    a. LHC 04/27/10 left main patent, 30-40% LAD, mid circumflex 30%, 80% mid RCA accounting for the appearance of an infract/peri-infract ischemia on nuclear testing, LV EF of 30-45%--> medical therapy  . CVA (cerebrovascular accident) ([place]) 2011   potine; "little balance problems since" (05/25/2016)  . Gout     . History of kidney stones    "passed them" (05/25/2016)  . Hyperlipidemia   . Hypertension   . LBBB (left bundle branch block)   . Multinodular goiter   . Obesity   . OSA on CPAP   . Presence of permanent cardiac pacemaker   . Renal insufficiency   . Type II diabetes mellitus ([place])    Past Surgical History:  Procedure Laterality Date  . COLONOSCOPY WITH PROPOFOL N/A 04/16/2014   Procedure: COLONOSCOPY WITH PROPOFOL;  Surgeon: Matthew Fair, MD;  Location: [facility];  Service: Endoscopy;  Laterality: N/A;  . EP IMPLANTABLE DEVICE N/A 05/25/2016   Procedure: BiV ICD Insertion CRT-D;  Surgeon: Matthew Petruzzi Meredith Leeds, MD;  Location: [facility];  Service: Cardiovascular;  Laterality: N/A;  . INSERT / REPLACE / REMOVE PACEMAKER  05/25/2016   biventricular pacemaker  . LAPAROSCOPIC CHOLECYSTECTOMY       Current Outpatient Prescriptions  Medication Sig Dispense Refill  . allopurinol (ZYLOPRIM) 300 MG tablet Take 300 mg by mouth daily.    Marland Kitchen amLODipine-benazepril (LOTREL) 10-40 MG per capsule Take 1 capsule by mouth every morning.     . carvedilol (COREG) 25 MG tablet Take 25 mg by mouth 2 (two) times daily with a meal.    . cloNIDine (CATAPRES) 0.2 MG tablet Take 0.5 tablet (0.1 mg total) by mouth each morning. Take 1 tablet (0.2 mg total) by mouth each evening.    . furosemide (LASIX) 40 MG tablet Take 1 tablet (40 mg total) by mouth  2 (two) times daily. 180 tablet 3  . glimepiride (AMARYL) 4 MG tablet Take 4 mg by mouth 2 (two) times daily.    . Insulin Glargine (BASAGLAR KWIKPEN) 100 UNIT/ML SOPN Inject 34 Units into the skin at bedtime.     . insulin lispro (HUMALOG) 100 UNIT/ML injection Inject 20 Units into the skin 2 (two) times daily before a meal.     . isosorbide-hydrALAZINE (BIDIL) 20-37.5 MG per tablet Take 1 tablet by mouth 2 (two) times daily.    . metolazone (ZAROXOLYN) 5 MG tablet Take 1 tablet (5 mg total) by mouth daily as needed. 10 tablet 0  . NON FORMULARY  cpap as directed    . potassium chloride (K-DUR,KLOR-CON) 10 MEQ tablet Take 10 mEq by mouth 2 (two) times daily.    . rosuvastatin (CRESTOR) 20 MG tablet Take 20 mg by mouth daily.     No current facility-administered medications for this visit.     Allergies:   Patient has no known allergies.   Social History:  The patient  reports that he quit smoking about 6 years ago. His smoking use included Cigarettes. He has a 13.50 pack-year smoking history. He has never used smokeless tobacco. He reports that he drinks alcohol. He reports that he does not use drugs.   Family History:  The patient's family history includes Breast cancer in his mother; Diabetes type II in his father; Heart attack in his father; Hypertension in his mother and sister; Stroke in his father.    ROS:  Please see the history of present illness.   Otherwise, review of systems is positive for SOB.   All other systems are reviewed and negative.    PHYSICAL EXAM: VS:  BP 132/76   Pulse 65   Ht 6' (1.829 m)   Wt (!) 313 lb 12.8 oz (142.3 kg)   BMI 42.56 kg/m  , BMI Body mass index is 42.56 kg/m. GEN: Well nourished, well developed, in no acute distress  HEENT: normal  Neck: no JVD, carotid bruits, or masses Cardiac:  RRR, no murmurs, rubs, or gallops, 2+ LE edema  Respiratory:  clear to auscultation bilaterally, normal work of breathing GI: soft, nontender, nondistended, + BS MS: no deformity or atrophy  Skin: warm and dry, device site well healed Neuro:  Strength and sensation are intact Psych: euthymic mood, full affect  EKG:  EKG is ordered today. Personal review of the ekg ordered shows sinus rhythm, V pacing  Pacemaker interrogation personally reviewed today. See results in [place].  Recent Labs: 02/05/2016: Brain Natriuretic Peptide 274.2 05/21/2016: Hemoglobin 11.0; Platelets 200 07/21/2016: BUN 76; Creatinine, Ser 4.42; Potassium 3.6; Sodium 143    Lipid Panel     Component Value Date/Time   CHOL   02/08/2010 0600    171        ATP III CLASSIFICATION:  <200     mg/dL   Desirable  200-239  mg/dL   Borderline High  >=240    mg/dL   High          TRIG 248 (H) 02/08/2010 0600   HDL 34 (L) 02/08/2010 0600   CHOLHDL 5.0 02/08/2010 0600   VLDL 50 (H) 02/08/2010 0600   LDLCALC  02/08/2010 0600    87        Total Cholesterol/HDL:CHD Risk Coronary Heart Disease Risk Table                     Men  Women  1/2 Average Risk   3.4   3.3  Average Risk       5.0   4.4  2 X Average Risk   9.6   7.1  3 X Average Risk  23.4   11.0        Use the calculated Patient Ratio above and the CHD Risk Table to determine the patient's CHD Risk.        ATP III CLASSIFICATION (LDL):  <100     mg/dL   Optimal  100-129  mg/dL   Near or Above                    Optimal  130-159  mg/dL   Borderline  160-189  mg/dL   High  >190     mg/dL   Very High     Wt Readings from Last 3 Encounters:  09/08/16 (!) 313 lb 12.8 oz (142.3 kg)  08/23/16 (!) 301 lb 6.4 oz (136.7 kg)  07/21/16 299 lb 12.8 oz (136 kg)      Other studies Reviewed: Additional studies/ records that were reviewed today include: TTE 02/19/16  Review of the above records today demonstrates:  - Left ventricle: The cavity size was moderately dilated. Wall   thickness was increased in a pattern of mild LVH. Systolic   function was moderately to severely reduced. The estimated   ejection fraction was in the range of 30% to 35%. Diffuse   hypokinesis. Features are consistent with a pseudonormal left   ventricular filling pattern, with concomitant abnormal relaxation   and increased filling pressure (grade 2 diastolic dysfunction). - Aortic valve: There was no stenosis. There was mild   regurgitation. - Aorta: Mildly dilated aortic root and ascending aorta. Aortic   root dimension: 39 mm (ED). Ascending aortic diameter: 42 mm (S). - Mitral valve: There was mild regurgitation. - Left atrium: The atrium was moderately dilated. - Right  ventricle: The cavity size was normal. Systolic function   was normal. - Right atrium: The atrium was mildly dilated. - Pulmonary arteries: No complete TR doppler jet so unable to   estimate PA systolic pressure. - Systemic veins: IVC not fully visualized. - Pericardium, extracardiac: A trivial pericardial effusion was   identified posterior to the heart.   ASSESSMENT AND PLAN:  Chronic combined CHF: Most recent echocardiogram showed a ejection fraction of 30-35%. He is on optimal medical therapy and therefore does qualify for defibrillator placement. Had CRT-D implant 06/03/16. He is volume overloaded today with lower extremity edema on exam. He is also having PND type symptoms. We'll plan to start him on a four-day course of metolazone 2.5 mg daily prior to his dose of Lasix. In the past this has worked well. We'll get a basic metabolic on Monday.  Coronary artery disease: No chest pain. [facility] 04/2010: eft main patent, 30-40% LAD, mid circumflex 30%, 80% mid RCA  --> treated medically.   Essential hypertension:  well controlled today  Hyperlipidemia: Continue statin.   Sleep apnea:On CPAP  Diabetes mellitus Type II: Followed by Dr. Buddy Duty  Chronic kidney disease, stage III-IV: Followed by Dr. Marval Regal    Current medicines are reviewed at length with the patient today.   The patient does not have concerns regarding his medicines.  The following changes were made today:  Metolazone 2.5 mg x4 days  Labs/ tests ordered today include:  Orders Placed This Encounter  Procedures  . Basic metabolic panel  . EKG 12-Lead  Disposition:   FU with Rocio Wolak 9 months  Signed, Savilla Turbyfill Meredith Leeds, MD  09/08/2016 3:10 PM     [address] [place] [address] [address] [phone] (office) [phone] (fax)

## 2016-09-08 NOTE — Patient Instructions (Addendum)
Medication Instructions:   Your physician has recommended you make the following change in your medication:  1) TAKE Metolazone 2.5 mg once daily for 4 days.  Then take 5 mg as needed.  --- If you need a refill on your cardiac medications before your next appointment, please call your pharmacy. ---  Labwork:  Your physician recommends that you return for lab work on Monday for BMET  Testing/Procedures:  None ordered  Follow-Up: Your physician recommends that you schedule a follow-up appointment in: 2 weeks with Truitt Merle, Cecilie Kicks or Nell Range   Remote monitoring is used to monitor your Pacemaker of ICD from home. This monitoring reduces the number of office visits required to check your device to one time per year. It allows [place] to keep an eye on the functioning of your device to ensure it is working properly. You are scheduled for a device check from home on 12/08/2016. You may send your transmission at any time that day. If you have a wireless device, the transmission will be sent automatically. After your physician reviews your transmission, you will receive a postcard with your next transmission date.   Your physician wants you to follow-up in: 9 months with Dr. Curt Bears.  You will receive a reminder letter in the mail two months in advance. If you don't receive a letter, please call our office to schedule the follow-up appointment.   Any Other Special Instructions Will Be Listed Below (If Applicable).    Thank you for choosing [facility]!!   Trinidad Curet, RN [phone]

## 2016-09-10 ENCOUNTER — Telehealth: Payer: Self-pay | Admitting: Cardiology

## 2016-09-10 NOTE — Telephone Encounter (Signed)
New message      Pt c/o medication issue:  1. Name of Medication: metolazone  2. How are you currently taking this medication (dosage and times per day)? 5mg  3. Are you having a reaction (difficulty breathing--STAT)? no  4. What is your medication issue? Pt is taking 1/2 pill and is still having swelling in both hands and feet.  Pt denies sob.  Can he increase his metolazone?

## 2016-09-13 ENCOUNTER — Other Ambulatory Visit: Payer: Self-pay | Admitting: Cardiology

## 2016-09-13 ENCOUNTER — Other Ambulatory Visit: Payer: Self-pay | Admitting: *Deleted

## 2016-09-13 DIAGNOSIS — Z79899 Other long term (current) drug therapy: Secondary | ICD-10-CM

## 2016-09-13 NOTE — Telephone Encounter (Signed)
States BLEE slightly improved, but still pretty swollen.  Asking about more Metolazone tablets to be sent in for refill.  Informed that Dr. Curt Bears will review lab work before making a decision to continue medication.  Wife understands I will call her once reviewed by the physician and discuss next step in plan of care. She verbalized understanding and agreeable to plan.

## 2016-09-13 NOTE — Telephone Encounter (Signed)
Follow up   Pt wife is calling to follow up about his medication

## 2016-09-17 LAB — BASIC METABOLIC PANEL
BUN/Creatinine Ratio: 14
BUN: 63 mg/dL
CALCIUM: 10.3 mg/dL
CO2: 23 mmol/L (ref 18–29)
CREATININE: 4.49 mg/dL
Chloride: 98 mmol/L (ref 96–106)
GLUCOSE: 240 mg/dL — AB (ref 65–99)
Potassium: 4.3 mmol/L (ref 3.5–5.2)
Sodium: 139 mmol/L (ref 134–144)

## 2016-09-17 LAB — AMBIG ABBREV BMP8 DEFAULT

## 2016-09-17 LAB — SPECIMEN STATUS REPORT

## 2016-09-22 NOTE — Telephone Encounter (Signed)
Follow up with patient and explained why he had not heard from [place] yet about Metolazone.  Informed that we were still waiting on lab work he had done.  Pt tells me that he had blood work drawn at [facility]. For some reason, that result is not in our system. Advised patient I would look into this. He reports swelling improved.  Advised pt to continue to monitor and call if reoccurs and we will address need for more metolazone then. Patient verbalized understanding and agreeable to plan.

## 2016-09-23 ENCOUNTER — Other Ambulatory Visit: Payer: 59

## 2016-09-23 DIAGNOSIS — Z79899 Other long term (current) drug therapy: Secondary | ICD-10-CM

## 2016-09-24 LAB — BASIC METABOLIC PANEL
BUN / CREAT RATIO: 21 (ref 10–24)
BUN: 98 mg/dL — AB (ref 8–27)
CO2: 22 mmol/L (ref 18–29)
CREATININE: 4.64 mg/dL — AB (ref 0.76–1.27)
Calcium: 10.3 mg/dL — ABNORMAL HIGH (ref 8.6–10.2)
Chloride: 96 mmol/L (ref 96–106)
GFR calc Af Amer: 14 mL/min/{1.73_m2} — ABNORMAL LOW (ref >59)
GFR calc non Af Amer: 12 mL/min/{1.73_m2} — ABNORMAL LOW (ref >59)
GLUCOSE: 356 mg/dL — AB (ref 65–99)
Potassium: 4.1 mmol/L (ref 3.5–5.2)
Sodium: 137 mmol/L (ref 134–144)

## 2016-11-24 NOTE — Progress Notes (Signed)
Cardiology Office Note   Date:  11/25/2016   ID:  Minor, Iden [birth year], MRN [id number]  PCP:  Rogers Blocker, MD  Cardiologist:  Dr. Tamala Julian EP  Dr. Curt Bears     Chief Complaint  Patient presents with  . Leg Swelling      History of Present Illness: Matthew Wood is a 66 y.o. male who presents for chronic systolic HF eval.   hx of CAD, chronic combined systolic and diastolic heart failure, HTN, HLD, LBBB, multinodular goiter, OSA on CPAP, DM, CVA and CKD stage III-IV. Recently diagnosed to have ejection fraction by echo in the 30-35% range in the setting of ischemic cardiomyopathy and CRT-D implanted 05/2016  Pt seen by Dr Tamala Julian 08/2016 and he was stable.  Later that month seen by Dr. Curt Bears for CRD=T check up..  At that time SOB and wt gain of 10 lbs.  He had been sleeping in a recliner. With his volume overload he was placed on 4 days of metolazone 2.5 mg prior to his dose of lasix.  His Cr remains 4.64.  He has seen Dr. Marval Regal back in April with Cr at 4.  Zaroxolyn was stopped.  Today from Dr. Thyra Breed note it seems pt should be on laix 60 in am and 40 pm but pt on 40 mg BID.  He still sleeps in recliner due to SOB.  He has lower extremity edema, feet especially.  Pt is aware that he will need dialysis in future.  His wife stated they would not be able to do home dialysis. Pt did not think his heart would tolerate renal transplant.   He does not follow his diabetes at home.  Recently his wife was in rehab after hip surgery and pt had fluid overload.  He denies any chest pain.  + constipation and some decrease in appetite. He does wear his CPAP to sleep or nap.     Past Medical History:  Diagnosis Date  . Anemia   . CHF (congestive heart failure) ([place])   . Coronary artery disease    a. LHC 04/27/10 left main patent, 30-40% LAD, mid circumflex 30%, 80% mid RCA accounting for the appearance of an infract/peri-infract ischemia on nuclear testing, LV EF of 30-45%-->  medical therapy  . CVA (cerebrovascular accident) ([place]) 2011   potine; "little balance problems since" (05/25/2016)  . Gout   . History of kidney stones    "passed them" (05/25/2016)  . Hyperlipidemia   . Hypertension   . LBBB (left bundle branch block)   . Multinodular goiter   . Obesity   . OSA on CPAP   . Presence of permanent cardiac pacemaker   . Renal insufficiency   . Type II diabetes mellitus ([place])     Past Surgical History:  Procedure Laterality Date  . COLONOSCOPY WITH PROPOFOL N/A 04/16/2014   Procedure: COLONOSCOPY WITH PROPOFOL;  Surgeon: [name], MD;  [facility];  Service: Endoscopy;  Laterality: N/A;  . EP IMPLANTABLE DEVICE N/A 05/25/2016   Procedure: BiV ICD Insertion CRT-D;  Surgeon: [name], MD;  Location: [facility];  Service: Cardiovascular;  Laterality: N/A;  . INSERT / REPLACE / REMOVE PACEMAKER  05/25/2016   biventricular pacemaker  . LAPAROSCOPIC CHOLECYSTECTOMY       Current Outpatient Prescriptions  Medication Sig Dispense Refill  . allopurinol (ZYLOPRIM) 300 MG tablet Take 300 mg by mouth daily.    Marland Kitchen amLODipine-benazepril (LOTREL) 10-40 MG per  capsule Take 1 capsule by mouth every morning.     . carvedilol (COREG) 25 MG tablet Take 25 mg by mouth 2 (two) times daily with a meal.    . cloNIDine (CATAPRES) 0.2 MG tablet Take 0.5 tablet (0.1 mg total) by mouth each morning. Take 1 tablet (0.2 mg total) by mouth each evening.    . furosemide (LASIX) 40 MG tablet Take 1 tablet (40 mg total) by mouth 2 (two) times daily. 180 tablet 3  . glimepiride (AMARYL) 4 MG tablet Take 4 mg by mouth 2 (two) times daily.    . Insulin Glargine (BASAGLAR KWIKPEN) 100 UNIT/ML SOPN Inject 40 Units into the skin at bedtime.     . insulin lispro (HUMALOG) 100 UNIT/ML injection Inject 20 Units into the skin 2 (two) times daily before a meal.     . isosorbide-hydrALAZINE (BIDIL) 20-37.5 MG per tablet Take 1 tablet by mouth 2 (two) times  daily.    . NON FORMULARY cpap as directed    . potassium chloride (K-DUR,KLOR-CON) 10 MEQ tablet Take 10 mEq by mouth 2 (two) times daily.    . rosuvastatin (CRESTOR) 20 MG tablet Take 20 mg by mouth daily.     No current facility-administered medications for this visit.     Allergies:   Patient has no known allergies.    Social History:  The patient  reports that he quit smoking about 6 years ago. His smoking use included Cigarettes. He has a 13.50 pack-year smoking history. He has never used smokeless tobacco. He reports that he drinks alcohol. He reports that he does not use drugs.   Family History:  The patient's family history includes Breast cancer in his mother; Diabetes type II in his father; Heart attack in his father; Hypertension in his mother and sister; Stroke in his father.    ROS:  General:no colds or fevers, + weight down 9 lbs.   Skin:no rashes or ulcers HEENT:no blurred vision, no congestion CV:see HPI PUL:see HPI GI:no diarrhea + constipation no melena, no indigestion GU:no hematuria, no dysuria MS:no joint pain, no claudication, walks with a cane. Neuro:no syncope, no lightheadedness Endo:+ diabetes, no thyroid disease  Wt Readings from Last 3 Encounters:  11/25/16 (!) 304 lb 12.8 oz (138.3 kg)  09/08/16 (!) 313 lb 12.8 oz (142.3 kg)  08/23/16 (!) 301 lb 6.4 oz (136.7 kg)     PHYSICAL EXAM: VS:  BP 116/70 (BP Location: Right Arm, Patient Position: Sitting, Cuff Size: Large)   Pulse 76   Ht 6' (1.829 m)   Wt (!) 304 lb 12.8 oz (138.3 kg)   SpO2 97%   BMI 41.34 kg/m  , BMI Body mass index is 41.34 kg/m. General:Pleasant affect, NAD Skin:Warm and dry, brisk capillary refill HEENT:normocephalic, sclera clear, mucus membranes moist Neck:supple, no JVD, no bruits  Heart:S1S2 RRR without murmur, gallup, rub or click Lungs:clear without rales, rhonchi, or wheezes WJX:BJYNW, soft, non tender, + BS, do not palpate liver spleen or masses Ext:1-2+ lower ext  edema, 2+ pedal pulses, 2+ radial pulses Neuro:alert and oriented X 3, MAE, follows commands, + facial symmetry    EKG:  EKG is NOT ordered today.   Recent Labs: 02/05/2016: Brain Natriuretic Peptide 274.2 05/21/2016: Hemoglobin 11.0; Platelets 200 09/23/2016: BUN 98; Creatinine, Ser 4.64; Potassium 4.1; Sodium 137    Lipid Panel    Component Value Date/Time   CHOL  02/08/2010 0600    171        ATP III  CLASSIFICATION:  <200     mg/dL   Desirable  200-239  mg/dL   Borderline High  >=240    mg/dL   High          TRIG 248 (H) 02/08/2010 0600   HDL 34 (L) 02/08/2010 0600   CHOLHDL 5.0 02/08/2010 0600   VLDL 50 (H) 02/08/2010 0600   LDLCALC  02/08/2010 0600    87        Total Cholesterol/HDL:CHD Risk Coronary Heart Disease Risk Table                     Men   Women  1/2 Average Risk   3.4   3.3  Average Risk       5.0   4.4  2 X Average Risk   9.6   7.1  3 X Average Risk  23.4   11.0        Use the calculated Patient Ratio above and the CHD Risk Table to determine the patient's CHD Risk.        ATP III CLASSIFICATION (LDL):  <100     mg/dL   Optimal  100-129  mg/dL   Near or Above                    Optimal  130-159  mg/dL   Borderline  160-189  mg/dL   High  >190     mg/dL   Very High       Other studies Reviewed: Additional studies/ records that were reviewed today include: . ECHO Study Conclusions  - Left ventricle: The cavity size was moderately dilated. Wall   thickness was increased in a pattern of mild LVH. Systolic   function was moderately to severely reduced. The estimated   ejection fraction was in the range of 30% to 35%. Diffuse   hypokinesis. Features are consistent with a pseudonormal left   ventricular filling pattern, with concomitant abnormal relaxation   and increased filling pressure (grade 2 diastolic dysfunction). - Aortic valve: There was no stenosis. There was mild   regurgitation. - Aorta: Mildly dilated aortic root and ascending  aorta. Aortic   root dimension: 39 mm (ED). Ascending aortic diameter: 42 mm (S). - Mitral valve: There was mild regurgitation. - Left atrium: The atrium was moderately dilated. - Right ventricle: The cavity size was normal. Systolic function   was normal. - Right atrium: The atrium was mildly dilated. - Pulmonary arteries: No complete TR doppler jet so unable to   estimate PA systolic pressure. - Systemic veins: IVC not fully visualized. - Pericardium, extracardiac: A trivial pericardial effusion was   identified posterior to the heart.  Impressions:  - Moderately dilated LV with mild LV hypertrophy. EF 30-35%,   diffuse hypokinesis. Moderate diastolic dysfunction. Normal RV   size and systolic function. Mild aortic insufficiency and mild   MR.   ASSESSMENT AND PLAN:  1.  Chronic systolic HF - + orthopnea sleeping in recliner, + lower ext edema.  Discussed with Dr. Tamala Julian, increase lasix to 80 mg BID and recheck labs next week.  He is on an ACE at 40 mg, if Cr elevated further will discuss with Dr. Tamala Julian on decreasing.   EF 30-35%.  Follow up with Dr. Tamala Julian in 1 month.   2.   CAD of native heart, cath in 2011 with RCA disease, no chest pain.   3. Essential HTN controlled   4.  CKD stage IV  5. Sleep apnea on CPAP.   6. Anemia of chronic disease, now with edema, has had iron infusions.    Current medicines are reviewed with the patient today.  The patient Has no concerns regarding medicines.  The following changes have been made:  See above Labs/ tests ordered today include:see above  Disposition:   FU:  see above  Signed, Cecilie Kicks, NP  11/25/2016 10:35 AM    [address] [place] [place], [place] [place], [place] Phone: [phone]; Fax: [phone]

## 2016-11-25 ENCOUNTER — Ambulatory Visit (INDEPENDENT_AMBULATORY_CARE_PROVIDER_SITE_OTHER): Payer: 59 | Admitting: Cardiology

## 2016-11-25 ENCOUNTER — Encounter: Payer: Self-pay | Admitting: Cardiology

## 2016-11-25 VITALS — BP 116/70 | HR 76 | Ht 72.0 in | Wt 304.8 lb

## 2016-11-25 DIAGNOSIS — N184 Chronic kidney disease, stage 4 (severe): Secondary | ICD-10-CM

## 2016-11-25 DIAGNOSIS — I255 Ischemic cardiomyopathy: Secondary | ICD-10-CM

## 2016-11-25 DIAGNOSIS — R0602 Shortness of breath: Secondary | ICD-10-CM | POA: Diagnosis not present

## 2016-11-25 DIAGNOSIS — D649 Anemia, unspecified: Secondary | ICD-10-CM | POA: Diagnosis not present

## 2016-11-25 DIAGNOSIS — I10 Essential (primary) hypertension: Secondary | ICD-10-CM | POA: Diagnosis not present

## 2016-11-25 DIAGNOSIS — G4733 Obstructive sleep apnea (adult) (pediatric): Secondary | ICD-10-CM

## 2016-11-25 DIAGNOSIS — I5022 Chronic systolic (congestive) heart failure: Secondary | ICD-10-CM

## 2016-11-25 DIAGNOSIS — Z95810 Presence of automatic (implantable) cardiac defibrillator: Secondary | ICD-10-CM

## 2016-11-25 MED ORDER — FUROSEMIDE 40 MG PO TABS: 40.0000 mg | ORAL_TABLET | Freq: Two times a day (BID) | ORAL | 3 refills | Status: DC

## 2016-11-25 MED ORDER — FUROSEMIDE 40 MG PO TABS: 80.0000 mg | ORAL_TABLET | Freq: Two times a day (BID) | ORAL | 3 refills | Status: DC

## 2016-11-25 NOTE — Patient Instructions (Addendum)
Medication Instructions:   START TAKING LASIX  80 MG ( TWO 40 MG TABLET )  TWICE A DAY   If you need a refill on your cardiac medications before your next appointment, please call your pharmacy.  Labwork: RETURN NEXT WEEK FOR BMET AND CBC   Testing/Procedures: NONE ORDERED  TODAY    Follow-Up: WITH DR Tamala Julian  IN ONE MONTH PER  DR. Tamala Julian    Any Other Special Instructions Will Be Listed Below (If Applicable).  PLEASE MAKE SURE YOU [place] YOUR SELF DAILY AND IF YOU HAVEN'T LOST TWO TO  THREE POUNDS WITHIN A WEEK . [place]

## 2016-12-02 ENCOUNTER — Other Ambulatory Visit: Payer: 59 | Admitting: *Deleted

## 2016-12-02 DIAGNOSIS — D649 Anemia, unspecified: Secondary | ICD-10-CM

## 2016-12-02 DIAGNOSIS — R0602 Shortness of breath: Secondary | ICD-10-CM

## 2016-12-02 LAB — CBC
HEMOGLOBIN: 10.4 g/dL — AB (ref 13.0–17.7)
Hematocrit: 32.7 % — ABNORMAL LOW (ref 37.5–51.0)
MCH: 26.7 pg (ref 26.6–33.0)
MCHC: 31.8 g/dL (ref 31.5–35.7)
MCV: 84 fL (ref 79–97)
Platelets: 209 10*3/uL (ref 150–379)
RBC: 3.89 x10E6/uL — AB (ref 4.14–5.80)
RDW: 17.5 % — ABNORMAL HIGH (ref 12.3–15.4)
WBC: 7.7 10*3/uL (ref 3.4–10.8)

## 2016-12-02 LAB — BASIC METABOLIC PANEL
BUN / CREAT RATIO: 12 (ref 10–24)
BUN: 48 mg/dL — AB (ref 8–27)
CALCIUM: 10.3 mg/dL — AB (ref 8.6–10.2)
CHLORIDE: 104 mmol/L (ref 96–106)
CO2: 20 mmol/L (ref 20–29)
CREATININE: 3.85 mg/dL — AB (ref 0.76–1.27)
GFR calc Af Amer: 18 mL/min/{1.73_m2} — ABNORMAL LOW (ref >59)
GFR calc non Af Amer: 16 mL/min/{1.73_m2} — ABNORMAL LOW (ref >59)
GLUCOSE: 163 mg/dL — AB (ref 65–99)
Potassium: 4.1 mmol/L (ref 3.5–5.2)
Sodium: 139 mmol/L (ref 134–144)

## 2016-12-08 ENCOUNTER — Encounter: Payer: 59 | Admitting: *Deleted

## 2016-12-08 ENCOUNTER — Telehealth: Payer: Self-pay | Admitting: Cardiology

## 2016-12-08 NOTE — Telephone Encounter (Signed)
Spoke with pt and reminded pt of remote transmission that is due today. Pt verbalized understanding.   

## 2016-12-22 NOTE — Progress Notes (Signed)
Cardiology Office Note    Date:  12/23/2016   ID:  Rannie, Craney Nov 07, 1950, MRN [id number]  PCP:  Rogers Blocker, MD  Cardiologist: Sinclair Grooms, MD   Chief Complaint  Patient presents with  . Congestive Heart Failure  . Coronary Artery Disease  . Shortness of Breath    History of Present Illness:  Matthew Wood is a 66 y.o. male hx of CAD, chronic combined systolic and diastolic heart failure, HTN, HLD, LBBB, multinodular goiter, OSA on CPAP, DM, CVA and CKD stage III-IV. Recently diagnosed to have ejection fraction by echo in the 30-35% range in the setting of ischemic cardiomyopathy and CRT-D implanted 05/2016  He is not able to sleep in bed. He sleeps in recliner. This is chronic for the last 6-8 months. He is reluctant to have additional diuretic therapy because over aggressive diuresis previously led to deterioration in kidney function. He is willing to suffer and convenience of orthopnea rather than potentially worsening kidney function thickened lead to dialysis. He has been seen by Dr. Marval Regal and has been given educational information concerning dialysis which he has not read. He denies chest pain. No nitroglycerin use. He affirms compliance with his current medical regimen.  Past Medical History:  Diagnosis Date  . Anemia   . CHF (congestive heart failure) ([place])   . Coronary artery disease    a. LHC 04/27/10 left main patent, 30-40% LAD, mid circumflex 30%, 80% mid RCA accounting for the appearance of an infract/peri-infract ischemia on nuclear testing, LV EF of 30-45%--> medical therapy  . CVA (cerebrovascular accident) ([place]) 2011   potine; "little balance problems since" (05/25/2016)  . Gout   . History of kidney stones    "passed them" (05/25/2016)  . Hyperlipidemia   . Hypertension   . LBBB (left bundle branch block)   . Multinodular goiter   . Obesity   . OSA on CPAP   . Presence of permanent cardiac pacemaker   . Renal insufficiency   . Type  II diabetes mellitus ([place])     Past Surgical History:  Procedure Laterality Date  . COLONOSCOPY WITH PROPOFOL N/A 04/16/2014   Procedure: COLONOSCOPY WITH PROPOFOL;  Surgeon: [name], MD;  Location: [facility];  Service: Endoscopy;  Laterality: N/A;  . EP IMPLANTABLE DEVICE N/A 05/25/2016   Procedure: BiV ICD Insertion CRT-D;  Surgeon: [name], MD;  Location: [facility];  Service: Cardiovascular;  Laterality: N/A;  . INSERT / REPLACE / REMOVE PACEMAKER  05/25/2016   biventricular pacemaker  . LAPAROSCOPIC CHOLECYSTECTOMY      Current Medications: Outpatient Medications Prior to Visit  Medication Sig Dispense Refill  . allopurinol (ZYLOPRIM) 300 MG tablet Take 300 mg by mouth daily.    Marland Kitchen amLODipine-benazepril (LOTREL) 10-40 MG per capsule Take 1 capsule by mouth every morning.     . carvedilol (COREG) 25 MG tablet Take 25 mg by mouth 2 (two) times daily with a meal.    . cloNIDine (CATAPRES) 0.2 MG tablet Take 0.5 tablet (0.1 mg total) by mouth each morning. Take 1 tablet (0.2 mg total) by mouth each evening.    . furosemide (LASIX) 40 MG tablet Take 2 tablets (80 mg total) by mouth 2 (two) times daily. 360 tablet 3  . glimepiride (AMARYL) 4 MG tablet Take 4 mg by mouth 2 (two) times daily.    . Insulin Glargine (BASAGLAR KWIKPEN) 100 UNIT/ML SOPN Inject 40 Units into the skin  at bedtime.     . insulin lispro (HUMALOG) 100 UNIT/ML injection Inject 20 Units into the skin 2 (two) times daily before a meal.     . isosorbide-hydrALAZINE (BIDIL) 20-37.5 MG per tablet Take 1 tablet by mouth 2 (two) times daily.    . NON FORMULARY cpap as directed    . potassium chloride (K-DUR,KLOR-CON) 10 MEQ tablet Take 10 mEq by mouth 2 (two) times daily.    . rosuvastatin (CRESTOR) 20 MG tablet Take 20 mg by mouth daily.     No facility-administered medications prior to visit.      Allergies:   Patient has no known allergies.   Social History   Social History  .  Marital status: Married    Spouse name: N/A  . Number of children: 1  . 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 Smoking status: Former Smoker    Packs/day: 0.50    Years: 27.00    Types: Cigarettes    Quit date: 03/25/2010  . Smokeless tobacco: Never Used  . Alcohol use Yes     Comment: 05/25/2016 "was an occasional drinker; maybe 4 drinks/week; stopped after stroke in 2011"  . Drug use: No  . Sexual activity: Not Asked   Other Topics Concern  . None   Social History Narrative  . None     Family History:  The patient's family history includes Breast cancer in his mother; Diabetes type II in his father; Heart attack in his father; Hypertension in his mother and sister; Stroke in his father.   ROS:   Please see the history of present illness.    Dyspnea on exertion, orthopnea, leg swelling, otherwise unremarkable. Appetite has increased. Occasional wheezing. Constipation, anxiety, difficulty with balance.  All other systems reviewed and are negative.   PHYSICAL EXAM:   VS:  BP 124/76 (BP Location: Left Arm)   Pulse 92   Ht 6' (1.829 m)   Wt 285 lb 6.4 oz (129.5 kg)   BMI 38.71 kg/m    GEN: Well nourished, well developed, in no acute distress . Morbidly obese. HEENT: normal  Neck: no JVD, carotid bruits, or masses Cardiac: RRR; no murmurs, rubs, or gallops,no edema  Respiratory:  clear to auscultation bilaterally, normal work of breathing GI: soft, nontender, nondistended, + BS MS: no deformity or atrophy  Skin: warm and dry, no rash Neuro:  Alert and Oriented x 3, Strength and sensation are intact Psych: euthymic mood, full affect  Wt Readings from Last 3 Encounters:  12/23/16 285 lb 6.4 oz (129.5 kg)  11/25/16 (!) 304 lb 12.8 oz (138.3 kg)  09/08/16 (!) 313 lb 12.8 oz (142.3 kg)      Studies/Labs Reviewed:   EKG:  EKG  Not repeated  Recent Labs: 02/05/2016: Brain Natriuretic Peptide  274.2 12/02/2016: BUN 48; Creatinine, Ser 3.85; Hemoglobin 10.4; Platelets 209; Potassium 4.1; Sodium 139   Lipid Panel    Component Value Date/Time   CHOL  02/08/2010 0600    171        ATP III CLASSIFICATION:  <200     mg/dL   Desirable  200-239  mg/dL   Borderline High  >=240    mg/dL   High          TRIG 248 (H) 02/08/2010 0600   HDL 34 (L) 02/08/2010 0600   CHOLHDL 5.0 02/08/2010 0600   VLDL 50 (H)  02/08/2010 0600   LDLCALC  02/08/2010 0600    87        Total Cholesterol/HDL:CHD Risk Coronary Heart Disease Risk Table                     Men   Women  1/2 Average Risk   3.4   3.3  Average Risk       5.0   4.4  2 X Average Risk   9.6   7.1  3 X Average Risk  23.4   11.0        Use the calculated Patient Ratio above and the CHD Risk Table to determine the patient's CHD Risk.        ATP III CLASSIFICATION (LDL):  <100     mg/dL   Optimal  100-129  mg/dL   Near or Above                    Optimal  130-159  mg/dL   Borderline  160-189  mg/dL   High  >190     mg/dL   Very High    Additional studies/ records that were reviewed today include:  No new functional data    ASSESSMENT:    1. Chronic systolic heart failure ([place])   2. Essential hypertension   3. Coronary artery disease involving native coronary artery of native heart without angina pectoris   4. Left pontine CVA ([place])   5. Carotid aneurysm, left ([place])   6. Sleep apnea, unspecified type   7. CKD (chronic kidney disease), stage IV ([facility])      PLAN:  In order of problems listed above:  1. Mild to moderate volume overload that could improve with more aggressive diuresis. The patient is stable and the current condition but has quality-of-life alteration that he is willing to except because he is afraid to have additional diuretic added. 2. Blood pressures well controlled. Target is less than 130/90 mmHg 3. No angina or other ischemic complaints. 4. Not address 5. Not addressed 6. Encouraged and he  affirms compliance with C Pap 7. Followed by Dr. Marval Regal. Continue nephrology follow-up. He will manage the patient's diuretic regimen.  Plan six-month clinical follow-up. He should notify us of dyspnea/orthopnea worsens. Notify us of chest pain. No change in medical regimen.  Medication Adjustments/Labs and Tests Ordered: Current medicines are reviewed at length with the patient today.  Concerns regarding medicines are outlined above.  Medication changes, Labs and Tests ordered today are listed in the Patient Instructions below. Patient Instructions  Medication Instructions:  None  Labwork: None  Testing/Procedures: None  Follow-Up: Your physician wants you to follow-up in: 6 months with Dr. Tamala Julian.  You will receive a reminder letter in the mail two months in advance. If you don't receive a letter, please call our office to schedule the follow-up appointment.   Any Other Special Instructions Will Be Listed Below (If Applicable).     If you need a refill on your cardiac medications before your next appointment, please call your pharmacy.      Signed, Sinclair Grooms, MD  12/23/2016 10:17 AM    [facility] [address] [place], [place], [place]  [address] Phone: [phone]; Fax: [phone]

## 2016-12-23 ENCOUNTER — Ambulatory Visit (INDEPENDENT_AMBULATORY_CARE_PROVIDER_SITE_OTHER): Payer: 59 | Admitting: Interventional Cardiology

## 2016-12-23 ENCOUNTER — Encounter: Payer: Self-pay | Admitting: Interventional Cardiology

## 2016-12-23 VITALS — BP 124/76 | HR 92 | Ht 72.0 in | Wt 285.4 lb

## 2016-12-23 DIAGNOSIS — I10 Essential (primary) hypertension: Secondary | ICD-10-CM

## 2016-12-23 DIAGNOSIS — I5022 Chronic systolic (congestive) heart failure: Secondary | ICD-10-CM | POA: Diagnosis not present

## 2016-12-23 DIAGNOSIS — I6350 Cerebral infarction due to unspecified occlusion or stenosis of unspecified cerebral artery: Secondary | ICD-10-CM

## 2016-12-23 DIAGNOSIS — I720 Aneurysm of carotid artery: Secondary | ICD-10-CM | POA: Diagnosis not present

## 2016-12-23 DIAGNOSIS — I2510 Atherosclerotic heart disease of native coronary artery without angina pectoris: Secondary | ICD-10-CM | POA: Diagnosis not present

## 2016-12-23 DIAGNOSIS — N184 Chronic kidney disease, stage 4 (severe): Secondary | ICD-10-CM | POA: Diagnosis not present

## 2016-12-23 DIAGNOSIS — I639 Cerebral infarction, unspecified: Secondary | ICD-10-CM

## 2016-12-23 DIAGNOSIS — G4730 Sleep apnea, unspecified: Secondary | ICD-10-CM

## 2016-12-23 NOTE — Patient Instructions (Signed)
Medication Instructions:  None  Labwork: None  Testing/Procedures: None  Follow-Up: Your physician wants you to follow-up in: 6 months with Dr. Smith.  You will receive a reminder letter in the mail two months in advance. If you don't receive a letter, please call our office to schedule the follow-up appointment.   Any Other Special Instructions Will Be Listed Below (If Applicable).     If you need a refill on your cardiac medications before your next appointment, please call your pharmacy.   

## 2017-01-15 ENCOUNTER — Other Ambulatory Visit: Payer: Self-pay | Admitting: Physician Assistant

## 2017-03-28 ENCOUNTER — Other Ambulatory Visit (HOSPITAL_COMMUNITY): Payer: Self-pay | Admitting: *Deleted

## 2017-03-29 ENCOUNTER — Ambulatory Visit (HOSPITAL_COMMUNITY)
Admission: RE | Admit: 2017-03-29 | Discharge: 2017-03-29 | Disposition: A | Discharge status: Home / Self Care | Payer: 59 | Source: Ambulatory Visit | Attending: Nephrology | Admitting: Nephrology

## 2017-03-29 DIAGNOSIS — N189 Chronic kidney disease, unspecified: Secondary | ICD-10-CM | POA: Insufficient documentation

## 2017-03-29 DIAGNOSIS — D631 Anemia in chronic kidney disease: Secondary | ICD-10-CM | POA: Diagnosis present

## 2017-03-29 MED ORDER — FERUMOXYTOL INJECTION 510 MG/17 ML
510.0000 mg | INTRAVENOUS | Status: DC
  Administered 2017-03-29: 510 mg via INTRAVENOUS
  Filled 2017-03-29: qty 17

## 2017-04-05 ENCOUNTER — Encounter (HOSPITAL_COMMUNITY)
Admission: RE | Admit: 2017-04-05 | Discharge: 2017-04-05 | Disposition: A | Discharge status: Home / Self Care | Payer: 59 | Source: Ambulatory Visit | Attending: Nephrology | Admitting: Nephrology

## 2017-04-05 DIAGNOSIS — N189 Chronic kidney disease, unspecified: Secondary | ICD-10-CM | POA: Diagnosis not present

## 2017-04-05 DIAGNOSIS — D631 Anemia in chronic kidney disease: Secondary | ICD-10-CM | POA: Insufficient documentation

## 2017-04-05 MED ORDER — SODIUM CHLORIDE 0.9 % IV SOLN
510.0000 mg | INTRAVENOUS | Status: DC
  Administered 2017-04-05: 510 mg via INTRAVENOUS
  Filled 2017-04-05: qty 17

## 2017-07-16 ENCOUNTER — Other Ambulatory Visit: Payer: Self-pay | Admitting: Interventional Cardiology

## 2017-08-09 ENCOUNTER — Telehealth: Payer: Self-pay | Admitting: Interventional Cardiology

## 2017-08-09 NOTE — Telephone Encounter (Signed)
New Message   Pt c/o swelling: STAT is pt has developed SOB within 24 hours  1) How much weight have you gained and in what time span? unknown  2) If swelling, where is the swelling located? Legs, feet and hands  3) Are you currently taking a fluid pill? Yes,   4) Are you currently SOB? yes  5) Do you have a log of your daily weights (if so, list)? no  6) Have you gained 3 pounds in a day or 5 pounds in a week? unknown  7) Have you traveled recently? No

## 2017-08-09 NOTE — Telephone Encounter (Signed)
Spoke with Pt's wife regarding increased SOB and 17lb weight gain in the last 6-12 months. Pt should have had an Defib visit with Dr Curt Bears Jan 2019 and and OV with Dr Tamala Julian in December. His wife stated his nephrologist ordered him Metolazone to take for fluid overload. She gave him one yesterday (2/25) and he felt better. He has a f/up appt with his nephrologist on 2/28 and I advised her to speak with them on dosing for Metolazone since they are the ordering provider.   I told Ms Gaughan someone would be calling to schedule a Defib Appt soon. She stated she would f/up with Dr Tamala Julian in the near future as well.

## 2017-08-09 NOTE — Telephone Encounter (Signed)
Spoke with pt and wife. Pt experiencing increasing SOB, wt gain & BLEE pitting. Appt made to see Cecilie Kicks, NP this Thursday for overdue cardiology f/u and to address above issues/concerns. Advised to stop at check out and schedule f/u w/ [place] when they come Thursday. Wife verbalized understanding and agreeable to plan.

## 2017-08-10 ENCOUNTER — Encounter: Payer: Self-pay | Admitting: Cardiology

## 2017-08-11 ENCOUNTER — Ambulatory Visit: Payer: 59 | Admitting: Cardiology

## 2017-08-11 ENCOUNTER — Encounter: Payer: Self-pay | Admitting: Cardiology

## 2017-08-11 VITALS — BP 134/76 | HR 84 | Ht 72.0 in | Wt 311.1 lb

## 2017-08-11 DIAGNOSIS — R0602 Shortness of breath: Secondary | ICD-10-CM

## 2017-08-11 DIAGNOSIS — I2510 Atherosclerotic heart disease of native coronary artery without angina pectoris: Secondary | ICD-10-CM

## 2017-08-11 DIAGNOSIS — N184 Chronic kidney disease, stage 4 (severe): Secondary | ICD-10-CM | POA: Diagnosis not present

## 2017-08-11 DIAGNOSIS — I5022 Chronic systolic (congestive) heart failure: Secondary | ICD-10-CM

## 2017-08-11 DIAGNOSIS — I6350 Cerebral infarction due to unspecified occlusion or stenosis of unspecified cerebral artery: Secondary | ICD-10-CM

## 2017-08-11 DIAGNOSIS — I10 Essential (primary) hypertension: Secondary | ICD-10-CM | POA: Diagnosis not present

## 2017-08-11 DIAGNOSIS — I639 Cerebral infarction, unspecified: Secondary | ICD-10-CM

## 2017-08-11 DIAGNOSIS — G4730 Sleep apnea, unspecified: Secondary | ICD-10-CM | POA: Diagnosis not present

## 2017-08-11 MED ORDER — METOLAZONE 2.5 MG PO TABS: 2.5000 mg | ORAL_TABLET | ORAL | 3 refills | Status: DC

## 2017-08-11 NOTE — Progress Notes (Signed)
Cardiology Office Note   Date:  08/11/2017   ID:  Matthew Wood 06-Aug-1950, MRN [id number]  PCP:  Rogers Blocker, MD  Cardiologist:  Dr. Tamala Julian     Chief Complaint  Patient presents with  . Shortness of Breath    edema      History of Present Illness: Matthew Wood is a 67 y.o. male who presents for edema and increased SOB.     He has a hx of CAD, chronic combined systolic and diastolic heart failure, HTN, HLD, LBBB, multinodular goiter, OSA on CPAP, DM, CVA and CKD stage III-IV. Recently diagnosed to have ejection fraction by echo in the 30-35% range in the setting of ischemic cardiomyopathy and CRT-D implanted 05/2016  He sleeps in recliner. This is chronic for the last 6-8 months. He is reluctant to have additional diuretic therapy because over aggressive diuresis previously led to deterioration in kidney function. He is willing to suffer and convenience of orthopnea rather than potentially worsening kidney function thickened lead to dialysis. He has been seen by Dr. Marval Regal and has been given educational information concerning dialysis which he has not read.   Pt has had increased swelling and SOB for a week or so.  He has metolazone from his PCP and took one 2.5 mg tab and wt decreased from 317 to 311.  He is wt is up today 31 lbs.  At times he has problems catching his breath.  + lower ext edema.  No chest pain.  He does wear his CPAP every night.  He still eats sausage and bacon.  Salty foods.  We discussed cutting way back.   Past Medical History:  Diagnosis Date  . Anemia   . CHF (congestive heart failure) ([place])   . Coronary artery disease    a. LHC 04/27/10 left main patent, 30-40% LAD, mid circumflex 30%, 80% mid RCA accounting for the appearance of an infract/peri-infract ischemia on nuclear testing, LV EF of 30-45%--> medical therapy  . CVA (cerebrovascular accident) ([place]) 2011   potine; "little balance problems since" (05/25/2016)  . Gout   . History of  kidney stones    "passed them" (05/25/2016)  . Hyperlipidemia   . Hypertension   . LBBB (left bundle branch block)   . Multinodular goiter   . Obesity   . OSA on CPAP   . Presence of permanent cardiac pacemaker   . Renal insufficiency   . Type II diabetes mellitus ([place])     Past Surgical History:  Procedure Laterality Date  . COLONOSCOPY WITH PROPOFOL N/A 04/16/2014   Procedure: COLONOSCOPY WITH PROPOFOL;  Surgeon: [name], MD;  [facility]: [facility];  Service: Endoscopy;  Laterality: N/A;  . EP IMPLANTABLE DEVICE N/A 05/25/2016   Procedure: BiV ICD Insertion CRT-D;  Surgeon: [name], MD;  Location: [facility];  Service: Cardiovascular;  Laterality: N/A;  . INSERT / REPLACE / REMOVE PACEMAKER  05/25/2016   biventricular pacemaker  . LAPAROSCOPIC CHOLECYSTECTOMY       Current Outpatient Medications  Medication Sig Dispense Refill  . allopurinol (ZYLOPRIM) 300 MG tablet Take 300 mg by mouth daily.    Marland Kitchen amLODipine-benazepril (LOTREL) 10-40 MG per capsule Take 1 capsule by mouth every morning.     . carvedilol (COREG) 25 MG tablet Take 25 mg by mouth 2 (two) times daily with a meal.    . cloNIDine (CATAPRES) 0.2 MG tablet Take 0.5 tablet (0.1 mg total) by mouth each  morning. Take 1 tablet (0.2 mg total) by mouth each evening.    . furosemide (LASIX) 40 MG tablet Take 2 tablets (80 mg total) by mouth 2 (two) times daily. 360 tablet 3  . glimepiride (AMARYL) 4 MG tablet Take 4 mg by mouth 2 (two) times daily.    . Insulin Glargine (BASAGLAR KWIKPEN) 100 UNIT/ML SOPN Inject 40 Units into the skin at bedtime.     . insulin lispro (HUMALOG) 100 UNIT/ML injection Inject 20 Units into the skin 2 (two) times daily before a meal.     . isosorbide-hydrALAZINE (BIDIL) 20-37.5 MG per tablet Take 1 tablet by mouth 2 (two) times daily.    Marland Kitchen KLOR-CON M10 10 MEQ tablet TAKE 1 TABLET (10 MEQ TOTAL) BY MOUTH 2 (TWO) TIMES DAILY. 180 tablet 1  . NON FORMULARY cpap as  directed    . rosuvastatin (CRESTOR) 20 MG tablet Take 20 mg by mouth daily.    . metolazone (ZAROXOLYN) 2.5 MG tablet Take 1 tablet (2.5 mg total) by mouth every other day. 30 minutes prior to your Furosemide 45 tablet 3   No current facility-administered medications for this visit.     Allergies:   Patient has no known allergies.    Social History:  The patient  reports that he quit smoking about 7 years ago. His smoking use included cigarettes. He has a 13.50 pack-year smoking history. he has never used smokeless tobacco. He reports that he drinks alcohol. He reports that he does not use drugs.   Family History:  The patient's family history includes Breast cancer in his mother; Diabetes type II in his father; Heart attack in his father; Hypertension in his mother and sister; Stroke in his father.    ROS:  General:no colds or fevers, + weight increase 31 lbs Skin:no rashes or ulcers HEENT:no blurred vision, no congestion CV:see HPI PUL:see HPI GI:no diarrhea constipation or melena, no indigestion GU:no hematuria, no dysuria MS:no joint pain, no claudication Neuro:no syncope, no lightheadedness Endo:+ diabetes that he tells me has been up, no thyroid disease  Wt Readings from Last 3 Encounters:  08/11/17 (!) 311 lb 1.9 oz (141.1 kg)  04/05/17 280 lb (127 kg)  03/29/17 280 lb (127 kg)     PHYSICAL EXAM: VS:  BP 134/76   Pulse 84   Ht 6' (1.829 m)   Wt (!) 311 lb 1.9 oz (141.1 kg)   BMI 42.20 kg/m  , BMI Body mass index is 42.2 kg/m. General:Pleasant affect, NAD Skin:Warm and dry, brisk capillary refill HEENT:normocephalic, sclera clear, mucus membranes moist Neck:supple, + JVD, no bruits  Heart:S1S2 RRR without murmur, gallup, rub or click Lungs:clear without rales, rhonchi, or wheezes UYQ:IHKVQ, soft, non tender, + BS, do not palpate liver spleen or masses Ext:2-3+ lower ext edema to knees, 2+ pedal pulses, 2+ radial pulses Neuro:alert and oriented X 3 MAE, follows  commands, + facial symmetry    EKG:  EKG is ordered today. The ekg ordered today demonstrates AV pacing   Recent Labs: 12/02/2016: BUN 48; Creatinine, Ser 3.85; Hemoglobin 10.4; Platelets 209; Potassium 4.1; Sodium 139    Lipid Panel    Component Value Date/Time   CHOL  02/08/2010 0600    171        ATP III CLASSIFICATION:  <200     mg/dL   Desirable  200-239  mg/dL   Borderline High  >=240    mg/dL   High  TRIG 248 (H) 02/08/2010 0600   HDL 34 (L) 02/08/2010 0600   CHOLHDL 5.0 02/08/2010 0600   VLDL 50 (H) 02/08/2010 0600   LDLCALC  02/08/2010 0600    87        Total Cholesterol/HDL:CHD Risk Coronary Heart Disease Risk Table                     Men   Women  1/2 Average Risk   3.4   3.3  Average Risk       5.0   4.4  2 X Average Risk   9.6   7.1  3 X Average Risk  23.4   11.0        Use the calculated Patient Ratio above and the CHD Risk Table to determine the patient's CHD Risk.        ATP III CLASSIFICATION (LDL):  <100     mg/dL   Optimal  100-129  mg/dL   Near or Above                    Optimal  130-159  mg/dL   Borderline  160-189  mg/dL   High  >190     mg/dL   Very High       Other studies Reviewed: Additional studies/ records that were reviewed today include:   Echo 02/19/16 Study Conclusions  - Left ventricle: The cavity size was moderately dilated. Wall   thickness was increased in a pattern of mild LVH. Systolic   function was moderately to severely reduced. The estimated   ejection fraction was in the range of 30% to 35%. Diffuse   hypokinesis. Features are consistent with a pseudonormal left   ventricular filling pattern, with concomitant abnormal relaxation   and increased filling pressure (grade 2 diastolic dysfunction). - Aortic valve: There was no stenosis. There was mild   regurgitation. - Aorta: Mildly dilated aortic root and ascending aorta. Aortic   root dimension: 39 mm (ED). Ascending aortic diameter: 42 mm (S). - Mitral  valve: There was mild regurgitation. - Left atrium: The atrium was moderately dilated. - Right ventricle: The cavity size was normal. Systolic function   was normal. - Right atrium: The atrium was mildly dilated. - Pulmonary arteries: No complete TR doppler jet so unable to   estimate PA systolic pressure. - Systemic veins: IVC not fully visualized. - Pericardium, extracardiac: A trivial pericardial effusion was   identified posterior to the heart.  Impressions:  - Moderately dilated LV with mild LV hypertrophy. EF 30-35%,   diffuse hypokinesis. Moderate diastolic dysfunction. Normal RV   size and systolic function. Mild aortic insufficiency and mild   MR.   ASSESSMENT AND PLAN:  1.  SOB and edema CHF with decrease in EF on last echo.  Will add metolazone 2.5 mg every other day 30 min prior to his 80 of lasix BID.  Will check BMP today and BNP. He is to see PCP next week and I will see the next week.  If symptoms do not improve will check CXR.  And echo.  2.   BIVICD, will need follow up with Dr. Curt Bears.  3.  Chronic systolic HF  See #1.   4.  HTN controlled  5.   CAD of native vessels no pain  6.   Sleep apnea wears CPAP every night.  7.   CKD stage IV   8.    hx Left pontine CVA  Current medicines are reviewed with the patient today.  The patient Has no concerns regarding medicines.  The following changes have been made:  See above Labs/ tests ordered today include:see above  Disposition:   FU:  see above  Signed, Cecilie Kicks, NP  08/11/2017 5:34 PM    [facility], [place], [place], [place] Phone: [phone]; Fax: [phone]

## 2017-08-11 NOTE — Patient Instructions (Addendum)
Medication Instructions:  1) On Saturday, START Metolazone 2.5mg  once daily, every other day  Labwork: BMET and Pro BNP today  Testing/Procedures: None  Follow-Up: Your physician recommends that you schedule a follow-up appointment in: 2 weeks with Cecilie Kicks, NP.  Scheduled 3/13 at 11AM.  Your physician recommends that you schedule a follow-up appointment ASAP with Dr. Curt Bears.  Your physician recommends that you schedule a follow-up appointment in: June with Dr. Tamala Julian.        Any Other Special Instructions Will Be Listed Below (If Applicable).     If you need a refill on your cardiac medications before your next appointment, please call your pharmacy.

## 2017-08-12 ENCOUNTER — Other Ambulatory Visit: Payer: Self-pay | Admitting: *Deleted

## 2017-08-12 DIAGNOSIS — I5022 Chronic systolic (congestive) heart failure: Secondary | ICD-10-CM

## 2017-08-12 LAB — BASIC METABOLIC PANEL
BUN/Creatinine Ratio: 14 (ref 10–24)
BUN: 58 mg/dL — AB (ref 8–27)
CHLORIDE: 102 mmol/L (ref 96–106)
CO2: 21 mmol/L (ref 20–29)
CREATININE: 4.17 mg/dL — AB (ref 0.76–1.27)
Calcium: 10.2 mg/dL (ref 8.6–10.2)
GFR calc Af Amer: 16 mL/min/{1.73_m2} — ABNORMAL LOW (ref >59)
GFR calc non Af Amer: 14 mL/min/{1.73_m2} — ABNORMAL LOW (ref >59)
GLUCOSE: 135 mg/dL — AB (ref 65–99)
Potassium: 4 mmol/L (ref 3.5–5.2)
Sodium: 142 mmol/L (ref 134–144)

## 2017-08-12 LAB — PRO B NATRIURETIC PEPTIDE: NT-PRO BNP: 740 pg/mL — AB (ref 0–210)

## 2017-08-18 ENCOUNTER — Other Ambulatory Visit: Payer: 59 | Admitting: *Deleted

## 2017-08-18 DIAGNOSIS — I5022 Chronic systolic (congestive) heart failure: Secondary | ICD-10-CM

## 2017-08-19 LAB — BASIC METABOLIC PANEL
BUN/Creatinine Ratio: 18 (ref 10–24)
BUN: 92 mg/dL (ref 8–27)
CALCIUM: 10 mg/dL (ref 8.6–10.2)
CHLORIDE: 97 mmol/L (ref 96–106)
CO2: 23 mmol/L (ref 20–29)
Creatinine, Ser: 5.11 mg/dL — ABNORMAL HIGH (ref 0.76–1.27)
GFR calc Af Amer: 13 mL/min/{1.73_m2} — ABNORMAL LOW (ref >59)
GFR, EST NON AFRICAN AMERICAN: 11 mL/min/{1.73_m2} — AB (ref >59)
Glucose: 277 mg/dL — ABNORMAL HIGH (ref 65–99)
POTASSIUM: 4 mmol/L (ref 3.5–5.2)
Sodium: 139 mmol/L (ref 134–144)

## 2017-08-23 NOTE — Progress Notes (Signed)
Cardiology Office Note   Date:  08/24/2017   ID:  Magnum, Lunde 1950-11-25, MRN [id number]  PCP:  Rogers Blocker, MD  Cardiologist:  Dr. Tamala Julian    Chief Complaint  Patient presents with  . Congestive Heart Failure      History of Present Illness: Matthew Wood is a 67 y.o. male who presents for CHF.     He has a hx of CAD, chronic combined systolic and diastolic heart failure, HTN, HLD, LBBB, multinodular goiter, OSA on CPAP, DM, CVA and CKD stage III-IV. Diagnosed to have ejection fraction by echo in the 30-35% range in the setting of ischemic cardiomyopathy and CRT-D implanted 05/2016  He sleeps in recliner. This is chronic for the last 6-8 months. He is reluctant to have additional diuretic therapy because over aggressive diuresis previously led to deterioration in kidney function. He is willing to suffer and convenience of orthopnea rather than potentially worsening kidney function thickened lead to dialysis. He has been seen byDr. Rachael Darby has been given educational information concerning dialysis which he has not read.   Pt on last increased swelling and SOB for a week or so.  He has metolazone from his PCP and took one 2.5 mg tab and wt decreased from 317 to 311.  He is wt is up today 31 lbs.  At times he has problems catching his breath.  + lower ext edema.  No chest pain.  He does wear his CPAP every night.  He was eating sausage and bacon.  Salty foods.  We discussed cutting way back  I added metaloazone 2.5 every other day, Cr elevated and he saw renal. Dr. Marval Regal.  Dr. Marval Regal recommended to continue clonidine 0.1 mg every AM and 0.2 mg every pm , he does receive IV iron.  Today he has been watching salt and decreased fluids to 1-1.5 L --he wants to lose wt to  270 lbs.     His wt is down 10 lbs and he stopped the metolazone this week.  His Cr is up to 5 and I will recheck today off the metolazone.  His wife is not with him today- she started a part  time job at social services.       Past Medical History:  Diagnosis Date  . Anemia   . CHF (congestive heart failure) ([place])   . Coronary artery disease    a. LHC 04/27/10 left main patent, 30-40% LAD, mid circumflex 30%, 80% mid RCA accounting for the appearance of an infract/peri-infract ischemia on nuclear testing, LV EF of 30-45%--> medical therapy  . CVA (cerebrovascular accident) ([place]) 2011   potine; "little balance problems since" (05/25/2016)  . Gout   . History of kidney stones    "passed them" (05/25/2016)  . Hyperlipidemia   . Hypertension   . LBBB (left bundle branch block)   . Multinodular goiter   . Obesity   . OSA on CPAP   . Presence of permanent cardiac pacemaker   . Renal insufficiency   . Type II diabetes mellitus ([place])     Past Surgical History:  Procedure Laterality Date  . COLONOSCOPY WITH PROPOFOL N/A 04/16/2014   Procedure: COLONOSCOPY WITH PROPOFOL;  Surgeon: [name], MD;  Location: [facility];  Service: Endoscopy;  Laterality: N/A;  . EP IMPLANTABLE DEVICE N/A 05/25/2016   Procedure: BiV ICD Insertion CRT-D;  Surgeon: [name], MD;  Location: [place] [facility];  Service: Cardiovascular;  Laterality: N/A;  .  INSERT / REPLACE / REMOVE PACEMAKER  05/25/2016   biventricular pacemaker  . LAPAROSCOPIC CHOLECYSTECTOMY       Current Outpatient Medications  Medication Sig Dispense Refill  . allopurinol (ZYLOPRIM) 300 MG tablet Take 300 mg by mouth daily.    Marland Kitchen amLODipine-benazepril (LOTREL) 10-40 MG per capsule Take 1 capsule by mouth every morning.     . carvedilol (COREG) 25 MG tablet Take 25 mg by mouth 2 (two) times daily with a meal.    . cloNIDine (CATAPRES) 0.2 MG tablet Take 0.5 tablet (0.1 mg total) by mouth each morning. Take 1 tablet (0.2 mg total) by mouth each evening.    Marland Kitchen doxycycline (VIBRAMYCIN) 100 MG capsule Take 100 mg by mouth 2 (two) times daily.  0  . furosemide (LASIX) 40 MG tablet Take 2 tablets (80 mg total) by  mouth 2 (two) times daily. 360 tablet 3  . glimepiride (AMARYL) 4 MG tablet Take 4 mg by mouth 2 (two) times daily.    . Insulin Glargine (BASAGLAR KWIKPEN) 100 UNIT/ML SOPN Inject 40 Units into the skin at bedtime.     . insulin lispro (HUMALOG) 100 UNIT/ML injection Inject 20 Units into the skin 2 (two) times daily before a meal.     . isosorbide-hydrALAZINE (BIDIL) 20-37.5 MG per tablet Take 1 tablet by mouth 2 (two) times daily.    Marland Kitchen KLOR-CON M10 10 MEQ tablet TAKE 1 TABLET (10 MEQ TOTAL) BY MOUTH 2 (TWO) TIMES DAILY. 180 tablet 1  . metolazone (ZAROXOLYN) 2.5 MG tablet Take 1 tablet by mouth once weekly if you gain 3-4 pounds in a day 15 tablet 2  . NON FORMULARY cpap as directed    . rosuvastatin (CRESTOR) 20 MG tablet Take 20 mg by mouth daily.     No current facility-administered medications for this visit.     Allergies:   Patient has no known allergies.    Social History:  The patient  reports that he quit smoking about 7 years ago. His smoking use included cigarettes. He has a 13.50 pack-year smoking history. he has never used smokeless tobacco. He reports that he drinks alcohol. He reports that he does not use drugs.   Family History:  The patient's family history includes Breast cancer in his mother; Diabetes type II in his father; Heart attack in his father; Hypertension in his mother and sister; Stroke in his father.    ROS:  General:no colds or fevers, + weight loss Skin:no rashes or ulcers HEENT:no blurred vision, no congestion CV:see HPI PUL:see HPI GI:no diarrhea constipation or melena, no indigestion GU:no hematuria, no dysuria MS:no joint pain, no claudication, edema improved, just had finger infection drained and is on doxycycline Neuro:no syncope, no lightheadedness Endo:+ diabetes and is running high, no thyroid disease  Wt Readings from Last 3 Encounters:  08/24/17 (!) 301 lb (136.5 kg)  08/11/17 (!) 311 lb 1.9 oz (141.1 kg)  04/05/17 280 lb (127 kg)      PHYSICAL EXAM: VS:  BP (!) 100/58 (BP Location: Left Arm, Patient Position: Sitting, Cuff Size: Large)   Pulse 86   Ht 6' (1.829 m)   Wt (!) 301 lb (136.5 kg)   SpO2 96%   BMI 40.82 kg/m  , BMI Body mass index is 40.82 kg/m. General:Pleasant affect, NAD Skin:Warm and dry, brisk capillary refill HEENT:normocephalic, sclera clear, mucus membranes moist Neck:supple, mild JVD, no bruits  Heart:S1S2 RRR without murmur, gallup, rub or click Lungs:clear without rales, rhonchi, or wheezes  INO:MVEH, obese, non tender, + BS, do not palpate liver spleen or masses Ext:tr  lower ext edema, Lt > Rt, no longer tight 2+ radial pulses Neuro:alert and oriented X 3, MAE, follows commands, + facial symmetry    EKG:  EKG is NOT ordered today.   Recent Labs: 12/02/2016: Hemoglobin 10.4; Platelets 209 08/11/2017: NT-Pro BNP 740 08/18/2017: BUN 92; Creatinine, Ser 5.11; Potassium 4.0; Sodium 139    Lipid Panel    Component Value Date/Time   CHOL  02/08/2010 0600    171        ATP III CLASSIFICATION:  <200     mg/dL   Desirable  200-239  mg/dL   Borderline High  >=240    mg/dL   High          TRIG 248 (H) 02/08/2010 0600   HDL 34 (L) 02/08/2010 0600   CHOLHDL 5.0 02/08/2010 0600   VLDL 50 (H) 02/08/2010 0600   LDLCALC  02/08/2010 0600    87        Total Cholesterol/HDL:CHD Risk Coronary Heart Disease Risk Table                     Men   Women  1/2 Average Risk   3.4   3.3  Average Risk       5.0   4.4  2 X Average Risk   9.6   7.1  3 X Average Risk  23.4   11.0        Use the calculated Patient Ratio above and the CHD Risk Table to determine the patient's CHD Risk.        ATP III CLASSIFICATION (LDL):  <100     mg/dL   Optimal  100-129  mg/dL   Near or Above                    Optimal  130-159  mg/dL   Borderline  160-189  mg/dL   High  >190     mg/dL   Very High       Other studies Reviewed: Additional studies/ records that were reviewed today include: . Echo  02/19/16 Study Conclusions  - Left ventricle: The cavity size was moderately dilated. Wall thickness was increased in a pattern of mild LVH. Systolic function was moderately to severely reduced. The estimated ejection fraction was in the range of 30% to 35%. Diffuse hypokinesis. Features are consistent with a pseudonormal left ventricular filling pattern, with concomitant abnormal relaxation and increased filling pressure (grade 2 diastolic dysfunction). - Aortic valve: There was no stenosis. There was mild regurgitation. - Aorta: Mildly dilated aortic root and ascending aorta. Aortic root dimension: 39 mm (ED). Ascending aortic diameter: 42 mm (S). - Mitral valve: There was mild regurgitation. - Left atrium: The atrium was moderately dilated. - Right ventricle: The cavity size was normal. Systolic function was normal. - Right atrium: The atrium was mildly dilated. - Pulmonary arteries: No complete TR doppler jet so unable to estimate PA systolic pressure. - Systemic veins: IVC not fully visualized. - Pericardium, extracardiac: A trivial pericardial effusion was identified posterior to the heart.  Impressions:  - Moderately dilated LV with mild LV hypertrophy. EF 30-35%, diffuse hypokinesis. Moderate diastolic dysfunction. Normal RV size and systolic function. Mild aortic insufficiency and mild MR.    ASSESSMENT AND PLAN:  1.  Edema with systolic and diastolic CHF now improved with 10 lb wt loss with metolazone 2.5 mg every  other day for over a week added to lasix.  Now back to lasix alone.  Is following diet now.  Decreased salt .if wt increases 3-4 lbs in a day take a metolazone 2.5 mg once that week.  If wt continues to climb call the office.  Keep follow up with Dr. Tamala Julian in June.    2.   HTN controlled today continue bidil, clonidine and coreg.   3.   ICM with EF 30-35% - with Bi V ICd 06/03/16 - to be seen by Dr. Curt Bears in 2 weeks for  eval.  4.  CAD with last cath 2011 with LM patent 30-40% LAD, mLCX 30%, 80% mRCA  No chest pain  5.  Diabetes type II followed by Dr. Buddy Duty and has been running high   6.  CKD 3-4 followed by Cr. Coladonato.  Will check BMP today  7.  OSA sleep apnea not always compliant.      Current medicines are reviewed with the patient today.  The patient Has no concerns regarding medicines.  The following changes have been made:  See above Labs/ tests ordered today include:see above  Disposition:   FU:  see above  Signed, Cecilie Kicks, NP  08/24/2017 12:09 PM    [facility], [place], [place], [place] Phone: [phone]; Fax: [phone]

## 2017-08-24 ENCOUNTER — Encounter: Payer: Self-pay | Admitting: Cardiology

## 2017-08-24 ENCOUNTER — Ambulatory Visit: Payer: 59 | Admitting: Cardiology

## 2017-08-24 VITALS — BP 100/58 | HR 86 | Ht 72.0 in | Wt 301.0 lb

## 2017-08-24 DIAGNOSIS — Z79899 Other long term (current) drug therapy: Secondary | ICD-10-CM | POA: Diagnosis not present

## 2017-08-24 DIAGNOSIS — N184 Chronic kidney disease, stage 4 (severe): Secondary | ICD-10-CM

## 2017-08-24 DIAGNOSIS — I255 Ischemic cardiomyopathy: Secondary | ICD-10-CM | POA: Diagnosis not present

## 2017-08-24 DIAGNOSIS — I2510 Atherosclerotic heart disease of native coronary artery without angina pectoris: Secondary | ICD-10-CM | POA: Diagnosis not present

## 2017-08-24 DIAGNOSIS — I10 Essential (primary) hypertension: Secondary | ICD-10-CM

## 2017-08-24 DIAGNOSIS — G4730 Sleep apnea, unspecified: Secondary | ICD-10-CM

## 2017-08-24 DIAGNOSIS — I5022 Chronic systolic (congestive) heart failure: Secondary | ICD-10-CM | POA: Diagnosis not present

## 2017-08-24 MED ORDER — METOLAZONE 2.5 MG PO TABS: ORAL_TABLET | ORAL | 2 refills | Status: DC

## 2017-08-24 NOTE — Patient Instructions (Signed)
Medication Instructions: Your physician has recommended you make the following change in your medication:  -1) Metolazone - Take 1 tablet by mouth once weekly if you gain 3-4 pounds in a day - Call our office and update of this happens  Labwork: Your physician has recommended that you have lab work today: BMET  Procedures/Testing: None Ordered  Follow-Up: Your physician recommends that your follow-up appointment as scheduled   If you need a refill on your cardiac medications before your next appointment, please call your pharmacy.

## 2017-08-25 ENCOUNTER — Other Ambulatory Visit: Payer: Self-pay | Admitting: *Deleted

## 2017-08-25 ENCOUNTER — Encounter: Payer: Self-pay | Admitting: Podiatry

## 2017-08-25 ENCOUNTER — Ambulatory Visit: Payer: 59 | Admitting: Podiatry

## 2017-08-25 VITALS — BP 108/54 | HR 71 | Resp 16

## 2017-08-25 DIAGNOSIS — E1140 Type 2 diabetes mellitus with diabetic neuropathy, unspecified: Secondary | ICD-10-CM | POA: Diagnosis not present

## 2017-08-25 DIAGNOSIS — I2510 Atherosclerotic heart disease of native coronary artery without angina pectoris: Secondary | ICD-10-CM

## 2017-08-25 DIAGNOSIS — M79609 Pain in unspecified limb: Secondary | ICD-10-CM

## 2017-08-25 DIAGNOSIS — B351 Tinea unguium: Secondary | ICD-10-CM

## 2017-08-25 DIAGNOSIS — E1149 Type 2 diabetes mellitus with other diabetic neurological complication: Secondary | ICD-10-CM | POA: Diagnosis not present

## 2017-08-25 LAB — BASIC METABOLIC PANEL
BUN/Creatinine Ratio: 24 (ref 10–24)
BUN: 130 mg/dL (ref 8–27)
CALCIUM: 9.8 mg/dL (ref 8.6–10.2)
CHLORIDE: 99 mmol/L (ref 96–106)
CO2: 19 mmol/L — ABNORMAL LOW (ref 20–29)
Creatinine, Ser: 5.32 mg/dL — ABNORMAL HIGH (ref 0.76–1.27)
GFR calc Af Amer: 12 mL/min/{1.73_m2} — ABNORMAL LOW (ref >59)
GFR, EST NON AFRICAN AMERICAN: 10 mL/min/{1.73_m2} — AB (ref >59)
Glucose: 184 mg/dL — ABNORMAL HIGH (ref 65–99)
POTASSIUM: 4 mmol/L (ref 3.5–5.2)
Sodium: 140 mmol/L (ref 134–144)

## 2017-08-25 MED ORDER — FUROSEMIDE 40 MG PO TABS: 80.0000 mg | ORAL_TABLET | Freq: Every day | ORAL | 3 refills | Status: DC

## 2017-08-25 NOTE — Progress Notes (Signed)
   Subjective:    Patient ID: Matthew Wood, male    DOB: [birth year], 67 y.o.   MRN: [id number]  HPI    Review of Systems  All other systems reviewed and are negative.      Objective:   Physical Exam        Assessment & Plan:

## 2017-08-25 NOTE — Progress Notes (Signed)
Reviewed with patient. He understands to take lasix 80 mg once a day. Has appointment with Dr. Curt Bears on 3/26. Will have repeat BMET at that time. Copy to kidney doctor.

## 2017-08-29 ENCOUNTER — Encounter (HOSPITAL_COMMUNITY): Payer: 59

## 2017-08-29 ENCOUNTER — Other Ambulatory Visit (HOSPITAL_COMMUNITY): Payer: Self-pay

## 2017-08-29 NOTE — Progress Notes (Signed)
Subjective:   Patient ID: Matthew Wood, male   DOB: 67 y.o.   MRN: [id number]   HPI Patient presents with painful nailbeds 1-5 both feet that are thick yellow brittle and hard for him to cut.  Long-term history of neuropathy and inability to feel his feet secondary to long-term diabetic condition.  Patient does not smoke and is not active currently   Review of Systems  All other systems reviewed and are negative.       Objective:  Physical Exam  Constitutional: He appears well-developed and well-nourished.  Cardiovascular: Intact distal pulses.  Pulmonary/Chest: Effort normal.  Musculoskeletal: Normal range of motion.  Neurological: He is alert.  Skin: Skin is warm.  Nursing note and vitals reviewed.   Vascular status found to be intact with patient noted to have thick yellow brittle nailbeds 1-5 both feet that are discomfort in the corner and has diminishment of both sharp dull and vibratory.  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  Debrided nailbeds 1-5 both feet with no iatrogenic bleeding and will reappoint for routine care

## 2017-08-30 ENCOUNTER — Ambulatory Visit (HOSPITAL_COMMUNITY)
Admission: RE | Admit: 2017-08-30 | Discharge: 2017-08-30 | Disposition: A | Discharge status: Home / Self Care | Payer: 59 | Source: Ambulatory Visit | Attending: Nephrology | Admitting: Nephrology

## 2017-08-30 DIAGNOSIS — N184 Chronic kidney disease, stage 4 (severe): Secondary | ICD-10-CM | POA: Diagnosis not present

## 2017-08-30 DIAGNOSIS — D631 Anemia in chronic kidney disease: Secondary | ICD-10-CM | POA: Insufficient documentation

## 2017-08-30 MED ORDER — SODIUM CHLORIDE 0.9 % IV SOLN
510.0000 mg | INTRAVENOUS | Status: DC
  Administered 2017-08-30: 510 mg via INTRAVENOUS
  Filled 2017-08-30: qty 17

## 2017-09-06 ENCOUNTER — Encounter (HOSPITAL_COMMUNITY): Payer: 59

## 2017-09-06 ENCOUNTER — Encounter: Payer: Self-pay | Admitting: Cardiology

## 2017-09-06 ENCOUNTER — Other Ambulatory Visit: Payer: 59 | Admitting: *Deleted

## 2017-09-06 ENCOUNTER — Ambulatory Visit: Payer: 59 | Admitting: Cardiology

## 2017-09-06 VITALS — BP 126/62 | HR 79 | Ht 72.0 in | Wt 300.0 lb

## 2017-09-06 DIAGNOSIS — I2510 Atherosclerotic heart disease of native coronary artery without angina pectoris: Secondary | ICD-10-CM

## 2017-09-06 DIAGNOSIS — G4733 Obstructive sleep apnea (adult) (pediatric): Secondary | ICD-10-CM | POA: Diagnosis not present

## 2017-09-06 DIAGNOSIS — I255 Ischemic cardiomyopathy: Secondary | ICD-10-CM | POA: Diagnosis not present

## 2017-09-06 DIAGNOSIS — E7849 Other hyperlipidemia: Secondary | ICD-10-CM

## 2017-09-06 DIAGNOSIS — I5022 Chronic systolic (congestive) heart failure: Secondary | ICD-10-CM | POA: Diagnosis not present

## 2017-09-06 DIAGNOSIS — I10 Essential (primary) hypertension: Secondary | ICD-10-CM | POA: Diagnosis not present

## 2017-09-06 LAB — CUP PACEART INCLINIC DEVICE CHECK
Battery Remaining Longevity: 78 mo
Brady Statistic AS VP Percent: 28.84 %
Brady Statistic RA Percent Paced: 69.27 %
Brady Statistic RV Percent Paced: 95.93 %
HIGH POWER IMPEDANCE MEASURED VALUE: 59 Ohm
Implantable Lead Implant Date: 20171212
Implantable Lead Implant Date: 20171212
Implantable Lead Location: 753858
Implantable Lead Location: 753860
Implantable Lead Model: 4298
Implantable Lead Model: 5076
Lead Channel Impedance Value: 132.321
Lead Channel Impedance Value: 132.321
Lead Channel Impedance Value: 247 Ohm
Lead Channel Impedance Value: 285 Ohm
Lead Channel Impedance Value: 285 Ohm
Lead Channel Impedance Value: 342 Ohm
Lead Channel Impedance Value: 418 Ohm
Lead Channel Impedance Value: 418 Ohm
Lead Channel Impedance Value: 456 Ohm
Lead Channel Impedance Value: 456 Ohm
Lead Channel Pacing Threshold Amplitude: 1 V
Lead Channel Pacing Threshold Pulse Width: 0.4 ms
Lead Channel Pacing Threshold Pulse Width: 0.4 ms
Lead Channel Sensing Intrinsic Amplitude: 2.5 mV
Lead Channel Setting Pacing Amplitude: 2 V
Lead Channel Setting Pacing Pulse Width: 0.4 ms
MDC IDC LEAD IMPLANT DT: 20171212
MDC IDC LEAD LOCATION: 753859
MDC IDC MSMT BATTERY VOLTAGE: 2.99 V
MDC IDC MSMT LEADCHNL LV IMPEDANCE VALUE: 132.321
MDC IDC MSMT LEADCHNL LV IMPEDANCE VALUE: 142.5 Ohm
MDC IDC MSMT LEADCHNL LV IMPEDANCE VALUE: 142.5 Ohm
MDC IDC MSMT LEADCHNL LV IMPEDANCE VALUE: 285 Ohm
MDC IDC MSMT LEADCHNL LV IMPEDANCE VALUE: 361 Ohm
MDC IDC MSMT LEADCHNL LV IMPEDANCE VALUE: 418 Ohm
MDC IDC MSMT LEADCHNL LV IMPEDANCE VALUE: 418 Ohm
MDC IDC MSMT LEADCHNL LV PACING THRESHOLD AMPLITUDE: 0.5 V
MDC IDC MSMT LEADCHNL RA PACING THRESHOLD AMPLITUDE: 0.75 V
MDC IDC MSMT LEADCHNL RA PACING THRESHOLD PULSEWIDTH: 0.4 ms
MDC IDC MSMT LEADCHNL RV IMPEDANCE VALUE: 247 Ohm
MDC IDC MSMT LEADCHNL RV SENSING INTR AMPL: 18.875 mV
MDC IDC PG IMPLANT DT: 20171212
MDC IDC SESS DTM: 20190326183947
MDC IDC SET LEADCHNL LV PACING AMPLITUDE: 1.25 V
MDC IDC SET LEADCHNL RV PACING AMPLITUDE: 2.5 V
MDC IDC SET LEADCHNL RV PACING PULSEWIDTH: 0.4 ms
MDC IDC SET LEADCHNL RV SENSING SENSITIVITY: 0.3 mV
MDC IDC STAT BRADY AP VP PERCENT: 69.96 %
MDC IDC STAT BRADY AP VS PERCENT: 0.42 %
MDC IDC STAT BRADY AS VS PERCENT: 0.78 %

## 2017-09-06 NOTE — Patient Instructions (Addendum)
Medication Instructions:  Your physician recommends that you continue on your current medications as directed. Please refer to the Current Medication list given to you today.  Labwork: None ordered  Testing/Procedures: None ordered  Follow-Up: Medication Instructions:  Your physician recommends that you continue on your current medications as directed. Please refer to the Current Medication list given to you today.  *If you need a refill on your cardiac medications before your next appointment, please call your pharmacy*  Labwork: None ordered  Testing/Procedures: None ordered  Follow-Up: Remote monitoring is used to monitor your Pacemaker or ICD from home. This monitoring reduces the number of office visits required to check your device to one time per year. It allows [place] to keep an eye on the functioning of your device to ensure it is working properly. You are scheduled for a device check from home on 12/06/2017. You may send your transmission at any time that day. If you have a wireless device, the transmission will be sent automatically. After your physician reviews your transmission, you will receive a postcard with your next transmission date.  Your physician wants you to follow-up in: 1 year with Dr. Curt Bears.  You will receive a reminder letter in the mail two months in advance. If you don't receive a letter, please call our office to schedule the follow-up appointment.  Thank you for choosing [facility]!!   Trinidad Curet, RN [phone]  Any Other Special Instructions Will Be Listed Below (If Applicable).    Your physician wants you to follow-up in: 1 year with Dr. Curt Bears.  You will receive a reminder letter in the mail two months in advance. If you don't receive a letter, please call our office to schedule the follow-up appointment.  * If you need a refill on your cardiac medications before your next appointment, please call your pharmacy.   *Please note that any  paperwork needing to be filled out by the provider will need to be addressed at the front desk prior to seeing the provider. Please note that any FMLA, disability or other documents regarding health condition is subject to a $25.00 charge that must be received prior to completion of paperwork in the form of a money order or check.  Thank you for choosing [facility]!!   Trinidad Curet, RN [phone]

## 2017-09-06 NOTE — Progress Notes (Signed)
Electrophysiology Office Note   Date:  09/06/2017   ID:  Matthew Wood, Matthew Wood [birth year], MRN [id number]  PCP:  Matthew Blocker, MD  Cardiologist:  Tamala Julian Primary Electrophysiologist:  Shanele Nissan Meredith Leeds, MD    Chief Complaint  Patient presents with  . Defib Check    Ischemic cardiomyopathy/Chronic sytolic HF     History of Present Illness: [name] is a 67 y.o. male who presents today for electrophysiology evaluation.   Hx CAD, chronic combined systolic and diastolic heart failure, HTN, HLD, LBBB, multinodular goiter, OSA on CPAP, DM, CVA and CKD stage III-IV.   LHC 04/27/10 for decreased LV function and recent stroke: left main patent, 30-40% LAD, mid circumflex 30%, 80% mid RCA accounting for the appearance of an infract/peri-infract ischemia on nuclear testing, LV EF of 30-45%--> medical therapy recommended to avoid further contrast use that may impair renal function.  Had Medtronic CRT-D implant 06/03/16.    Today, denies symptoms of palpitations, chest pain, shortness of breath, orthopnea, PND, lower extremity edema, claudication, dizziness, presyncope, syncope, bleeding, or neurologic sequela. The patient is tolerating medications without difficulties.  He is feeling well.  A few weeks ago, he had an episode where he had shortness of breath.  He was put on metolazone which greatly improved his symptoms.   Past Medical History:  Diagnosis Date  . Anemia   . CHF (congestive heart failure) ([place])   . Coronary artery disease    a. LHC 04/27/10 left main patent, 30-40% LAD, mid circumflex 30%, 80% mid RCA accounting for the appearance of an infract/peri-infract ischemia on nuclear testing, LV EF of 30-45%--> medical therapy  . CVA (cerebrovascular accident) ([place]) 2011   potine; "little balance problems since" (05/25/2016)  . Gout   . History of kidney stones    "passed them" (05/25/2016)  . Hyperlipidemia   . Hypertension   . LBBB (left bundle branch block)   .  Multinodular goiter   . Obesity   . OSA on CPAP   . Presence of permanent cardiac pacemaker   . Renal insufficiency   . Type II diabetes mellitus ([place])    Past Surgical History:  Procedure Laterality Date  . COLONOSCOPY WITH PROPOFOL N/A 04/16/2014   Procedure: COLONOSCOPY WITH PROPOFOL;  Surgeon: [name], MD;  Location: [facility];  Service: Endoscopy;  Laterality: N/A;  . EP IMPLANTABLE DEVICE N/A 05/25/2016   Procedure: BiV ICD Insertion CRT-D;  Surgeon: Nesbit Michon Meredith Leeds, MD;  Location: [facility];  Service: Cardiovascular;  Laterality: N/A;  . INSERT / REPLACE / REMOVE PACEMAKER  05/25/2016   biventricular pacemaker  . LAPAROSCOPIC CHOLECYSTECTOMY       Current Outpatient Medications  Medication Sig Dispense Refill  . allopurinol (ZYLOPRIM) 300 MG tablet Take 300 mg by mouth daily.    Marland Kitchen amLODipine-benazepril (LOTREL) 10-40 MG per capsule Take 1 capsule by mouth every morning.     . carvedilol (COREG) 25 MG tablet Take 25 mg by mouth 2 (two) times daily with a meal.    . cloNIDine (CATAPRES) 0.2 MG tablet Take 0.5 tablet (0.1 mg total) by mouth each morning. Take 1 tablet (0.2 mg total) by mouth each evening.    Marland Kitchen doxycycline (VIBRAMYCIN) 100 MG capsule Take 100 mg by mouth 2 (two) times daily.  0  . furosemide (LASIX) 40 MG tablet Take 2 tablets (80 mg total) by mouth daily. 360 tablet 3  . glimepiride (AMARYL) 4 MG tablet Take 4 mg  by mouth 2 (two) times daily.    . Insulin Glargine (BASAGLAR KWIKPEN) 100 UNIT/ML SOPN Inject 40 Units into the skin at bedtime.     . insulin lispro (HUMALOG) 100 UNIT/ML injection Inject 20 Units into the skin 2 (two) times daily before a meal.     . isosorbide-hydrALAZINE (BIDIL) 20-37.5 MG per tablet Take 1 tablet by mouth 2 (two) times daily.    Marland Kitchen KLOR-CON M10 10 MEQ tablet TAKE 1 TABLET (10 MEQ TOTAL) BY MOUTH 2 (TWO) TIMES DAILY. 180 tablet 1  . metolazone (ZAROXOLYN) 2.5 MG tablet Take 1 tablet by mouth once weekly if you  gain 3-4 pounds in a day 15 tablet 2  . NON FORMULARY cpap as directed    . rosuvastatin (CRESTOR) 20 MG tablet Take 20 mg by mouth daily.     No current facility-administered medications for this visit.     Allergies:   Patient has no known allergies.   Social History:  The patient  reports that he quit smoking about 7 years ago. His smoking use included cigarettes. He has a 13.50 pack-year smoking history. He has never used smokeless tobacco. He reports that he drinks alcohol. He reports that he does not use drugs.   Family History:  The patient's family history includes Breast cancer in his mother; Diabetes type II in his father; Heart attack in his father; Hypertension in his mother and sister; Stroke in his father.   ROS:  Please see the history of present illness.   Otherwise, review of systems is positive for SOB.   All other systems are reviewed and negative.   PHYSICAL EXAM: VS:  BP 126/62   Pulse 79   Ht 6' (1.829 m)   Wt 300 lb (136.1 kg)   BMI 40.69 kg/m  , BMI Body mass index is 40.69 kg/m. GEN: Well nourished, well developed, in no acute distress  HEENT: normal  Neck: no JVD, carotid bruits, or masses Cardiac: RRR; no murmurs, rubs, or gallops,no edema  Respiratory:  clear to auscultation bilaterally, normal work of breathing GI: soft, nontender, nondistended, + BS MS: no deformity or atrophy  Skin: warm and dry, device site well healed Neuro:  Strength and sensation are intact Psych: euthymic mood, full affect  EKG:  EKG is not ordered today. Personal review of the ekg ordered 08/11/17 shows AV paced, PVCs  Personal review of the device interrogation today. Results in [place]: 12/02/2016: Hemoglobin 10.4; Platelets 209 08/11/2017: NT-Pro BNP 740 08/24/2017: BUN 130; Creatinine, Ser 5.32; Potassium 4.0; Sodium 140    Lipid Panel     Component Value Date/Time   CHOL  02/08/2010 0600    171        ATP III CLASSIFICATION:  <200     mg/dL    Desirable  200-239  mg/dL   Borderline High  >=240    mg/dL   High          TRIG 248 (H) 02/08/2010 0600   HDL 34 (L) 02/08/2010 0600   CHOLHDL 5.0 02/08/2010 0600   VLDL 50 (H) 02/08/2010 0600   LDLCALC  02/08/2010 0600    87        Total Cholesterol/HDL:CHD Risk Coronary Heart Disease Risk Table                     Men   Women  1/2 Average Risk   3.4   3.3  Average Risk  5.0   4.4  2 X Average Risk   9.6   7.1  3 X Average Risk  23.4   11.0        Use the calculated Patient Ratio above and the CHD Risk Table to determine the patient's CHD Risk.        ATP III CLASSIFICATION (LDL):  <100     mg/dL   Optimal  100-129  mg/dL   Near or Above                    Optimal  130-159  mg/dL   Borderline  160-189  mg/dL   High  >190     mg/dL   Very High     Wt Readings from Last 3 Encounters:  09/06/17 300 lb (136.1 kg)  08/30/17 299 lb (135.6 kg)  08/24/17 (!) 301 lb (136.5 kg)      Other studies Reviewed: Additional studies/ records that were reviewed today include: TTE 02/19/16  Review of the above records today demonstrates:  - Left ventricle: The cavity size was moderately dilated. Wall   thickness was increased in a pattern of mild LVH. Systolic   function was moderately to severely reduced. The estimated   ejection fraction was in the range of 30% to 35%. Diffuse   hypokinesis. Features are consistent with a pseudonormal left   ventricular filling pattern, with concomitant abnormal relaxation   and increased filling pressure (grade 2 diastolic dysfunction). - Aortic valve: There was no stenosis. There was mild   regurgitation. - Aorta: Mildly dilated aortic root and ascending aorta. Aortic   root dimension: 39 mm (ED). Ascending aortic diameter: 42 mm (S). - Mitral valve: There was mild regurgitation. - Left atrium: The atrium was moderately dilated. - Right ventricle: The cavity size was normal. Systolic function   was normal. - Right atrium: The atrium was  mildly dilated. - Pulmonary arteries: No complete TR doppler jet so unable to   estimate PA systolic pressure. - Systemic veins: IVC not fully visualized. - Pericardium, extracardiac: A trivial pericardial effusion was   identified posterior to the heart.   ASSESSMENT AND PLAN:  Chronic combined systolic and diastolic CHF: Showed an EF of 30-35%.  Medtronic CRT-D implanted 06/03/16.  Device functioning appropriately.  No changes.    Coronary artery disease: No current chest pain.  Left heart cath with significant disease.  Treated medically.  No changes.  Essential hypertension:  Well-controlled today.  No changes.  Hyperlipidemia: Continue statin  Sleep apnea:CPAP compliance encouraged  Diabetes mellitus Type II: Follow-up by Dr. Buddy Duty  Chronic kidney disease, stage III-IV:  Followed by Dr. Burman Foster.   Current medicines are reviewed at length with the patient today.   The patient does not have concerns regarding his medicines.  The following changes were made today:  none  Labs/ tests ordered today include:  No orders of the defined types were placed in this encounter.    Disposition:   FU with Akim Watkinson 12 months  Signed, Esgar Barnick Meredith Leeds, MD  09/06/2017 2:56 PM     [facility] [address] [place] [address] [phone] (office) [phone] (fax)

## 2017-09-07 LAB — BASIC METABOLIC PANEL
BUN / CREAT RATIO: 20 (ref 10–24)
BUN: 92 mg/dL — AB (ref 8–27)
CALCIUM: 9.9 mg/dL (ref 8.6–10.2)
CO2: 17 mmol/L — ABNORMAL LOW (ref 20–29)
Chloride: 107 mmol/L — ABNORMAL HIGH (ref 96–106)
Creatinine, Ser: 4.7 mg/dL — ABNORMAL HIGH (ref 0.76–1.27)
GFR calc non Af Amer: 12 mL/min/{1.73_m2} — ABNORMAL LOW (ref >59)
GFR, EST AFRICAN AMERICAN: 14 mL/min/{1.73_m2} — AB (ref >59)
Glucose: 213 mg/dL — ABNORMAL HIGH (ref 65–99)
Potassium: 4.6 mmol/L (ref 3.5–5.2)
Sodium: 142 mmol/L (ref 134–144)

## 2017-09-08 ENCOUNTER — Ambulatory Visit (HOSPITAL_COMMUNITY)
Admission: RE | Admit: 2017-09-08 | Discharge: 2017-09-08 | Disposition: A | Discharge status: Home / Self Care | Payer: 59 | Source: Ambulatory Visit | Attending: Nephrology | Admitting: Nephrology

## 2017-09-08 DIAGNOSIS — I130 Hypertensive heart and chronic kidney disease with heart failure and stage 1 through stage 4 chronic kidney disease, or unspecified chronic kidney disease: Secondary | ICD-10-CM | POA: Insufficient documentation

## 2017-09-08 DIAGNOSIS — I5022 Chronic systolic (congestive) heart failure: Secondary | ICD-10-CM | POA: Insufficient documentation

## 2017-09-08 DIAGNOSIS — E1122 Type 2 diabetes mellitus with diabetic chronic kidney disease: Secondary | ICD-10-CM | POA: Diagnosis not present

## 2017-09-08 DIAGNOSIS — N184 Chronic kidney disease, stage 4 (severe): Secondary | ICD-10-CM | POA: Insufficient documentation

## 2017-09-08 DIAGNOSIS — D631 Anemia in chronic kidney disease: Secondary | ICD-10-CM | POA: Diagnosis present

## 2017-09-08 MED ORDER — SODIUM CHLORIDE 0.9 % IV SOLN
510.0000 mg | INTRAVENOUS | Status: DC
  Administered 2017-09-08: 510 mg via INTRAVENOUS
  Filled 2017-09-08: qty 17

## 2017-10-04 ENCOUNTER — Emergency Department (HOSPITAL_COMMUNITY): Payer: 59

## 2017-10-04 ENCOUNTER — Other Ambulatory Visit: Payer: Self-pay

## 2017-10-04 ENCOUNTER — Encounter (HOSPITAL_COMMUNITY): Payer: Self-pay | Admitting: *Deleted

## 2017-10-04 ENCOUNTER — Inpatient Hospital Stay (HOSPITAL_COMMUNITY)
Admission: EM | Admit: 2017-10-04 | Discharge: 2017-10-07 | DRG: 871 | Disposition: A | Discharge status: Home / Self Care | Payer: 59 | Attending: Family Medicine | Admitting: Family Medicine

## 2017-10-04 DIAGNOSIS — R778 Other specified abnormalities of plasma proteins: Secondary | ICD-10-CM | POA: Diagnosis present

## 2017-10-04 DIAGNOSIS — Z79899 Other long term (current) drug therapy: Secondary | ICD-10-CM

## 2017-10-04 DIAGNOSIS — Z95810 Presence of automatic (implantable) cardiac defibrillator: Secondary | ICD-10-CM

## 2017-10-04 DIAGNOSIS — R319 Hematuria, unspecified: Secondary | ICD-10-CM | POA: Diagnosis not present

## 2017-10-04 DIAGNOSIS — N184 Chronic kidney disease, stage 4 (severe): Secondary | ICD-10-CM | POA: Diagnosis present

## 2017-10-04 DIAGNOSIS — I447 Left bundle-branch block, unspecified: Secondary | ICD-10-CM | POA: Diagnosis present

## 2017-10-04 DIAGNOSIS — I5022 Chronic systolic (congestive) heart failure: Secondary | ICD-10-CM | POA: Diagnosis present

## 2017-10-04 DIAGNOSIS — J9601 Acute respiratory failure with hypoxia: Secondary | ICD-10-CM | POA: Diagnosis present

## 2017-10-04 DIAGNOSIS — Z794 Long term (current) use of insulin: Secondary | ICD-10-CM

## 2017-10-04 DIAGNOSIS — J189 Pneumonia, unspecified organism: Secondary | ICD-10-CM

## 2017-10-04 DIAGNOSIS — J181 Lobar pneumonia, unspecified organism: Secondary | ICD-10-CM | POA: Diagnosis present

## 2017-10-04 DIAGNOSIS — E669 Obesity, unspecified: Secondary | ICD-10-CM | POA: Diagnosis present

## 2017-10-04 DIAGNOSIS — E785 Hyperlipidemia, unspecified: Secondary | ICD-10-CM | POA: Diagnosis present

## 2017-10-04 DIAGNOSIS — R0902 Hypoxemia: Secondary | ICD-10-CM

## 2017-10-04 DIAGNOSIS — I2510 Atherosclerotic heart disease of native coronary artery without angina pectoris: Secondary | ICD-10-CM | POA: Diagnosis present

## 2017-10-04 DIAGNOSIS — I130 Hypertensive heart and chronic kidney disease with heart failure and stage 1 through stage 4 chronic kidney disease, or unspecified chronic kidney disease: Secondary | ICD-10-CM | POA: Diagnosis present

## 2017-10-04 DIAGNOSIS — Z8673 Personal history of transient ischemic attack (TIA), and cerebral infarction without residual deficits: Secondary | ICD-10-CM | POA: Diagnosis not present

## 2017-10-04 DIAGNOSIS — A419 Sepsis, unspecified organism: Principal | ICD-10-CM | POA: Diagnosis present

## 2017-10-04 DIAGNOSIS — E1122 Type 2 diabetes mellitus with diabetic chronic kidney disease: Secondary | ICD-10-CM | POA: Diagnosis present

## 2017-10-04 DIAGNOSIS — Z6841 Body Mass Index (BMI) 40.0 and over, adult: Secondary | ICD-10-CM

## 2017-10-04 DIAGNOSIS — G4733 Obstructive sleep apnea (adult) (pediatric): Secondary | ICD-10-CM | POA: Diagnosis present

## 2017-10-04 DIAGNOSIS — Z87891 Personal history of nicotine dependence: Secondary | ICD-10-CM

## 2017-10-04 DIAGNOSIS — M1711 Unilateral primary osteoarthritis, right knee: Secondary | ICD-10-CM | POA: Diagnosis present

## 2017-10-04 DIAGNOSIS — J9600 Acute respiratory failure, unspecified whether with hypoxia or hypercapnia: Secondary | ICD-10-CM

## 2017-10-04 DIAGNOSIS — M109 Gout, unspecified: Secondary | ICD-10-CM | POA: Diagnosis present

## 2017-10-04 DIAGNOSIS — Z7982 Long term (current) use of aspirin: Secondary | ICD-10-CM | POA: Diagnosis not present

## 2017-10-04 DIAGNOSIS — E1121 Type 2 diabetes mellitus with diabetic nephropathy: Secondary | ICD-10-CM | POA: Diagnosis present

## 2017-10-04 DIAGNOSIS — R7989 Other specified abnormal findings of blood chemistry: Secondary | ICD-10-CM

## 2017-10-04 DIAGNOSIS — G4730 Sleep apnea, unspecified: Secondary | ICD-10-CM | POA: Diagnosis present

## 2017-10-04 DIAGNOSIS — I1310 Hypertensive heart and chronic kidney disease without heart failure, with stage 1 through stage 4 chronic kidney disease, or unspecified chronic kidney disease: Secondary | ICD-10-CM | POA: Diagnosis present

## 2017-10-04 HISTORY — DX: Pneumonia, unspecified organism: J18.9

## 2017-10-04 HISTORY — DX: Other specified abnormal findings of blood chemistry: R79.89

## 2017-10-04 HISTORY — DX: Other specified abnormalities of plasma proteins: R77.8

## 2017-10-04 HISTORY — DX: Hypertensive heart and chronic kidney disease without heart failure, with stage 1 through stage 4 chronic kidney disease, or unspecified chronic kidney disease: I13.10

## 2017-10-04 HISTORY — DX: Acute respiratory failure, unspecified whether with hypoxia or hypercapnia: J96.00

## 2017-10-04 HISTORY — DX: Dyspnea, unspecified: R06.00

## 2017-10-04 LAB — URINALYSIS, ROUTINE W REFLEX MICROSCOPIC
Bilirubin Urine: NEGATIVE
Glucose, UA: 50 mg/dL — AB
KETONES UR: NEGATIVE mg/dL
LEUKOCYTES UA: NEGATIVE
NITRITE: NEGATIVE
PROTEIN: 100 mg/dL — AB
RBC / HPF: >50 RBC/hpf — ABNORMAL HIGH (ref 0–5)
Specific Gravity, Urine: 1.014 (ref 1.005–1.030)
pH: 5 (ref 5.0–8.0)

## 2017-10-04 LAB — CBC WITH DIFFERENTIAL/PLATELET
BASOS ABS: 0 10*3/uL (ref 0.0–0.1)
BASOS PCT: 0 %
EOS ABS: 0.1 10*3/uL (ref 0.0–0.7)
EOS PCT: 0 %
HEMATOCRIT: 28.9 % — AB (ref 39.0–52.0)
Hemoglobin: 9 g/dL — ABNORMAL LOW (ref 13.0–17.0)
Lymphocytes Relative: 11 %
Lymphs Abs: 1.9 10*3/uL (ref 0.7–4.0)
MCH: 25.9 pg — ABNORMAL LOW (ref 26.0–34.0)
MCHC: 31.1 g/dL (ref 30.0–36.0)
MCV: 83 fL (ref 78.0–100.0)
MONO ABS: 1.5 10*3/uL — AB (ref 0.1–1.0)
Monocytes Relative: 8 %
NEUTROS ABS: 14.6 10*3/uL — AB (ref 1.7–7.7)
Neutrophils Relative %: 81 %
PLATELETS: 162 10*3/uL (ref 150–400)
RBC: 3.48 MIL/uL — ABNORMAL LOW (ref 4.22–5.81)
RDW: 17.6 % — AB (ref 11.5–15.5)
WBC: 18.1 10*3/uL — ABNORMAL HIGH (ref 4.0–10.5)

## 2017-10-04 LAB — HEMOGLOBIN A1C
Hgb A1c MFr Bld: 9 % — ABNORMAL HIGH (ref 4.8–5.6)
MEAN PLASMA GLUCOSE: 211.6 mg/dL

## 2017-10-04 LAB — COMPREHENSIVE METABOLIC PANEL
ALBUMIN: 3.5 g/dL (ref 3.5–5.0)
ALK PHOS: 92 U/L (ref 38–126)
ALT: 19 U/L (ref 17–63)
ANION GAP: 11 (ref 5–15)
AST: 22 U/L (ref 15–41)
BUN: 60 mg/dL — ABNORMAL HIGH (ref 6–20)
CHLORIDE: 111 mmol/L (ref 101–111)
CO2: 16 mmol/L — AB (ref 22–32)
CREATININE: 4.2 mg/dL — AB (ref 0.61–1.24)
Calcium: 10 mg/dL (ref 8.9–10.3)
GFR calc Af Amer: 16 mL/min — ABNORMAL LOW (ref >60)
GFR calc non Af Amer: 14 mL/min — ABNORMAL LOW (ref >60)
GLUCOSE: 231 mg/dL — AB (ref 65–99)
Potassium: 4.6 mmol/L (ref 3.5–5.1)
SODIUM: 138 mmol/L (ref 135–145)
Total Bilirubin: 0.9 mg/dL (ref 0.3–1.2)
Total Protein: 7.6 g/dL (ref 6.5–8.1)

## 2017-10-04 LAB — BRAIN NATRIURETIC PEPTIDE: B Natriuretic Peptide: 578.5 pg/mL — ABNORMAL HIGH (ref 0.0–100.0)

## 2017-10-04 LAB — CREATININE, SERUM
Creatinine, Ser: 4.16 mg/dL — ABNORMAL HIGH (ref 0.61–1.24)
GFR, EST AFRICAN AMERICAN: 16 mL/min — AB (ref >60)
GFR, EST NON AFRICAN AMERICAN: 14 mL/min — AB (ref >60)

## 2017-10-04 LAB — I-STAT VENOUS BLOOD GAS, ED
ACID-BASE DEFICIT: 8 mmol/L — AB (ref 0.0–2.0)
Bicarbonate: 16.9 mmol/L — ABNORMAL LOW (ref 20.0–28.0)
O2 SAT: 87 %
TCO2: 18 mmol/L — ABNORMAL LOW (ref 22–32)
pCO2, Ven: 30.8 mmHg — ABNORMAL LOW (ref 44.0–60.0)
pH, Ven: 7.347 (ref 7.250–7.430)
pO2, Ven: 55 mmHg — ABNORMAL HIGH (ref 32.0–45.0)

## 2017-10-04 LAB — CBG MONITORING, ED: GLUCOSE-CAPILLARY: 246 mg/dL — AB (ref 65–99)

## 2017-10-04 LAB — I-STAT TROPONIN, ED: TROPONIN I, POC: 0.09 ng/mL — AB (ref 0.00–0.08)

## 2017-10-04 LAB — I-STAT CG4 LACTIC ACID, ED: LACTIC ACID, VENOUS: 1.26 mmol/L (ref 0.5–1.9)

## 2017-10-04 MED ORDER — ASPIRIN EC 325 MG PO TBEC
325.0000 mg | DELAYED_RELEASE_TABLET | Freq: Every day | ORAL | Status: DC
  Administered 2017-10-04 – 2017-10-06 (×3): 325 mg via ORAL
  Filled 2017-10-04 (×3): qty 1

## 2017-10-04 MED ORDER — GLIMEPIRIDE 4 MG PO TABS
4.0000 mg | ORAL_TABLET | Freq: Two times a day (BID) | ORAL | Status: DC
  Administered 2017-10-05 (×2): 4 mg via ORAL
  Filled 2017-10-04 (×3): qty 1

## 2017-10-04 MED ORDER — INSULIN ASPART 100 UNIT/ML ~~LOC~~ SOLN
3.0000 [IU] | Freq: Three times a day (TID) | SUBCUTANEOUS | Status: DC
  Administered 2017-10-05 – 2017-10-07 (×2): 3 [IU] via SUBCUTANEOUS

## 2017-10-04 MED ORDER — POTASSIUM CHLORIDE CRYS ER 10 MEQ PO TBCR
10.0000 meq | EXTENDED_RELEASE_TABLET | Freq: Two times a day (BID) | ORAL | Status: DC
  Administered 2017-10-04 – 2017-10-07 (×6): 10 meq via ORAL
  Filled 2017-10-04 (×6): qty 1

## 2017-10-04 MED ORDER — SODIUM CHLORIDE 0.9 % IV SOLN
1.0000 g | INTRAVENOUS | Status: DC
  Administered 2017-10-05 – 2017-10-07 (×3): 1 g via INTRAVENOUS
  Filled 2017-10-04 (×3): qty 10

## 2017-10-04 MED ORDER — SODIUM CHLORIDE 0.9 % IV SOLN
1.0000 g | Freq: Once | INTRAVENOUS | Status: AC
  Administered 2017-10-04: 1 g via INTRAVENOUS
  Filled 2017-10-04: qty 10

## 2017-10-04 MED ORDER — ROSUVASTATIN CALCIUM 20 MG PO TABS
20.0000 mg | ORAL_TABLET | Freq: Every day | ORAL | Status: DC
  Administered 2017-10-04 – 2017-10-06 (×3): 20 mg via ORAL
  Filled 2017-10-04 (×4): qty 1

## 2017-10-04 MED ORDER — AMLODIPINE BESYLATE 10 MG PO TABS
10.0000 mg | ORAL_TABLET | Freq: Every day | ORAL | Status: DC
  Administered 2017-10-04 – 2017-10-07 (×4): 10 mg via ORAL
  Filled 2017-10-04 (×3): qty 1
  Filled 2017-10-04: qty 2

## 2017-10-04 MED ORDER — BENZONATATE 100 MG PO CAPS: 200.0000 mg | ORAL_CAPSULE | Freq: Three times a day (TID) | ORAL | Status: DC | PRN

## 2017-10-04 MED ORDER — CARVEDILOL 25 MG PO TABS
25.0000 mg | ORAL_TABLET | Freq: Two times a day (BID) | ORAL | Status: DC
  Administered 2017-10-05 – 2017-10-07 (×5): 25 mg via ORAL
  Filled 2017-10-04 (×5): qty 1

## 2017-10-04 MED ORDER — FUROSEMIDE 80 MG PO TABS
80.0000 mg | ORAL_TABLET | Freq: Every day | ORAL | Status: DC
  Administered 2017-10-04 – 2017-10-07 (×4): 80 mg via ORAL
  Filled 2017-10-04: qty 1
  Filled 2017-10-04: qty 4
  Filled 2017-10-04 (×2): qty 1

## 2017-10-04 MED ORDER — CLONIDINE HCL 0.1 MG PO TABS
0.1000 mg | ORAL_TABLET | Freq: Every day | ORAL | Status: DC
  Administered 2017-10-05 – 2017-10-07 (×3): 0.1 mg via ORAL
  Filled 2017-10-04 (×3): qty 1

## 2017-10-04 MED ORDER — ENOXAPARIN SODIUM 40 MG/0.4ML ~~LOC~~ SOLN
40.0000 mg | SUBCUTANEOUS | Status: DC
  Administered 2017-10-05 – 2017-10-07 (×3): 40 mg via SUBCUTANEOUS
  Filled 2017-10-04 (×4): qty 0.4

## 2017-10-04 MED ORDER — SODIUM CHLORIDE 0.9 % IV SOLN
INTRAVENOUS | Status: AC
Start: 2017-10-04 — End: 2017-10-05
  Administered 2017-10-04 – 2017-10-05 (×2): via INTRAVENOUS

## 2017-10-04 MED ORDER — SODIUM CHLORIDE 0.9 % IV SOLN: 1.0000 g | INTRAVENOUS | Status: DC

## 2017-10-04 MED ORDER — INSULIN ASPART 100 UNIT/ML ~~LOC~~ SOLN
0.0000 [IU] | Freq: Three times a day (TID) | SUBCUTANEOUS | Status: DC
  Administered 2017-10-05: 1 [IU] via SUBCUTANEOUS

## 2017-10-04 MED ORDER — ACETAMINOPHEN 500 MG PO TABS: 1000.0000 mg | ORAL_TABLET | Freq: Once | ORAL | Status: DC

## 2017-10-04 MED ORDER — ALLOPURINOL 300 MG PO TABS
300.0000 mg | ORAL_TABLET | Freq: Every day | ORAL | Status: DC
  Administered 2017-10-04 – 2017-10-07 (×4): 300 mg via ORAL
  Filled 2017-10-04 (×4): qty 1

## 2017-10-04 MED ORDER — TRIAMCINOLONE ACETONIDE 55 MCG/ACT NA AERO
1.0000 | INHALATION_SPRAY | Freq: Every day | NASAL | Status: DC
  Filled 2017-10-04 (×2): qty 10.8

## 2017-10-04 MED ORDER — GUAIFENESIN ER 600 MG PO TB12
600.0000 mg | ORAL_TABLET | Freq: Two times a day (BID) | ORAL | Status: DC
  Administered 2017-10-05 (×2): 600 mg via ORAL
  Filled 2017-10-04 (×2): qty 1

## 2017-10-04 MED ORDER — DOXYCYCLINE HYCLATE 100 MG PO TABS
100.0000 mg | ORAL_TABLET | Freq: Once | ORAL | Status: AC
  Administered 2017-10-04: 100 mg via ORAL
  Filled 2017-10-04: qty 1

## 2017-10-04 MED ORDER — LACTATED RINGERS IV BOLUS
500.0000 mL | Freq: Once | INTRAVENOUS | Status: AC
  Administered 2017-10-04: 500 mL via INTRAVENOUS

## 2017-10-04 MED ORDER — ISOSORB DINITRATE-HYDRALAZINE 20-37.5 MG PO TABS
1.0000 | ORAL_TABLET | Freq: Two times a day (BID) | ORAL | Status: DC
  Administered 2017-10-04 – 2017-10-07 (×6): 1 via ORAL
  Filled 2017-10-04 (×6): qty 1

## 2017-10-04 MED ORDER — DOXYCYCLINE HYCLATE 100 MG PO TABS
100.0000 mg | ORAL_TABLET | Freq: Two times a day (BID) | ORAL | Status: DC
  Administered 2017-10-05 – 2017-10-07 (×5): 100 mg via ORAL
  Filled 2017-10-04 (×5): qty 1

## 2017-10-04 MED ORDER — INSULIN LISPRO 100 UNIT/ML (KWIKPEN): 20.0000 [IU] | PEN_INJECTOR | Freq: Every day | SUBCUTANEOUS | Status: DC

## 2017-10-04 MED ORDER — INSULIN GLARGINE 100 UNIT/ML ~~LOC~~ SOLN
40.0000 [IU] | Freq: Every day | SUBCUTANEOUS | Status: DC
  Administered 2017-10-04 – 2017-10-06 (×3): 40 [IU] via SUBCUTANEOUS
  Filled 2017-10-04 (×5): qty 0.4

## 2017-10-04 MED ORDER — BENAZEPRIL HCL 40 MG PO TABS
40.0000 mg | ORAL_TABLET | Freq: Every day | ORAL | Status: DC
  Administered 2017-10-06 – 2017-10-07 (×2): 40 mg via ORAL
  Filled 2017-10-04 (×3): qty 1

## 2017-10-04 MED ORDER — CLONIDINE HCL 0.2 MG PO TABS
0.2000 mg | ORAL_TABLET | Freq: Every day | ORAL | Status: DC
  Administered 2017-10-04 – 2017-10-06 (×3): 0.2 mg via ORAL
  Filled 2017-10-04: qty 1
  Filled 2017-10-04: qty 2
  Filled 2017-10-04 (×2): qty 1

## 2017-10-04 NOTE — ED Provider Notes (Signed)
[facility] EMERGENCY DEPARTMENT Provider Note   CSN: [id number] Arrival date & time: 10/04/17  1538     History   Chief Complaint Chief Complaint  Patient presents with  . Shortness of Breath    HPI Matthew Wood is a 67 y.o. male.  66yo M w/ PMH incluidng CAD, CHF, CVA, OSA, HTN, T2DM who p/w cough and SOB.  For the past 2 to 3 days, the patient has had a productive cough associated with nasal congestion, sore throat, and intermittent fevers for which his wife has been giving him Tylenol, last dose just prior to arrival.  He has had progressively worsening shortness of breath.  His lower extremity edema has been stable recently.  He has been compliant with his medications. He reports son as sick contact. No chest pain.   The history is provided by the patient.    Past Medical History:  Diagnosis Date  . Anemia   . CHF (congestive heart failure) ([place])   . Coronary artery disease    a. LHC 04/27/10 left main patent, 30-40% LAD, mid circumflex 30%, 80% mid RCA accounting for the appearance of an infract/peri-infract ischemia on nuclear testing, LV EF of 30-45%--> medical therapy  . CVA (cerebrovascular accident) ([place]) 2011   potine; "Mer[place] Brougham balance problems since" (05/25/2016)  . Gout   . History of kidney stones    "passed them" (05/25/2016)  . Hyperlipidemia   . Hypertension   . LBBB (left bundle branch block)   . Multinodular goiter   . Obesity   . OSA on CPAP   . Presence of permanent cardiac pacemaker   . Renal insufficiency   . Type II diabetes mellitus [facility])     Patient Active Problem List   Diagnosis Date Noted  . CKD (chronic kidney disease), stage IV ([place]) 07/20/2016  . Chronic systolic heart failure ([place]) 02/21/2016  . Carotid aneurysm, left ([place]) 11/15/2013  . CAD (coronary artery disease), native coronary artery 11/15/2013  . 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 Essential hypertension 11/15/2013  . Sleep apnea 11/15/2013  .  Hyperlipidemia 11/15/2013  . Erectile dysfunction 11/15/2013    Past Surgical History:  Procedure Laterality Date  . COLONOSCOPY WITH PROPOFOL N/A 04/16/2014   Procedure: COLONOSCOPY WITH PROPOFOL;  Surgeon: [name], MD;  [facility];  Service: Endoscopy;  Laterality: N/A;  . EP IMPLANTABLE DEVICE N/A 05/25/2016   Procedure: BiV ICD Insertion CRT-D;  Surgeon: [name], MD;  Location: [facility];  Service: Cardiovascular;  Laterality: N/A;  . INSERT / REPLACE / REMOVE PACEMAKER  05/25/2016   biventricular pacemaker  . LAPAROSCOPIC CHOLECYSTECTOMY          Home Medications    Prior to Admission medications   Medication Sig Start Date End Date Taking? Authorizing Provider  acetaminophen (TYLENOL) 500 MG tablet Take 500-1,000 mg by mouth every 8 (eight) hours as needed (for pain).   Yes [provider]  allopurinol (ZYLOPRIM) 300 MG tablet Take 300 mg by mouth daily.   Yes [provider]  amLODipine-benazepril (LOTREL) 10-40 MG per capsule Take 1 capsule by mouth every morning.    Yes [provider]  aspirin EC 325 MG tablet Take 325 mg by mouth at bedtime.   Yes [provider]  carvedilol (COREG) 25 MG tablet Take 25 mg by mouth 2 (two) times daily with a meal.   Yes [provider]  cloNIDine (CATAPRES) 0.2 MG tablet Take  0.1-0.2 mg by mouth See admin instructions. Take 0.1 mg by mouth in the morning and 0.2 mg in the evening   Yes [provider]  furosemide (LASIX) 40 MG tablet Take 2 tablets (80 mg total) by mouth daily. 08/25/17  Yes Isaiah Serge, NP  glimepiride (AMARYL) 4 MG tablet Take 4 mg by mouth 2 (two) times daily.   Yes [provider]  HUMALOG KWIKPEN 100 UNIT/ML KiwkPen Inject 20 Units into the skin daily after breakfast.  09/19/17  Yes [provider]  Insulin Glargine (BASAGLAR KWIKPEN) 100 UNIT/ML SOPN Inject 40 Units into the skin at bedtime.  03/12/16  Yes  [provider]  isosorbide-hydrALAZINE (BIDIL) 20-37.5 MG per tablet Take 1 tablet by mouth 2 (two) times daily.   Yes [provider]  KLOR-CON M10 10 MEQ tablet TAKE 1 TABLET (10 MEQ TOTAL) BY MOUTH 2 (TWO) TIMES DAILY. 07/18/17  Yes Belva Crome, MD  metolazone (ZAROXOLYN) 2.5 MG tablet Take 1 tablet by mouth once weekly if you gain 3-4 pounds in a day 08/24/17  Yes Isaiah Serge, NP  NON FORMULARY CPAP: At bedtime   Yes [provider]  rosuvastatin (CRESTOR) 20 MG tablet Take 20 mg by mouth daily.   Yes [provider]  triamcinolone (NASACORT ALLERGY 24HR) 55 MCG/ACT AERO nasal inhaler Place 1 spray into the nose daily.   Yes [provider]    Family History Family History  Problem Relation Age of Onset  . Hypertension Mother   . Breast cancer Mother   . Heart attack Father   . Stroke Father   . Diabetes type II Father   . Hypertension Sister     Social History Social History   Tobacco Use  . Smoking status: Former Smoker    Packs/day: 0.50    Years: 27.00    Pack years: 13.50    Types: Cigarettes    Last attempt to quit: 03/25/2010    Years since quitting: 7.5  . Smokeless tobacco: Never Used  Substance Use Topics  . Alcohol use: Yes    Comment: 05/25/2016 "was an occasional drinker; maybe 4 drinks/week; stopped after stroke in 2011"  . Drug use: No     Allergies   Patient has no known allergies.   Review of Systems Review of Systems All other systems reviewed and are negative except that which was mentioned in HPI   Physical Exam Updated Vital Signs BP 129/65   Pulse 83   Temp (!) 100.8 F (38.2 C) (Oral)   Resp (!) 26   Ht 5\' 11"  (1.803 m)   SpO2 97%   BMI 41.84 kg/m   Physical Exam  Constitutional: He is oriented to person, place, and time. He appears well-developed and well-nourished.  Non-toxic appearance. He appears ill. No distress.  HENT:  Head: Normocephalic and atraumatic.  Mouth/Throat:  Oropharynx is clear and moist.  Moist mucous membranes  Eyes: Conjunctivae are normal.  Neck: Neck supple.  Cardiovascular: Normal rate and regular rhythm.  Murmur heard. Pulmonary/Chest: Tachypnea noted. No respiratory distress.  Increased WOB with rhonchi b/l, crackles in b/l bases  Abdominal: Soft. Bowel sounds are normal. He exhibits no distension. There is no tenderness.  Musculoskeletal:       Right lower leg: He exhibits edema.       Left lower leg: He exhibits edema.  Mild BLE edema  Neurological: He is alert and oriented to person, place, and time.  Fluent speech  Skin: Skin is warm and dry.  Psychiatric: He has a normal mood and affect. Judgment normal.  Nursing note and vitals reviewed.    ED Treatments / Results  Labs (all labs ordered are listed, but only abnormal results are displayed) Labs Reviewed  COMPREHENSIVE METABOLIC PANEL - Abnormal; Notable for the following components:      Result Value   CO2 16 (*)    Glucose, Bld 231 (*)    BUN 60 (*)    Creatinine, Ser 4.20 (*)    GFR calc non Af Amer 14 (*)    GFR calc Af Amer 16 (*)    All other components within normal limits  BRAIN NATRIURETIC PEPTIDE - Abnormal; Notable for the following components:   B Natriuretic Peptide 578.5 (*)    All other components within normal limits  CBC WITH DIFFERENTIAL/PLATELET - Abnormal; Notable for the following components:   WBC 18.1 (*)    RBC 3.48 (*)    Hemoglobin 9.0 (*)    HCT 28.9 (*)    MCH 25.9 (*)    RDW 17.6 (*)    Neutro Abs 14.6 (*)    Monocytes Absolute 1.5 (*)    All other components within normal limits  I-STAT TROPONIN, ED - Abnormal; Notable for the following components:   Troponin i, poc 0.09 (*)    All other components within normal limits  I-STAT VENOUS BLOOD GAS, ED - Abnormal; Notable for the following components:   pCO2, Ven 30.8 (*)    pO2, Ven 55.0 (*)    Bicarbonate 16.9 (*)    TCO2 18 (*)    Acid-base deficit 8.0 (*)    All other  components within normal limits  CULTURE, BLOOD (ROUTINE X 2)  CULTURE, BLOOD (ROUTINE X 2)  URINE CULTURE  CBC WITH DIFFERENTIAL/PLATELET  URINALYSIS, ROUTINE W REFLEX MICROSCOPIC  I-STAT CG4 LACTIC ACID, ED    EKG EKG Interpretation  Date/Time:  Tuesday October 04 2017 16:00:55 EDT Ventricular Rate:  99 PR Interval:    QRS Duration: 126 QT Interval:  337 QTC Calculation: 433 R Axis:   14 Text Interpretation:  Sinus rhythm Prolonged PR interval IVCD, consider atypical LBBB rate faster, non-specific ST changes inferiorly and laterally compared to previous Confirmed by Theotis Burrow [phone]) on 10/04/2017 4:09:22 PM Also confirmed by Theotis Burrow [phone]), editor [place], Jeannetta Nap [phone])  on 10/04/2017 4:12:52 PM   Radiology Dg Chest Port 1 View  Result Date: 10/04/2017 CLINICAL DATA:  Cough and fevers EXAM: PORTABLE CHEST 1 VIEW COMPARISON:  05/26/2016 FINDINGS: Cardiac shadow is stable with defibrillator again noted in satisfactory position. Lungs are well aerated bilaterally. Increased density is noted in the right upper lobe along the minor fissure in the right suprahilar region consistent with acute infiltrate. No other focal infiltrate is seen. No acute bony abnormality is noted. IMPRESSION: Changes in the right upper lobe consistent with acute infiltrate. Followup PA and lateral chest X-ray is recommended in 3-4 weeks following trial of antibiotic therapy to ensure resolution and exclude underlying malignancy. Electronically Signed   By: Inez Catalina M.D.   On: 10/04/2017 16:33    Procedures .Critical Care Performed by: Sharlett Iles, MD Authorized by: Sharlett Iles, MD   Critical care provider statement:    Critical care time (minutes):  30   Critical care time was exclusive of:  Separately billable procedures and treating other patients   Critical care was necessary to treat or prevent imminent or life-threatening deterioration of  the following conditions:   Sepsis   Critical care was time spent personally by me on the following activities:  Development of treatment plan with patient or surrogate, evaluation of patient's response to treatment, examination of patient, obtaining history from patient or surrogate, ordering and performing treatments and interventions, ordering and review of laboratory studies, ordering and review of radiographic studies, re-evaluation of patient's condition and review of old charts   (including critical care time)  Medications Ordered in ED Medications  lactated ringers bolus 500 mL (0 mLs Intravenous Stopped 10/04/17 1827)  cefTRIAXone (ROCEPHIN) 1 g in sodium chloride 0.9 % 100 mL IVPB (0 g Intravenous Stopped 10/04/17 1700)  doxycycline (VIBRA-TABS) tablet 100 mg (100 mg Oral Given 10/04/17 1649)     Initial Impression / Assessment and Plan / ED Course  I have reviewed the triage vital signs and the nursing notes.  Pertinent labs & imaging results that were available during my care of the patient were reviewed by me and considered in my medical decision making (see chart for details).     Pt ill appearing but non-toxic on exam, rhonchi b/l and crackles in bases. Placed on 4L O2. Febrile at 100.8. Initiated code sepsis w/ blood and urine cultures, small fluid bolus given h/o CHF, and CTX and doxycycline to cover community acquired pneumonia.   Labs show reassuring VBG, WBC 18, troponin 0.09, creatinine 4.2 which is similar to previous, BNP 578.  He denies any chest pain and I suspect that his mildly elevated troponin is related either to his CKD or mild volume overload.  No acute ischemic changes on EKG.  Discussed admission with [facility] hospitalist, Dr. Mariea Clonts, and pt admitted for further care.  Final Clinical Impressions(s) / ED Diagnoses   Final diagnoses:  Community acquired pneumonia of right upper lobe of lung [facility])  Hypoxia    ED Discharge Orders    None       Deyanna Mctier, Wenda Overland, MD 10/04/17  1918

## 2017-10-04 NOTE — ED Notes (Signed)
Dr. Rex Kras not in office I-stat troponin result given to RN Luellen Pucker

## 2017-10-04 NOTE — ED Triage Notes (Signed)
PT  Has been sick for 2-3 days with productive cough  And fever. Pt has Hx of CHF

## 2017-10-04 NOTE — H&P (Signed)
History and Physical    Matthew Wood RXV:[id number] DOB: 05-20-1951 DOA: 10/04/2017  PCP: Rogers Blocker, MD   Patient coming from: Home I have personally briefly reviewed patient's old medical records in [place]  Chief Complaint: Shortness of breath with history of congestive heart failure  HPI: Matthew Wood is a 67 y.o. male with medical history significant for coronary artery disease, congestive heart failure with ejection fraction of 35% last echo in 2017, stroke, obstructive sleep apnea uses CPAP at night, hypertension moderately well controlled, and type 2 diabetes who presents with cough and shortness of breath.  2 to 3 days prior to presentation the patient has complained of productive cough associated with nasal congestion, sore throat, and intermittent fevers.  His wife has been giving him Tylenol his last dose just prior to arrival to our emergency department.  Patient states that his sputum is green and he has had progressively worsening shortness of breath.  He has had stable lower extremity edema with no significant change in his congestive heart failure symptoms.  He denies requiring increased orthopnea but he sleeps every night in a recliner with his CPAP machine on.  He has been compliant with his medications his son has been recently ill but denies any chest pains.  He has had no PND and no increasing lower extremity edema or abdominal girth. Patient states he would not have come into the emergency department tonight had it not been for his wife who felt that he was getting sicker and sicker and required an evaluation.  The history is provided by the patient.     ED Course: Fever of 100.8 when I saw him it was up to 101.8. Blood glucose 231 Creatinine 4.2 (stable) Chest x-ray right upper lobe infiltrate BNP 578   Review of Systems: As per HPI otherwise all other systems reviewed and  negative.    Past Medical History:  Diagnosis Date  . Anemia   . CHF  (congestive heart failure) ([place])   . Coronary artery disease    a. LHC 04/27/10 left main patent, 30-40% LAD, mid circumflex 30%, 80% mid RCA accounting for the appearance of an infract/peri-infract ischemia on nuclear testing, LV EF of 30-45%--> medical therapy  . CVA (cerebrovascular accident) ([place]) 2011   potine; "little balance problems since" (05/25/2016)  . Gout   . History of kidney stones    "passed them" (05/25/2016)  . Hyperlipidemia   . Hypertension   . LBBB (left bundle branch block)   . Multinodular goiter   . Obesity   . OSA on CPAP   . Presence of permanent cardiac pacemaker   . Renal insufficiency   . Type II diabetes mellitus ([place])     Past Surgical History:  Procedure Laterality Date  . COLONOSCOPY WITH PROPOFOL N/A 04/16/2014   Procedure: COLONOSCOPY WITH PROPOFOL;  Surgeon: [name], MD;  Location: [facility];  Service: Endoscopy;  Laterality: N/A;  . EP IMPLANTABLE DEVICE N/A 05/25/2016   Procedure: BiV ICD Insertion CRT-D;  Surgeon: [name], MD;  Location: [facility];  Service: Cardiovascular;  Laterality: N/A;  . INSERT / REPLACE / REMOVE PACEMAKER  05/25/2016   biventricular pacemaker  . LAPAROSCOPIC CHOLECYSTECTOMY       reports that he quit smoking about 7 years ago. His smoking use included cigarettes. He has a 13.50 pack-year smoking history. He has never used smokeless tobacco. He reports that he drinks alcohol. He reports that he  does not use drugs.  No Known Allergies  Family History  Problem Relation Age of Onset  . Hypertension Mother   . Breast cancer Mother   . Heart attack Father   . Stroke Father   . Diabetes type II Father   . Hypertension Sister     Prior to Admission medications   Medication Sig Start Date End Date Taking? Authorizing Provider  acetaminophen (TYLENOL) 500 MG tablet Take 500-1,000 mg by mouth every 8 (eight) hours as needed (for pain).   Yes [provider]  allopurinol  (ZYLOPRIM) 300 MG tablet Take 300 mg by mouth daily.   Yes [provider]  amLODipine-benazepril (LOTREL) 10-40 MG per capsule Take 1 capsule by mouth every morning.    Yes [provider]  aspirin EC 325 MG tablet Take 325 mg by mouth at bedtime.   Yes [provider]  carvedilol (COREG) 25 MG tablet Take 25 mg by mouth 2 (two) times daily with a meal.   Yes [provider]  cloNIDine (CATAPRES) 0.2 MG tablet Take 0.1-0.2 mg by mouth See admin instructions. Take 0.1 mg by mouth in the morning and 0.2 mg in the evening   Yes [provider]  furosemide (LASIX) 40 MG tablet Take 2 tablets (80 mg total) by mouth daily. 08/25/17  Yes Isaiah Serge, NP  glimepiride (AMARYL) 4 MG tablet Take 4 mg by mouth 2 (two) times daily.   Yes [provider]  HUMALOG KWIKPEN 100 UNIT/ML KiwkPen Inject 20 Units into the skin daily after breakfast.  09/19/17  Yes [provider]  Insulin Glargine (BASAGLAR KWIKPEN) 100 UNIT/ML SOPN Inject 40 Units into the skin at bedtime.  03/12/16  Yes [provider]  isosorbide-hydrALAZINE (BIDIL) 20-37.5 MG per tablet Take 1 tablet by mouth 2 (two) times daily.   Yes [provider]  KLOR-CON M10 10 MEQ tablet TAKE 1 TABLET (10 MEQ TOTAL) BY MOUTH 2 (TWO) TIMES DAILY. 07/18/17  Yes Belva Crome, MD  metolazone (ZAROXOLYN) 2.5 MG tablet Take 1 tablet by mouth once weekly if you gain 3-4 pounds in a day 08/24/17  Yes Isaiah Serge, NP  NON FORMULARY CPAP: At bedtime   Yes [provider]  rosuvastatin (CRESTOR) 20 MG tablet Take 20 mg by mouth daily.   Yes [provider]  triamcinolone (NASACORT ALLERGY 24HR) 55 MCG/ACT AERO nasal inhaler Place 1 spray into the nose daily.   Yes [provider]    Physical Exam: Vitals:   10/04/17 2315 10/04/17 2316 10/04/17 2345 10/05/17 0000  BP: (!) 154/79  (!) 151/77 (!) 142/77  Pulse: 86  81 81  Resp:  (!) 23 (!) 25 (!) 23  Temp:       TempSrc:      SpO2: 95%  97% 97%  Height:        Constitutional: NAD, calm, comfortable, ill-appearing Vitals:   10/04/17 2315 10/04/17 2316 10/04/17 2345 10/05/17 0000  BP: (!) 154/79  (!) 151/77 (!) 142/77  Pulse: 86  81 81  Resp:  (!) 23 (!) 25 (!) 23  Temp:      TempSrc:      SpO2: 95%  97% 97%  Height:       Eyes: PERRL, lids and conjunctivae normal ENMT: Mucous membranes are moist. Posterior pharynx clear of any exudate or lesions.Normal dentition.  Neck: normal, supple, no masses, no thyromegaly Respiratory: Coarse breath sounds no wheezing appreciated but a great deal  of rhonchi throughout no crackles at the bases.  Respiratory effort is increased no accessory muscle use. Cardiovascular: Regular rate and rhythm, no murmurs / rubs / gallops.  1+ edema of the feet not including the calves. 2+ pedal pulses. No carotid bruits.  Abdomen: no tenderness, no masses palpated. No hepatosplenomegaly. Bowel sounds positive.  Musculoskeletal: no clubbing / cyanosis. No joint deformity upper and lower extremities. Good ROM, no contractures. Normal muscle tone.  Skin: no rashes, lesions, ulcers. No induration Neurologic: CN 2-12 grossly intact. Sensation intact, DTR normal. Strength 5/5 in all 4.  Psychiatric: Normal judgment and insight. Alert and oriented x 3. Normal mood.    Labs on Admission: I have personally reviewed following labs and imaging studies  CBC: Recent Labs  Lab 10/04/17 1740  WBC 18.1*  NEUTROABS 14.6*  HGB 9.0*  HCT 28.9*  MCV 83.0  PLT 914   Basic Metabolic Panel: Recent Labs  Lab 10/04/17 1627 10/04/17 2103  NA 138  --   K 4.6  --   CL 111  --   CO2 16*  --   GLUCOSE 231*  --   BUN 60*  --   CREATININE 4.20* 4.16*  CALCIUM 10.0  --    GFR: CrCl cannot be calculated (Unknown ideal weight.). Liver Function Tests: Recent Labs  Lab 10/04/17 1627  AST 22  ALT 19  ALKPHOS 92  BILITOT 0.9  PROT 7.6  ALBUMIN 3.5   BNP (last 3  results) Recent Labs    08/11/17 1645  PROBNP 740*   Troponin 0 0.09 and elevated   Urine analysis:    Component Value Date/Time   COLORURINE YELLOW 10/04/2017 1627   APPEARANCEUR HAZY (A) 10/04/2017 1627   LABSPEC 1.014 10/04/2017 1627   PHURINE 5.0 10/04/2017 1627   GLUCOSEU 50 (A) 10/04/2017 1627   HGBUR LARGE (A) 10/04/2017 1627   BILIRUBINUR NEGATIVE 10/04/2017 1627   KETONESUR NEGATIVE 10/04/2017 1627   PROTEINUR 100 (A) 10/04/2017 1627   UROBILINOGEN 1.0 02/07/2010 1805   NITRITE NEGATIVE 10/04/2017 1627   LEUKOCYTESUR NEGATIVE 10/04/2017 1627    Radiological Exams on Admission: Dg Chest Port 1 View  Result Date: 10/04/2017 CLINICAL DATA:  Cough and fevers EXAM: PORTABLE CHEST 1 VIEW COMPARISON:  05/26/2016 FINDINGS: Cardiac shadow is stable with defibrillator again noted in satisfactory position. Lungs are well aerated bilaterally. Increased density is noted in the right upper lobe along the minor fissure in the right suprahilar region consistent with acute infiltrate. No other focal infiltrate is seen. No acute bony abnormality is noted. IMPRESSION: Changes in the right upper lobe consistent with acute infiltrate. Followup PA and lateral chest X-ray is recommended in 3-4 weeks following trial of antibiotic therapy to ensure resolution and exclude underlying malignancy. Electronically Signed   By: Inez Catalina M.D.   On: 10/04/2017 16:33    EKG: Independently reviewed.  Sinus rhythm with a left bundle branch  Assessment/Plan Principal Problem:   Acute respiratory failure (HCC) Active Problems:   Right upper lobe pneumonia (HCC)   Chronic systolic heart failure (HCC)   CKD (chronic kidney disease), stage IV (HCC)   Cardiorenal syndrome with renal failure   Elevated troponin   CAD (coronary artery disease), native coronary artery   Sleep apnea   Hyperlipidemia   Arthritis of right knee   Hematuria   Type 2 diabetes mellitus with nephropathy ([place])   1.  Acute  respiratory failure: Patient presents to the emergency department requiring significant amounts of oxygen  now requiring 4 L.  Patient does not use any oxygen at home.  Given the huge oxygen requirement and previous history of no oxygen use in conjunction with clinical presentation including increased respiratory effort cough congestion and pulmonary infiltrates the patient is in acute respiratory failure requiring hospitalization for prevention of respiratory arrest.  He will be placed in the hospital started on telemetry monitoring as well as repletion of oxygen.  Given his pneumonia I expect his oxygenation to worsen before it gets better as I expect his pneumonia will "blossom".  2.  Right upper lobe pneumonia: Patient with community-acquired pneumonia.  Will be treated with ceftriaxone and doxycycline.  He quit smoking many years ago and has not continue to do so.  Fortunately this should help him in recovery.  He is making significant amounts of green sputum which we will send to the lab for evaluation.  Patient is encouraged to get out of bed and sit in the chair to improve his respiratory dynamics.  3.  Chronic systolic heart failure: Patient with known ejection fraction of less than 35% and has an AICD in place.  We will continue his home medications.  As patient does not have decompensated systolic heart failure will not repeat echocardiogram at this point.  He may benefit from outpatient echocardiogram when he is stable from a pulmonary standpoint.  4.  Chronic kidney disease stage IV: Patient with a baseline creatinine at his current rate of approximately 4.2 his BUN is approximately 60.  He is currently stable with his chronic kidney disease however given his pulmonary decompensation he is at significant risk for worsening renal failure.  5.  Cardiorenal syndrome with renal failure: Patient is an extreme risk of developing significant complications from his severe congestive heart failure and  stage IV chronic kidney disease.  He will be weighed daily, we will monitor his ins and outs, will be very careful about fluid management and avoid nephrotoxic medications.  6.  Elevated troponin: We will cycle troponins but I suspect that this is due to strain given the patient's extremely weak heart.  7.  Coronary artery disease of native coronary artery: This is noted continue home medication management.  8.  Sleep apnea: Continue home CPAP.  9.  Hyperlipidemia we will switch his simvastatin to atorvastatin per formulary in the hospital will resume simvastatin at discharge.  10.  Arthritis of right knee: Patient now uses a cane for ambulation.  He may benefit from further evaluation and perhaps physical therapy.  He has had a steroid injection in the knee and had some relief.  11.  Hematuria: Likely this was due to placement of Foley catheter.  I have requested that Foley catheter be discontinued at 7 AM on the morning after admission.  Would recommend recheck of a urinalysis in the next 3 to 4 days.  12.  Diabetes mellitus type 2: We will place patient on sliding scale protocol and monitor sugars closely.   DVT prophylaxis: Lovenox Code Status: Full code Family Communication: Spoke extensively with patient's wife who is present at the time of admission. Disposition Plan: Likely home in 3 to 4 days Consults called: None Admission status: Inpatient   Lady Deutscher MD FACP [facility] Pager [phone]  If 7PM-7AM, please contact night-coverage [email or web address] Password [facility]  10/05/2017, 12:17 AM

## 2017-10-04 NOTE — ED Notes (Signed)
Pt given food and water

## 2017-10-04 NOTE — Progress Notes (Signed)
Patient's CPAP is set up and ready for use at the bedside. Patient stated that he will call when he is ready for use.

## 2017-10-04 NOTE — ED Notes (Signed)
Messaged pharmacy to send pts meds to [facility]

## 2017-10-05 ENCOUNTER — Encounter (HOSPITAL_COMMUNITY): Payer: Self-pay | Admitting: Internal Medicine

## 2017-10-05 ENCOUNTER — Other Ambulatory Visit: Payer: Self-pay

## 2017-10-05 DIAGNOSIS — R319 Hematuria, unspecified: Secondary | ICD-10-CM

## 2017-10-05 DIAGNOSIS — M1711 Unilateral primary osteoarthritis, right knee: Secondary | ICD-10-CM | POA: Diagnosis present

## 2017-10-05 DIAGNOSIS — E1121 Type 2 diabetes mellitus with diabetic nephropathy: Secondary | ICD-10-CM

## 2017-10-05 HISTORY — DX: Hematuria, unspecified: R31.9

## 2017-10-05 HISTORY — DX: Unilateral primary osteoarthritis, right knee: M17.11

## 2017-10-05 HISTORY — DX: Type 2 diabetes mellitus with diabetic nephropathy: E11.21

## 2017-10-05 LAB — BASIC METABOLIC PANEL
Anion gap: 8 (ref 5–15)
BUN: 56 mg/dL — AB (ref 6–20)
CO2: 18 mmol/L — ABNORMAL LOW (ref 22–32)
CREATININE: 4.07 mg/dL — AB (ref 0.61–1.24)
Calcium: 9.6 mg/dL (ref 8.9–10.3)
Chloride: 112 mmol/L — ABNORMAL HIGH (ref 101–111)
GFR, EST AFRICAN AMERICAN: 16 mL/min — AB (ref >60)
GFR, EST NON AFRICAN AMERICAN: 14 mL/min — AB (ref >60)
Glucose, Bld: 150 mg/dL — ABNORMAL HIGH (ref 65–99)
POTASSIUM: 4.2 mmol/L (ref 3.5–5.1)
SODIUM: 138 mmol/L (ref 135–145)

## 2017-10-05 LAB — TROPONIN I
TROPONIN I: 0.12 ng/mL — AB (ref <0.03)
TROPONIN I: 0.08 ng/mL — AB (ref <0.03)
TROPONIN I: 0.08 ng/mL — AB (ref <0.03)

## 2017-10-05 LAB — CBC
HEMATOCRIT: 27 % — AB (ref 39.0–52.0)
Hemoglobin: 8.5 g/dL — ABNORMAL LOW (ref 13.0–17.0)
MCH: 26.2 pg (ref 26.0–34.0)
MCHC: 31.5 g/dL (ref 30.0–36.0)
MCV: 83.3 fL (ref 78.0–100.0)
PLATELETS: 169 10*3/uL (ref 150–400)
RBC: 3.24 MIL/uL — ABNORMAL LOW (ref 4.22–5.81)
RDW: 17.5 % — AB (ref 11.5–15.5)
WBC: 16.9 10*3/uL — AB (ref 4.0–10.5)

## 2017-10-05 LAB — GLUCOSE, CAPILLARY
GLUCOSE-CAPILLARY: 132 mg/dL — AB (ref 65–99)
Glucose-Capillary: 139 mg/dL — ABNORMAL HIGH (ref 65–99)

## 2017-10-05 LAB — CBG MONITORING, ED
GLUCOSE-CAPILLARY: 84 mg/dL (ref 65–99)
Glucose-Capillary: 116 mg/dL — ABNORMAL HIGH (ref 65–99)

## 2017-10-05 LAB — HIV ANTIBODY (ROUTINE TESTING W REFLEX): HIV Screen 4th Generation wRfx: NONREACTIVE

## 2017-10-05 LAB — STREP PNEUMONIAE URINARY ANTIGEN: STREP PNEUMO URINARY ANTIGEN: NEGATIVE

## 2017-10-05 MED ORDER — GUAIFENESIN ER 600 MG PO TB12
1200.0000 mg | ORAL_TABLET | Freq: Two times a day (BID) | ORAL | Status: DC
  Administered 2017-10-05 – 2017-10-07 (×4): 1200 mg via ORAL
  Filled 2017-10-05 (×4): qty 2

## 2017-10-05 NOTE — ED Notes (Signed)
Patient CBG was 116. 

## 2017-10-05 NOTE — Plan of Care (Signed)
  Problem: Education: Goal: Knowledge of General Education information will improve Outcome: Progressing   Problem: Health Behavior/Discharge Planning: Goal: Ability to manage health-related needs will improve Outcome: Progressing   Problem: Clinical Measurements: Goal: Ability to maintain clinical measurements within normal limits will improve Outcome: Progressing Goal: Will remain free from infection Outcome: Progressing Goal: Diagnostic test results will improve Outcome: Progressing Goal: Respiratory complications will improve Outcome: Progressing Goal: Cardiovascular complication will be avoided Outcome: Progressing   Problem: Clinical Measurements: Goal: Will remain free from infection Outcome: Progressing   Problem: Clinical Measurements: Goal: Diagnostic test results will improve Outcome: Progressing   Problem: Clinical Measurements: Goal: Respiratory complications will improve Outcome: Progressing   Problem: Clinical Measurements: Goal: Cardiovascular complication will be avoided Outcome: Progressing   Problem: Activity: Goal: Risk for activity intolerance will decrease Outcome: Progressing   Problem: Nutrition: Goal: Adequate nutrition will be maintained Outcome: Progressing   Problem: Coping: Goal: Level of anxiety will decrease Outcome: Progressing   Problem: Elimination: Goal: Will not experience complications related to bowel motility Outcome: Progressing Goal: Will not experience complications related to urinary retention Outcome: Progressing   Problem: Pain Managment: Goal: General experience of comfort will improve Outcome: Progressing   Problem: Safety: Goal: Ability to remain free from injury will improve Outcome: Progressing   Problem: Safety: Goal: Ability to remain free from injury will improve Outcome: Progressing   Problem: Skin Integrity: Goal: Risk for impaired skin integrity will decrease Outcome: Progressing

## 2017-10-05 NOTE — ED Notes (Signed)
Patient CBG was 84, Nurse Brook was informed. Pt. Was given a coke w/ Ice.

## 2017-10-05 NOTE — ED Notes (Signed)
Attempted to draw blood unable, called phlebotomy

## 2017-10-05 NOTE — Progress Notes (Signed)
Pt family member places him on CPAP for the night. RT will continue to monitor.

## 2017-10-05 NOTE — ED Notes (Signed)
Phlebotomy at bedside.

## 2017-10-05 NOTE — ED Notes (Signed)
Admitting at bedside 

## 2017-10-05 NOTE — ED Notes (Signed)
No abnormalities noticed on skin pt denies any skin problems

## 2017-10-05 NOTE — ED Notes (Signed)
Carb Modified diet lunch tray ordered.

## 2017-10-05 NOTE — Progress Notes (Signed)
Inpatient Diabetes Program Recommendations  AACE/ADA: New Consensus Statement on Inpatient Glycemic Control (2015)  Target Ranges:  Prepandial:   less than 140 mg/dL      Peak postprandial:   less than 180 mg/dL (1-2 hours)      Critically ill patients:  140 - 180 mg/dL   Lab Results  Component Value Date   GLUCAP 116 (H) 10/05/2017   HGBA1C 9.0 (H) 10/04/2017   Spoke with patient regarding home regimen for diabetes management. Confirmed home meds and patient denies skipping doses. Patient admits to only checking BS once a daily and that they run in the 200's mostly. Has access to all needed supplies and meter.  Reviewed patient's current A1c of 9.0%. Explained what a A1c is and what it measures. Also reviewed goal A1c with patient, importance of good glucose control @ home, and blood sugar goals. Briefly reminded patient of need for glycemic control, normal BS, the need for insulin in his body, co-morbidies associated, and the importance of dietary changes.  Eduction provided on [employer] as he would qualify for our study. Patient plans to discuss this with his wife. At this time no additional recommendations at this time.   Thanks, Bronson Curb, MSN, RNC-OB Diabetes Coordinator [phone] (8a-5p)

## 2017-10-05 NOTE — Progress Notes (Signed)
Patient Demographics:    Matthew Wood, is a 67 y.o. male, DOB - [birth year], YME:[id number]  Admit date - 10/04/2017   Admitting Physician Lady Deutscher, MD  Outpatient Primary MD for the patient is Rogers Blocker, MD  LOS - 1   Chief Complaint  Patient presents with  . Shortness of Breath        Subjective:    Matthew Wood today has no fevers, no emesis,  No chest pain,  dyspnea on minimal exertion persist, cough with colored sputum, RN Brooke at bedside  Assessment  & Plan :    Principal Problem:   Acute respiratory failure ([place]) Active Problems:   CAD (coronary artery disease), native coronary artery   Sleep apnea   Hyperlipidemia   Chronic systolic heart failure (HCC)   CKD (chronic kidney disease), stage IV (HCC)   Right upper lobe pneumonia (HCC)   Cardiorenal syndrome with renal failure   Elevated troponin   Arthritis of right knee   Hematuria   Type 2 diabetes mellitus with nephropathy (HCC)   1)Acute hypoxic respiratory failure secondary to right-sided pneumonia-   continue to wean off oxygen ,treat pneumonia as noted below  2) right upper lobe pneumonia-community-acquired with hypoxia, continue doxycycline on Rocephin pending cultures, continue bronchodilators, mucolytic's and supplemental oxygen   3) sepsis secondary to #2 above--on admission patient met sepsis criteria with fever, leukocytosis with white count of 18 , tachypnea and and hypoxia with acute respiratory failure, tachycardia response may have been blunted by high-dose Coreg, continue doxycycline and Rocephin pending cultures as above #2  4)HFrEF-last known EF in the 35% range, patient has AICD in situ, continue benazepril 40 mg daily, Coreg 25 mg twice daily, Lasix 80 mg daily, c/n Bidil and crestor  5)DM-A1c is 9.0, needs to be more compliant with diabetic regimen emphasized to patient, restart Lantus at 40 units  nightly along with NovoLog 3 units 3 times daily with meals and sliding scale insulin, also restart Amaryl 4 mg twice daily with meals  6)CKD -stage IV- V-renal function appears to be close to baseline, with creatinine above 4, avoid nephrotoxic agents, monitor carefully  7)HTN- stage 2-BP control has improved, continue clonidine 0.1 mg daily along with 0.2 mg nightly, amlodipine 10 mg daily,  Code Status : full   Disposition Plan  : home  Consults  :  none   DVT Prophylaxis  :  Lovenox    Lab Results  Component Value Date   PLT 169 10/05/2017    Inpatient Medications  Scheduled Meds: . allopurinol  300 mg Oral Daily  . amLODipine  10 mg Oral Daily  . aspirin EC  325 mg Oral QHS  . benazepril  40 mg Oral Daily  . carvedilol  25 mg Oral BID WC  . cloNIDine  0.1 mg Oral Daily  . cloNIDine  0.2 mg Oral QHS  . doxycycline  100 mg Oral Q12H  . enoxaparin (LOVENOX) injection  40 mg Subcutaneous Q24H  . furosemide  80 mg Oral Daily  . glimepiride  4 mg Oral BID WC  . guaiFENesin  1,200 mg Oral BID  . insulin aspart  0-9 Units Subcutaneous TID WC  . insulin aspart  3 Units Subcutaneous TID  WC  . insulin glargine  40 Units Subcutaneous QHS  . isosorbide-hydrALAZINE  1 tablet Oral BID  . potassium chloride  10 mEq Oral BID  . rosuvastatin  20 mg Oral q1800  . triamcinolone  1 spray Nasal Daily   Continuous Infusions: . cefTRIAXone (ROCEPHIN)  IV     PRN Meds:.    Anti-infectives (From admission, onward)   Start     Dose/Rate Route Frequency Ordered Stop   10/05/17 1700  cefTRIAXone (ROCEPHIN) 1 g in sodium chloride 0.9 % 100 mL IVPB  Status:  Discontinued     1 g 200 mL/hr over 30 Minutes Intravenous Every 24 hours 10/04/17 2002 10/04/17 2022   10/05/17 1700  cefTRIAXone (ROCEPHIN) 1 g in sodium chloride 0.9 % 100 mL IVPB     1 g 200 mL/hr over 30 Minutes Intravenous Every 24 hours 10/04/17 2022 10/11/17 1659   10/05/17 1000  doxycycline (VIBRA-TABS) tablet 100 mg       100 mg Oral Every 12 hours 10/04/17 2002 10/11/17 0959   10/04/17 1615  cefTRIAXone (ROCEPHIN) 1 g in sodium chloride 0.9 % 100 mL IVPB     1 g 200 mL/hr over 30 Minutes Intravenous  Once 10/04/17 1607 10/04/17 1700   10/04/17 1615  doxycycline (VIBRA-TABS) tablet 100 mg     100 mg Oral  Once 10/04/17 1607 10/04/17 1649        Objective:   Vitals:   10/05/17 1100 10/05/17 1200 10/05/17 1300 10/05/17 1410  BP: 125/69 (!) 110/58 129/69 (!) 144/83  Pulse: 65 64 71 66  Resp: 20 19 (!) 21 18  Temp:    98.3 F (36.8 C)  TempSrc:    Oral  SpO2: 95% 95% 97%   Weight:    (!) 137.9 kg (304 lb)  Height:    '5\' 11"'  (1.803 m)    Wt Readings from Last 3 Encounters:  10/05/17 (!) 137.9 kg (304 lb)  09/06/17 136.1 kg (300 lb)  08/30/17 135.6 kg (299 lb)     Intake/Output Summary (Last 24 hours) at 10/05/2017 1701 Last data filed at 10/05/2017 1639 Gross per 24 hour  Intake 1240 ml  Output 850 ml  Net 390 ml     Physical Exam  Gen:- Awake Alert, able to speak in sentences, dyspnea on minimal exertion persist HEENT:- [place].AT, No sclera icterus Neck-Supple Neck,No JVD,.  Lungs-diminished in bases scattered rhonchi on the right  CV- S1, S2 normal, AICD in situ Abd-  +ve B.Sounds, Abd Soft, No tenderness,    Extremity/Skin:-Trace edema,  psych-affect is appropriate, oriented x3 Neuro-no new focal deficits, no tremors   Data Review:   Micro Results Recent Results (from the past 240 hour(s))  Blood Culture (routine x 2)     Status: None (Preliminary result)   Collection Time: 10/04/17  4:25 PM  Result Value Ref Range Status   Specimen Description BLOOD LEFT ANTECUBITAL  Final   Special Requests   Final    BOTTLES DRAWN AEROBIC AND ANAEROBIC Blood Culture adequate volume   Culture   Final    NO GROWTH < 24 HOURS Performed at [facility], [place] [address], [place], [place] [address]    Report Status PENDING  Incomplete  Blood Culture (routine x 2)     Status: None  (Preliminary result)   Collection Time: 10/04/17  4:27 PM  Result Value Ref Range Status   Specimen Description BLOOD RIGHT ANTECUBITAL  Final   Special Requests  Final    BOTTLES DRAWN AEROBIC AND ANAEROBIC Blood Culture adequate volume   Culture   Final    NO GROWTH < 24 HOURS Performed at [facility], [place] [address], [place], [place] [address]    Report Status PENDING  Incomplete    Radiology Reports Dg Chest Port 1 View  Result Date: 10/04/2017 CLINICAL DATA:  Cough and fevers EXAM: PORTABLE CHEST 1 VIEW COMPARISON:  05/26/2016 FINDINGS: Cardiac shadow is stable with defibrillator again noted in satisfactory position. Lungs are well aerated bilaterally. Increased density is noted in the right upper lobe along the minor fissure in the right suprahilar region consistent with acute infiltrate. No other focal infiltrate is seen. No acute bony abnormality is noted. IMPRESSION: Changes in the right upper lobe consistent with acute infiltrate. Followup PA and lateral chest X-ray is recommended in 3-4 weeks following trial of antibiotic therapy to ensure resolution and exclude underlying malignancy. Electronically Signed   By: Inez Catalina M.D.   On: 10/04/2017 16:33     CBC Recent Labs  Lab 10/04/17 1740 10/05/17 0535  WBC 18.1* 16.9*  HGB 9.0* 8.5*  HCT 28.9* 27.0*  PLT 162 169  MCV 83.0 83.3  MCH 25.9* 26.2  MCHC 31.1 31.5  RDW 17.6* 17.5*  LYMPHSABS 1.9  --   MONOABS 1.5*  --   EOSABS 0.1  --   BASOSABS 0.0  --     Chemistries  Recent Labs  Lab 10/04/17 1627 10/04/17 2103 10/05/17 0535  NA 138  --  138  K 4.6  --  4.2  CL 111  --  112*  CO2 16*  --  18*  GLUCOSE 231*  --  150*  BUN 60*  --  56*  CREATININE 4.20* 4.16* 4.07*  CALCIUM 10.0  --  9.6  AST 22  --   --   ALT 19  --   --   ALKPHOS 92  --   --   BILITOT 0.9  --   --    ------------------------------------------------------------------------------------------------------------------ No results  for input(s): CHOL, HDL, LDLCALC, TRIG, CHOLHDL, LDLDIRECT in the last 72 hours.  Lab Results  Component Value Date   HGBA1C 9.0 (H) 10/04/2017   ------------------------------------------------------------------------------------------------------------------ No results for input(s): TSH, T4TOTAL, T3FREE, THYROIDAB in the last 72 hours.  Invalid input(s): FREET3 ------------------------------------------------------------------------------------------------------------------ No results for input(s): VITAMINB12, FOLATE, FERRITIN, TIBC, IRON, RETICCTPCT in the last 72 hours.  Coagulation profile No results for input(s): INR, PROTIME in the last 168 hours.  No results for input(s): DDIMER in the last 72 hours.  Cardiac Enzymes Recent Labs  Lab 10/05/17 0535 10/05/17 1427  TROPONINI 0.12* 0.08*   ------------------------------------------------------------------------------------------------------------------    Component Value Date/Time   BNP 578.5 (H) 10/04/2017 1627   BNP 274.2 (H) 02/05/2016 1442     Matthew Wood M.D on 10/05/2017 at 5:01 PM  Between 7am to 7pm - Pager - [phone]  After 7pm go to [email or web address] - password [email or web address]  [facility] -  Office  [phone]   Voice Recognition Viviann Spare dictation system was used to create this note, attempts have been made to correct errors. Please contact the author with questions and/or clarifications.

## 2017-10-06 ENCOUNTER — Inpatient Hospital Stay (HOSPITAL_COMMUNITY): Payer: 59

## 2017-10-06 LAB — CBC
HEMATOCRIT: 25.9 % — AB (ref 39.0–52.0)
Hemoglobin: 8.3 g/dL — ABNORMAL LOW (ref 13.0–17.0)
MCH: 26.7 pg (ref 26.0–34.0)
MCHC: 32 g/dL (ref 30.0–36.0)
MCV: 83.3 fL (ref 78.0–100.0)
PLATELETS: 148 10*3/uL — AB (ref 150–400)
RBC: 3.11 MIL/uL — AB (ref 4.22–5.81)
RDW: 18 % — ABNORMAL HIGH (ref 11.5–15.5)
WBC: 14.2 10*3/uL — ABNORMAL HIGH (ref 4.0–10.5)

## 2017-10-06 LAB — BASIC METABOLIC PANEL
ANION GAP: 9 (ref 5–15)
BUN: 60 mg/dL — AB (ref 6–20)
CO2: 17 mmol/L — AB (ref 22–32)
Calcium: 9.3 mg/dL (ref 8.9–10.3)
Chloride: 111 mmol/L (ref 101–111)
Creatinine, Ser: 4.32 mg/dL — ABNORMAL HIGH (ref 0.61–1.24)
GFR calc Af Amer: 15 mL/min — ABNORMAL LOW (ref >60)
GFR, EST NON AFRICAN AMERICAN: 13 mL/min — AB (ref >60)
GLUCOSE: 84 mg/dL (ref 65–99)
POTASSIUM: 4.1 mmol/L (ref 3.5–5.1)
Sodium: 137 mmol/L (ref 135–145)

## 2017-10-06 LAB — GLUCOSE, CAPILLARY
GLUCOSE-CAPILLARY: 40 mg/dL — AB (ref 65–99)
GLUCOSE-CAPILLARY: 75 mg/dL (ref 65–99)
GLUCOSE-CAPILLARY: 150 mg/dL — AB (ref 65–99)
Glucose-Capillary: 58 mg/dL — ABNORMAL LOW (ref 65–99)
Glucose-Capillary: 150 mg/dL — ABNORMAL HIGH (ref 65–99)

## 2017-10-06 LAB — URINE CULTURE: Culture: <10000 — AB

## 2017-10-06 MED ORDER — GLIMEPIRIDE 4 MG PO TABS
4.0000 mg | ORAL_TABLET | Freq: Two times a day (BID) | ORAL | Status: DC
  Administered 2017-10-07: 4 mg via ORAL
  Filled 2017-10-06: qty 1

## 2017-10-06 NOTE — Progress Notes (Signed)
Pt is alert and oriented with 02 PRN for SOB, coughing thick brown mucus, weak and up to chair, refused to walk poor eater, plan to continue antibiotics need pt therapy.

## 2017-10-06 NOTE — Progress Notes (Signed)
Patient placed himself on home CPAP unit for the night.  

## 2017-10-06 NOTE — Progress Notes (Signed)
Pt is alert and oriented x4 on Cpap blood sugar drop to 40s on oral glycemic meds. Cr elevated . Paged Md to make aware.

## 2017-10-06 NOTE — Progress Notes (Signed)
Patient Demographics:    Matthew Wood, is a 67 y.o. male, DOB - Feb 22, 1951, PVV:[id number]  Admit date - 10/04/2017   Admitting Physician Lady Deutscher, MD  Outpatient Primary MD for the patient is Rogers Blocker, MD  LOS - 2   Chief Complaint  Patient presents with  . Shortness of Breath        Subjective:    Matthew Wood today has no fevers, no emesis,  No chest pain, cough and dyspnea on exertion is improving, no further fevers,  Assessment  & Plan :    Principal Problem:   Acute respiratory failure (HCC) Active Problems:   CAD (coronary artery disease), native coronary artery   Sleep apnea   Hyperlipidemia   Chronic systolic heart failure (HCC)   CKD (chronic kidney disease), stage IV (HCC)   Right upper lobe pneumonia (HCC)   Cardiorenal syndrome with renal failure   Elevated troponin   Arthritis of right knee   Hematuria   Type 2 diabetes mellitus with nephropathy (HCC)   1)Acute hypoxic respiratory failure secondary to right-sided pneumonia-   continue to wean off oxygen ,treat pneumonia as noted below, overall improving, coughing less frequently cough is less productive and dyspnea on exertion is improving no shortness of breath at rest, WBC trended down patient remains afebrile please note that on admission patient's temperature was as high as 101.8 also on admission patient's white count was over 18 K  2) right upper lobe pneumonia-community-acquired with hypoxia, continue doxycycline and  Rocephin pending cultures, continue bronchodilators, mucolytic's, continues to improve with weaning of O2, repeat chest x-ray noted  3) sepsis secondary to #2 above--on admission patient met sepsis criteria with fever (101.8), leukocytosis with white count of 18k , tachypnea and and hypoxia with acute respiratory failure, tachycardia response may have been blunted by high-dose Coreg, continue  doxycycline and Rocephin pending cultures as above in # 1 and  #2  4)HFrEF-last known EF in the 35% range, patient has AICD in situ, continue benazepril 40 mg daily, Coreg 25 mg twice daily, Lasix 80 mg daily, c/n Bidil and crestor, BNP less than 600, chest x-ray without volume overload/CHF flareup,   5)DM-A1c is 9.0, needs to be more compliant with diabetic regimen emphasized to patient, restart Lantus at 40 units nightly along with NovoLog 3 units 3 times daily with meals and sliding scale insulin, also restart Amaryl 4 mg twice daily with meals  6)CKD -stage IV- V-renal function appears to be close to baseline, with creatinine above 4, avoid nephrotoxic agents, monitor carefully, patient is voiding well  7)HTN- stage 2-BP control has improved, continue clonidine 0.1 mg daily along with 0.2 mg nightly, amlodipine 10 mg daily,  Code Status : full   Disposition Plan  : home  Consults  :  none  DVT Prophylaxis  :  Lovenox    Lab Results  Component Value Date   PLT 148 (L) 10/06/2017    Inpatient Medications  Scheduled Meds: . allopurinol  300 mg Oral Daily  . amLODipine  10 mg Oral Daily  . aspirin EC  325 mg Oral QHS  . benazepril  40 mg Oral Daily  . carvedilol  25 mg Oral BID WC  . cloNIDine  0.1 mg Oral Daily  . cloNIDine  0.2 mg Oral QHS  . doxycycline  100 mg Oral Q12H  . enoxaparin (LOVENOX) injection  40 mg Subcutaneous Q24H  . furosemide  80 mg Oral Daily  . [START ON 10/07/2017] glimepiride  4 mg Oral BID WC  . guaiFENesin  1,200 mg Oral BID  . insulin aspart  0-9 Units Subcutaneous TID WC  . insulin aspart  3 Units Subcutaneous TID WC  . insulin glargine  40 Units Subcutaneous QHS  . isosorbide-hydrALAZINE  1 tablet Oral BID  . potassium chloride  10 mEq Oral BID  . rosuvastatin  20 mg Oral q1800  . triamcinolone  1 spray Nasal Daily   Continuous Infusions: . cefTRIAXone (ROCEPHIN)  IV 1 g (10/06/17 1954)   PRN Meds:.    Anti-infectives (From admission,  onward)   Start     Dose/Rate Route Frequency Ordered Stop   10/05/17 1700  cefTRIAXone (ROCEPHIN) 1 g in sodium chloride 0.9 % 100 mL IVPB  Status:  Discontinued     1 g 200 mL/hr over 30 Minutes Intravenous Every 24 hours 10/04/17 2002 10/04/17 2022   10/05/17 1700  cefTRIAXone (ROCEPHIN) 1 g in sodium chloride 0.9 % 100 mL IVPB     1 g 200 mL/hr over 30 Minutes Intravenous Every 24 hours 10/04/17 2022 10/11/17 1659   10/05/17 1000  doxycycline (VIBRA-TABS) tablet 100 mg     100 mg Oral Every 12 hours 10/04/17 2002 10/11/17 0959   10/04/17 1615  cefTRIAXone (ROCEPHIN) 1 g in sodium chloride 0.9 % 100 mL IVPB     1 g 200 mL/hr over 30 Minutes Intravenous  Once 10/04/17 1607 10/04/17 1700   10/04/17 1615  doxycycline (VIBRA-TABS) tablet 100 mg     100 mg Oral  Once 10/04/17 1607 10/04/17 1649        Objective:   Vitals:   10/06/17 1054 10/06/17 1235 10/06/17 1240 10/06/17 1922  BP: 132/67 131/68  137/70  Pulse: 67 65 68 77  Resp: (!) _0 Temp: 98.3 F (36.8 C) 98.2 F (36.8 C)  98.8 F (37.1 C)  TempSrc: Oral Oral  Oral  SpO2: 97% 94% 96% 95%  Weight:      Height:        Wt Readings from Last 3 Encounters:  10/06/17 (!) 137.7 kg (303 lb 9.6 oz)  09/06/17 136.1 kg (300 lb)  08/30/17 135.6 kg (299 lb)     Intake/Output Summary (Last 24 hours) at 10/06/2017 2013 Last data filed at 10/06/2017 1914 Gross per 24 hour  Intake 1300 ml  Output 650 ml  Net 650 ml     Physical Exam  Gen:- Awake Alert, able to speak in sentences,  improving dyspnea on exertion  HEENT:- [place].AT, No sclera icterus Neck-Supple Neck,No JVD,.  Lungs-improving air movement, scattered rhonchi on the right  CV- S1, S2 normal, AICD in situ Abd-  +ve B.Sounds, Abd Soft, No tenderness,    Extremity/Skin:-Trace edema, good pulses psych-affect is appropriate, oriented x3 Neuro-no new focal deficits, no tremors   Data Review:   Micro Results Recent Results (from the past 240 hour(s))    Blood Culture (routine x 2)     Status: None (Preliminary result)   Collection Time: 10/04/17  4:25 PM  Result Value Ref Range Status   Specimen Description BLOOD LEFT ANTECUBITAL  Final   Special Requests   Final    BOTTLES DRAWN AEROBIC AND ANAEROBIC Blood  Culture adequate volume   Culture   Final    NO GROWTH 2 DAYS Performed at [facility], [place] [address], [place], [place] [address]    Report Status PENDING  Incomplete  Blood Culture (routine x 2)     Status: None (Preliminary result)   Collection Time: 10/04/17  4:27 PM  Result Value Ref Range Status   Specimen Description BLOOD RIGHT ANTECUBITAL  Final   Special Requests   Final    BOTTLES DRAWN AEROBIC AND ANAEROBIC Blood Culture adequate volume   Culture   Final    NO GROWTH 2 DAYS Performed at [facility], [place] [address], [place], [place] [address]    Report Status PENDING  Incomplete  Urine culture     Status: Abnormal   Collection Time: 10/04/17  7:48 PM  Result Value Ref Range Status   Specimen Description URINE, CLEAN CATCH  Final   Special Requests NONE  Final   Culture (A)  Final    <10,000 COLONIES/mL INSIGNIFICANT GROWTH Performed at [facility], [place] [address] [place],  [address]    Report Status 10/06/2017 FINAL  Final    Radiology Reports Dg Chest 2 View  Result Date: 10/06/2017 CLINICAL DATA:  Sob/ona since Monday,,hx chf EXAM: CHEST - 2 VIEW COMPARISON:  10/04/2017 FINDINGS: Persistent airspace consolidation in the right upper lobe. Left lung clear. Stable left subclavian AICD. Mild cardiomegaly stable. Blunting of posterior costophrenic angles suggesting small effusions. No pneumothorax. Anterior vertebral endplate spurring at multiple levels in the lower thoracic spine. IMPRESSION: Little change in right upper lobe consolidation presumed pneumonia. Electronically Signed   By: Lucrezia Europe M.D.   On: 10/06/2017 10:22   Dg Chest Port 1 View  Result Date: 10/04/2017 CLINICAL  DATA:  Cough and fevers EXAM: PORTABLE CHEST 1 VIEW COMPARISON:  05/26/2016 FINDINGS: Cardiac shadow is stable with defibrillator again noted in satisfactory position. Lungs are well aerated bilaterally. Increased density is noted in the right upper lobe along the minor fissure in the right suprahilar region consistent with acute infiltrate. No other focal infiltrate is seen. No acute bony abnormality is noted. IMPRESSION: Changes in the right upper lobe consistent with acute infiltrate. Followup PA and lateral chest X-ray is recommended in 3-4 weeks following trial of antibiotic therapy to ensure resolution and exclude underlying malignancy. Electronically Signed   By: Inez Catalina M.D.   On: 10/04/2017 16:33     CBC Recent Labs  Lab 10/04/17 1740 10/05/17 0535 10/06/17 0402  WBC 18.1* 16.9* 14.2*  HGB 9.0* 8.5* 8.3*  HCT 28.9* 27.0* 25.9*  PLT 162 169 148*  MCV 83.0 83.3 83.3  MCH 25.9* 26.2 26.7  MCHC 31.1 31.5 32.0  RDW 17.6* 17.5* 18.0*  LYMPHSABS 1.9  --   --   MONOABS 1.5*  --   --   EOSABS 0.1  --   --   BASOSABS 0.0  --   --     Chemistries  Recent Labs  Lab 10/04/17 1627 10/04/17 2103 10/05/17 0535 10/06/17 0402  NA 138  --  138 137  K 4.6  --  4.2 4.1  CL 111  --  112* 111  CO2 16*  --  18* 17*  GLUCOSE 231*  --  150* 84  BUN 60*  --  56* 60*  CREATININE 4.20* 4.16* 4.07* 4.32*  CALCIUM 10.0  --  9.6 9.3  AST 22  --   --   --  ALT 19  --   --   --   ALKPHOS 92  --   --   --   BILITOT 0.9  --   --   --    ------------------------------------------------------------------------------------------------------------------ No results for input(s): CHOL, HDL, LDLCALC, TRIG, CHOLHDL, LDLDIRECT in the last 72 hours.  Lab Results  Component Value Date   HGBA1C 9.0 (H) 10/04/2017   ------------------------------------------------------------------------------------------------------------------ No results for input(s): TSH, T4TOTAL, T3FREE, THYROIDAB in the last 72  hours.  Invalid input(s): FREET3 ------------------------------------------------------------------------------------------------------------------ No results for input(s): VITAMINB12, FOLATE, FERRITIN, TIBC, IRON, RETICCTPCT in the last 72 hours.  Coagulation profile No results for input(s): INR, PROTIME in the last 168 hours.  No results for input(s): DDIMER in the last 72 hours.  Cardiac Enzymes Recent Labs  Lab 10/05/17 0535 10/05/17 1427 10/05/17 2029  TROPONINI 0.12* 0.08* 0.08*   ------------------------------------------------------------------------------------------------------------------    Component Value Date/Time   BNP 578.5 (H) 10/04/2017 1627   BNP 274.2 (H) 02/05/2016 1442     Mina Carlisi M.D on 10/06/2017 at 8:13 PM  Between 7am to 7pm - Pager - [phone]  After 7pm go to [email or web address] - password [email or web address]  [facility] -  Office  [phone]   Voice Recognition Viviann Spare dictation system was used to create this note, attempts have been made to correct errors. Please contact the author with questions and/or clarifications.

## 2017-10-06 NOTE — Progress Notes (Addendum)
Results for KALADIN, NOSEWORTHY (MRN [id number]) as of 10/06/2017 11:13  Ref. Range 10/05/2017 12:31 10/05/2017 17:10 10/05/2017 21:13 10/06/2017 07:43 10/06/2017 08:00  Glucose-Capillary Latest Ref Range: 65 - 99 mg/dL 84 132 (H) 139 (H) 40 (LL) 58 (L)  Noted that patient had a low blood sugar of 40 mg/dl this am. Recommend decreasing Lantus to 35 units every HS and discontinuing the Amaryl or decrease dosage.    Harvel Ricks RN BSN CDE Diabetes Coordinator Pager: [phone]  8am-5pm

## 2017-10-07 LAB — CBC
HCT: 28.4 % — ABNORMAL LOW (ref 39.0–52.0)
HEMOGLOBIN: 9.3 g/dL — AB (ref 13.0–17.0)
MCH: 27.3 pg (ref 26.0–34.0)
MCHC: 32.7 g/dL (ref 30.0–36.0)
MCV: 83.3 fL (ref 78.0–100.0)
PLATELETS: 176 10*3/uL (ref 150–400)
RBC: 3.41 MIL/uL — AB (ref 4.22–5.81)
RDW: 17.9 % — ABNORMAL HIGH (ref 11.5–15.5)
WBC: 12.2 10*3/uL — ABNORMAL HIGH (ref 4.0–10.5)

## 2017-10-07 LAB — BASIC METABOLIC PANEL
Anion gap: 13 (ref 5–15)
BUN: 66 mg/dL — AB (ref 6–20)
CHLORIDE: 111 mmol/L (ref 101–111)
CO2: 14 mmol/L — ABNORMAL LOW (ref 22–32)
CREATININE: 4.23 mg/dL — AB (ref 0.61–1.24)
Calcium: 9.8 mg/dL (ref 8.9–10.3)
GFR calc Af Amer: 16 mL/min — ABNORMAL LOW (ref >60)
GFR calc non Af Amer: 14 mL/min — ABNORMAL LOW (ref >60)
GLUCOSE: 97 mg/dL (ref 65–99)
POTASSIUM: 4.9 mmol/L (ref 3.5–5.1)
Sodium: 138 mmol/L (ref 135–145)

## 2017-10-07 LAB — GLUCOSE, CAPILLARY
GLUCOSE-CAPILLARY: 94 mg/dL (ref 65–99)
Glucose-Capillary: 110 mg/dL — ABNORMAL HIGH (ref 65–99)

## 2017-10-07 MED ORDER — ALBUTEROL SULFATE 0.63 MG/3ML IN NEBU: 1.0000 | INHALATION_SOLUTION | Freq: Four times a day (QID) | RESPIRATORY_TRACT | 12 refills | Status: DC | PRN

## 2017-10-07 MED ORDER — FUROSEMIDE 40 MG PO TABS: 80.0000 mg | ORAL_TABLET | Freq: Every day | ORAL | 3 refills | Status: DC

## 2017-10-07 MED ORDER — GUAIFENESIN ER 600 MG PO TB12: 600.0000 mg | ORAL_TABLET | Freq: Two times a day (BID) | ORAL | 0 refills | Status: AC

## 2017-10-07 MED ORDER — METOLAZONE 2.5 MG PO TABS: ORAL_TABLET | ORAL | 2 refills | Status: DC

## 2017-10-07 MED ORDER — DOXYCYCLINE HYCLATE 100 MG PO TABS: 100.0000 mg | ORAL_TABLET | Freq: Two times a day (BID) | ORAL | 0 refills | Status: DC

## 2017-10-07 MED ORDER — CEFDINIR 300 MG PO CAPS: 300.0000 mg | ORAL_CAPSULE | Freq: Two times a day (BID) | ORAL | 0 refills | Status: AC

## 2017-10-07 MED ORDER — ISOSORB DINITRATE-HYDRALAZINE 20-37.5 MG PO TABS: 1.0000 | ORAL_TABLET | Freq: Two times a day (BID) | ORAL | 4 refills | Status: DC

## 2017-10-07 NOTE — Progress Notes (Signed)
Physical Therapy Evaluation Patient Details Name: Matthew Wood MRN: [id number] DOB: [birth year] Today's Date: 10/07/2017   History of Present Illness  Matthew Wood is a 67 y/o male admitted on 10/04/17 due to shortness of breath dx with acute respiratory failure and pneumonia. Patient with a PMH significant for CAD, CHF with EF of 35%, CVA, OSA with CPAP, HTN, and DM2.   Clinical Impression  Matthew Wood is a pleasant 67 y/o male admitted with the above listed diagnosis. PTA, patient and wife report he was independent with all ADLs and functional mobility. Patient today able to transfer and ambulate with SUP/Min Guard for general safety. Does demonstrate reliance on RW with forward trunk lean during ambulation - has RW at home. PT identifying no other acute needs. PT to sign off.     Follow Up Recommendations No PT follow up    Equipment Recommendations  (tub bench)    Recommendations for Other Services       Precautions / Restrictions Precautions Precautions: Fall Restrictions Weight Bearing Restrictions: No      Mobility  Bed Mobility               General bed mobility comments: patient in recliner upon PT arrival  Transfers Overall transfer level: Needs assistance Equipment used: Rolling walker (2 wheeled) Transfers: Sit to/from Stand Sit to Stand: Min guard;Supervision         General transfer comment: for general safety and immediate standing balance  Ambulation/Gait Ambulation/Gait assistance: Min guard;Supervision Ambulation Distance (Feet): 200 Feet Assistive device: Rolling walker (2 wheeled) Gait Pattern/deviations: Step-through pattern;Decreased stride length;Trunk flexed Gait velocity: decreased   General Gait Details: required 1 standing rest break - O2 sat remaining at or above 93% on RA  Stairs            Wheelchair Mobility    Modified Rankin (Stroke Patients Only)       Balance Overall balance assessment: Needs  assistance Sitting-balance support: Feet supported Sitting balance-Leahy Scale: Good     Standing balance support: Bilateral upper extremity supported;During functional activity Standing balance-Leahy Scale: Good Standing balance comment: reliant on RW                             Pertinent Vitals/Pain Pain Assessment: No/denies pain    Home Living Family/patient expects to be discharged to:: Private residence Living Arrangements: Spouse/significant other Available Help at Discharge: Family Type of Home: House Home Access: Stairs to enter   Technical brewer of Steps: 4 Home Layout: Two level;Bed/bath upstairs   Additional Comments: patient reports sleeping in recliner on first floor but needing to ascend steps for bathing    Prior Function Level of Independence: Independent               Hand Dominance        Extremity/Trunk Assessment   Upper Extremity Assessment Upper Extremity Assessment: Defer to OT evaluation    Lower Extremity Assessment Lower Extremity Assessment: Overall WFL for tasks assessed    Cervical / Trunk Assessment Cervical / Trunk Assessment: Kyphotic  Communication   Communication: No difficulties  Cognition Arousal/Alertness: Awake/alert Behavior During Therapy: WFL for tasks assessed/performed Overall Cognitive Status: Within Functional Limits for tasks assessed  General Comments      Exercises     Assessment/Plan    PT Assessment Patent does not need any further PT services  PT Problem List         PT Treatment Interventions      PT Goals (Current goals can be found in the Care Plan section)  Acute Rehab PT Goals Patient Stated Goal: return home PT Goal Formulation: With patient Time For Goal Achievement: 10/14/17 Potential to Achieve Goals: Good    Frequency     Barriers to discharge        Co-evaluation               AM-PAC PT "6  Clicks" Daily Activity  Outcome Measure Difficulty turning over in bed (including adjusting bedclothes, sheets and blankets)?: A Little Difficulty moving from lying on back to sitting on the side of the bed? : A Little Difficulty sitting down on and standing up from a chair with arms (e.g., wheelchair, bedside commode, etc,.)?: A Little Help needed moving to and from a bed to chair (including a wheelchair)?: A Little Help needed walking in hospital room?: A Little Help needed climbing 3-5 steps with a railing? : A Little 6 Click Score: 18    End of Session Equipment Utilized During Treatment: Gait belt Activity Tolerance: Patient tolerated treatment well Patient left: in chair;with call bell/phone within reach;with family/visitor present Nurse Communication: Mobility status PT Visit Diagnosis: Other abnormalities of gait and mobility (R26.89);Muscle weakness (generalized) (M62.81);Difficulty in walking, not elsewhere classified (R26.2)    Time: 9597-4718 PT Time Calculation (min) (ACUTE ONLY): 23 min   Charges:   PT Evaluation $PT Eval Low Complexity: 1 Low PT Treatments $Gait Training: 8-22 mins   Lanney Gins, PT, DPT 10/07/17 3:05 PM

## 2017-10-07 NOTE — Progress Notes (Signed)
Pt ambulated in a hallway. Oxygen saturation maintained while walking at 95-96%, but pt sounds wet and wheezing still present and pt felt SOB too although sat is maintained, will let MD know

## 2017-10-07 NOTE — Discharge Instructions (Signed)
1) low-salt and Diabetic diet advised 2) avoid drinking too much fluids 3) follow-up with your kidney doctor Dr. Marval Regal in about a week for recheck 4) take doxycycline and Omnicef antibiotics as prescribed starting 10/08/17 , use nebulizer treatments as prescribed for pneumonia starting 10/07/17 5) keep your appointment with your cardiologist for follow-up on your heart failure 6) use  CPAP machine every time you go to sleep

## 2017-10-07 NOTE — Discharge Summary (Signed)
Matthew Wood, is a 67 y.o. male  DOB 1950/06/26  MRN [id number].  Admission date:  10/04/2017  Admitting Physician  Lady Deutscher, MD  Discharge Date:  10/07/2017   Primary MD  Rogers Blocker, MD  Recommendations for primary care physician for things to follow:   1)Low-salt and Diabetic diet advised 2) avoid drinking too much fluids 3) follow-up with your kidney doctor Dr. Marval Regal in about a week for recheck 4) take doxycycline and Omnicef antibiotics as prescribed starting 10/08/17 , use nebulizer treatments as prescribed for pneumonia starting 10/07/17 5) keep your appointment with your cardiologist for follow-up on your heart failure 6) use  CPAP machine every time you go to sleep   Admission Diagnosis  Hypoxia [R09.02] PNA (pneumonia) [J18.9] Community acquired pneumonia of right upper lobe of lung ([place]) [J18.1]  Discharge Diagnosis  Hypoxia [R09.02] PNA (pneumonia) [J18.9] Community acquired pneumonia of right upper lobe of lung ([place]) [J18.1]    Principal Problem:   Acute respiratory failure ([place]) Active Problems:   CAD (coronary artery disease), native coronary artery   Sleep apnea   Hyperlipidemia   Chronic systolic heart failure ([place])   CKD (chronic kidney disease), stage IV ([place])   Right upper lobe pneumonia ([place])   Cardiorenal syndrome with renal failure   Elevated troponin   Arthritis of right knee   Hematuria   Type 2 diabetes mellitus with nephropathy ([place])      Past Medical History:  Diagnosis Date  . Anemia   . CHF (congestive heart failure) ([place])   . Coronary artery disease    a. LHC 04/27/10 left main patent, 30-40% LAD, mid circumflex 30%, 80% mid RCA accounting for the appearance of an infract/peri-infract ischemia on nuclear testing, LV EF of 30-45%--> medical therapy  . CVA (cerebrovascular accident) ([place]) 2011   potine; "little balance problems since"  (05/25/2016)  . Dyspnea   . Gout   . History of kidney stones    "passed them" (05/25/2016)  . Hyperlipidemia   . Hypertension   . LBBB (left bundle branch block)   . Multinodular goiter   . Obesity   . OSA on CPAP   . Presence of permanent cardiac pacemaker   . Renal insufficiency   . Type II diabetes mellitus ([place])     Past Surgical History:  Procedure Laterality Date  . COLONOSCOPY WITH PROPOFOL N/A 04/16/2014   Procedure: COLONOSCOPY WITH PROPOFOL;  Surgeon: [name], MD;  Location: [facility];  Service: Endoscopy;  Laterality: N/A;  . EP IMPLANTABLE DEVICE N/A 05/25/2016   Procedure: BiV ICD Insertion CRT-D;  Surgeon: [name], MD;  Location: [facility];  Service: Cardiovascular;  Laterality: N/A;  . INSERT / REPLACE / REMOVE PACEMAKER  05/25/2016   biventricular pacemaker  . LAPAROSCOPIC CHOLECYSTECTOMY         HPI  from the history and physical done on the day of admission:     Chief Complaint: Shortness of breath with history of congestive heart  failure  HPI: Matthew Wood is a 67 y.o. male with medical history significant for coronary artery disease, congestive heart failure with ejection fraction of 35% last echo in 2017, stroke, obstructive sleep apnea uses CPAP at night, hypertension moderately well controlled, and type 2 diabetes who presents with cough and shortness of breath.  2 to 3 days prior to presentation the patient has complained of productive cough associated with nasal congestion, sore throat, and intermittent fevers.  His wife has been giving him Tylenol his last dose just prior to arrival to our emergency department.  Patient states that his sputum is green and he has had progressively worsening shortness of breath.  He has had stable lower extremity edema with no significant change in his congestive heart failure symptoms.  He denies requiring increased orthopnea but he sleeps every night in a recliner with his CPAP machine  on.  He has been compliant with his medications his son has been recently ill but denies any chest pains.  He has had no PND and no increasing lower extremity edema or abdominal girth. Patient states he would not have come into the emergency department tonight had it not been for his wife who felt that he was getting sicker and sicker and required an evaluation.  The history is provided bythe patient.    ED Course: Fever of 100.8 when I saw him it was up to 101.8. Blood glucose 231 Creatinine 4.2 (stable) Chest x-ray right upper lobe infiltrate BNP 578    Hospital Course:    1)Acute hypoxic respiratory failure secondary to right-sided pneumonia-   much improved overall, patient has been weaned off oxygen, post ambulation O2 sats today 95 to 96 % on room air, afebrile, white count is down to 12 K from 18 K on admission, pneumonia was treated with doxycycline, Rocephin bronchodilators and mucolytics overall much improved, some degree of dyspnea on exertion persist but this is not far different from patient's baseline, at baseline patient has chronic systolic dysfunction CHF with EF of only 35%.  Repeat chest x-ray from 10/06/2017 noted  2)Right upper lobe pneumonia-community-acquired with hypoxia-----hypoxia has resolved, see #1 above, overall much improved, discharged home on doxycycline and Omnicef,   continue bronchodilators, mucolytic's, please note that on admission patient's temperature was as high as 101.8 also on admission patient's white count was over 18 K  3)Sepsis secondary to #2 above--on admission patient met sepsis criteria with fever (101.8), leukocytosis with white count of 18k , tachypnea and and hypoxia with acute respiratory failure, tachycardia response may have been blunted by high-dose Coreg.  Hypoxia and sepsis physiology has resolved  4)HFrEF-last known EF in the 35% range, patient has AICD in situ, continue benazepril 40 mg daily, Coreg 25 mg twice daily, Lasix 80  mg daily, c/n Bidil and crestor, BNP less than 600, chest x-ray without volume overload/CHF flareup,  he is voiding well, even at baseline patient has some degree of dyspnea on exertion which persist at this time, post ambulation O2 sat 95 to 96% on room air  5)DM-A1c is 9.0, needs to be more compliant with diabetic regimen emphasized to patient, restart insulin regimen, also restart Amaryl 4 mg twice daily with meals  6)CKD -stage IV- V-renal function appears to be close to baseline, with creatinine above 4, avoid nephrotoxic agents, monitor carefully, patient is voiding well  7)HTN- stage 2-BP control has improved, continue clonidine 0.1 mg daily along with 0.2 mg nightly, amlodipine 10 mg daily,  Code Status :  full   Disposition Plan  : home  Discharge Condition: stable  Follow UP-with nephrologist and cardiologist as outpatient   Diet and Activity recommendation:  As advised  Discharge Instructions    Discharge Instructions    ([place]) Call MD:  Anytime you have any of the following symptoms: 1) 3 pound weight gain in 24 hours or 5 pounds in 1 week 2) shortness of breath, with or without a dry hacking cough 3) swelling in the hands, feet or stomach 4) if you have to sleep on extra pillows at night in order to breathe.   Complete by:  As directed    Call MD for:  difficulty breathing, headache or visual disturbances   Complete by:  As directed    Call MD for:  persistant dizziness or light-headedness   Complete by:  As directed    Call MD for:  persistant nausea and vomiting   Complete by:  As directed    Call MD for:  temperature >100.4   Complete by:  As directed    DME Nebulizer machine   Complete by:  As directed    Pneumonia   Patient needs a nebulizer to treat with the following condition:  CHF (congestive heart failure) ([place])   DME Nebulizer/meds   Complete by:  As directed    Patient needs a nebulizer to treat with the following condition:   Dyspnea and respiratory abnormalities   Diet - low sodium heart healthy   Complete by:  As directed    Discharge instructions   Complete by:  As directed    1) low-salt and Diabetic diet advised 2) avoid drinking too much fluids 3) follow-up with your kidney doctor Dr. Marval Regal in about a week for recheck 4) take doxycycline and Omnicef antibiotics as prescribed starting 10/08/17 , use nebulizer treatments as prescribed for pneumonia starting 10/07/17 5) keep your appointment with your cardiologist for follow-up on your heart failure 6) use  CPAP machine every time you go to sleep   Increase activity slowly   Complete by:  As directed         Discharge Medications     Allergies as of 10/07/2017   No Known Allergies     Medication List    TAKE these medications   acetaminophen 500 MG tablet Commonly known as:  TYLENOL Take 500-1,000 mg by mouth every 8 (eight) hours as needed (for pain).   albuterol 0.63 MG/3ML nebulizer solution Commonly known as:  ACCUNEB Take 3 mLs (0.63 mg total) by nebulization every 6 (six) hours as needed for wheezing.   allopurinol 300 MG tablet Commonly known as:  ZYLOPRIM Take 300 mg by mouth daily.   amLODipine-benazepril 10-40 MG capsule Commonly known as:  LOTREL Take 1 capsule by mouth every morning.   aspirin EC 325 MG tablet Take 325 mg by mouth at bedtime.   BASAGLAR KWIKPEN 100 UNIT/ML Sopn Inject 40 Units into the skin at bedtime.   carvedilol 25 MG tablet Commonly known as:  COREG Take 25 mg by mouth 2 (two) times daily with a meal.   cefdinir 300 MG capsule Commonly known as:  OMNICEF Take 1 capsule (300 mg total) by mouth 2 (two) times daily for 7 days.   cloNIDine 0.2 MG tablet Commonly known as:  CATAPRES Take 0.1-0.2 mg by mouth See admin instructions. Take 0.1 mg by mouth in the morning and 0.2 mg in the evening   doxycycline 100 MG tablet Commonly known as:  VIBRA-TABS Take 1 tablet (100 mg total) by mouth 2  (two) times daily. For Pneumonia   furosemide 40 MG tablet Commonly known as:  LASIX Take 2 tablets (80 mg total) by mouth daily.   glimepiride 4 MG tablet Commonly known as:  AMARYL Take 4 mg by mouth 2 (two) times daily.   guaiFENesin 600 MG 12 hr tablet Commonly known as:  MUCINEX Take 1 tablet (600 mg total) by mouth 2 (two) times daily for 10 days.   HUMALOG KWIKPEN 100 UNIT/ML KiwkPen Generic drug:  insulin lispro Inject 20 Units into the skin daily after breakfast.   isosorbide-hydrALAZINE 20-37.5 MG tablet Commonly known as:  BIDIL Take 1 tablet by mouth 2 (two) times daily.   KLOR-CON M10 10 MEQ tablet Generic drug:  potassium chloride TAKE 1 TABLET (10 MEQ TOTAL) BY MOUTH 2 (TWO) TIMES DAILY.   metolazone 2.5 MG tablet Commonly known as:  ZAROXOLYN Take 1 tablet by mouth once weekly if you gain 3-4 pounds in a day   NASACORT ALLERGY 24HR 55 MCG/ACT Aero nasal inhaler Generic drug:  triamcinolone Place 1 spray into the nose daily.   NON FORMULARY CPAP: At bedtime   rosuvastatin 20 MG tablet Commonly known as:  CRESTOR Take 20 mg by mouth daily.            Durable Medical Equipment  (From admission, onward)        Start     Ordered   10/07/17 0000  DME Nebulizer machine    Comments:  Pneumonia  Question:  Patient needs a nebulizer to treat with the following condition  Answer:  CHF (congestive heart failure) ([place])   10/07/17 1251   10/07/17 0000  DME Nebulizer/meds    Question:  Patient needs a nebulizer to treat with the following condition  Answer:  Dyspnea and respiratory abnormalities   10/07/17 1251      Major procedures and Radiology Reports - PLEASE review detailed and final reports for all details, in brief -    Dg Chest 2 View  Result Date: 10/06/2017 CLINICAL DATA:  Sob/ona since Monday,,hx chf EXAM: CHEST - 2 VIEW COMPARISON:  10/04/2017 FINDINGS: Persistent airspace consolidation in the right upper lobe. Left lung clear. Stable  left subclavian AICD. Mild cardiomegaly stable. Blunting of posterior costophrenic angles suggesting small effusions. No pneumothorax. Anterior vertebral endplate spurring at multiple levels in the lower thoracic spine. IMPRESSION: Little change in right upper lobe consolidation presumed pneumonia. Electronically Signed   By: Lucrezia Europe M.D.   On: 10/06/2017 10:22   Dg Chest Port 1 View  Result Date: 10/04/2017 CLINICAL DATA:  Cough and fevers EXAM: PORTABLE CHEST 1 VIEW COMPARISON:  05/26/2016 FINDINGS: Cardiac shadow is stable with defibrillator again noted in satisfactory position. Lungs are well aerated bilaterally. Increased density is noted in the right upper lobe along the minor fissure in the right suprahilar region consistent with acute infiltrate. No other focal infiltrate is seen. No acute bony abnormality is noted. IMPRESSION: Changes in the right upper lobe consistent with acute infiltrate. Followup PA and lateral chest X-ray is recommended in 3-4 weeks following trial of antibiotic therapy to ensure resolution and exclude underlying malignancy. Electronically Signed   By: Inez Catalina M.D.   On: 10/04/2017 16:33    Micro Results    Recent Results (from the past 240 hour(s))  Blood Culture (routine x 2)     Status: None (Preliminary result)   Collection Time: 10/04/17  4:25 PM  Result Value Ref Range Status   Specimen Description BLOOD LEFT ANTECUBITAL  Final   Special Requests   Final    BOTTLES DRAWN AEROBIC AND ANAEROBIC Blood Culture adequate volume   Culture   Final    NO GROWTH 3 DAYS Performed at [facility], [address], [place], [place] [address]    Report Status PENDING  Incomplete  Blood Culture (routine x 2)     Status: None (Preliminary result)   Collection Time: 10/04/17  4:27 PM  Result Value Ref Range Status   Specimen Description BLOOD RIGHT ANTECUBITAL  Final   Special Requests   Final    BOTTLES DRAWN AEROBIC AND ANAEROBIC Blood Culture adequate  volume   Culture   Final    NO GROWTH 3 DAYS Performed at [facility], [place] [address] [place], [place] [address]    Report Status PENDING  Incomplete  Urine culture     Status: Abnormal   Collection Time: 10/04/17  7:48 PM  Result Value Ref Range Status   Specimen Description URINE, CLEAN CATCH  Final   Special Requests NONE  Final   Culture (A)  Final    <10,000 COLONIES/mL INSIGNIFICANT GROWTH Performed at [facility], [place] [address], [place], [place] [address]    Report Status 10/06/2017 FINAL  Final       Today   Subjective    Matthew Wood today has no new complaints, ambulated with RN, post ablation O2 sats 95 to 96% on room air, at rest patient is speaking in complete sentences, patient does have some dyspnea on exertion with activity/ambulation,  afebrile no vomiting or diarrhea, voiding well          Patient has been seen and examined prior to discharge   Objective   Blood pressure 133/62, pulse 65, temperature 98.7 F (37.1 C), temperature source Oral, resp. rate 18, height '5\' 11"'  (1.803 m), weight (!) 137.1 kg (302 lb 3.2 oz), SpO2 94 %.   Intake/Output Summary (Last 24 hours) at 10/07/2017 1300 Last data filed at 10/07/2017 1136 Gross per 24 hour  Intake 1180 ml  Output 1335 ml  Net -155 ml    Exam Gen:- Awake Alert, able to speak in sentences,  HEENT:- [place].AT, No sclera icterus Neck-Supple Neck,No JVD,.  Lungs-improved air movement,  improved adventitial sounds overall, patient does have occasional episodes of wheezing  CV- S1, S2 normal, AICD in situ Abd-  +ve B.Sounds, Abd Soft, No tenderness,    Extremity/Skin:-Trace edema, good pulses psych-affect is appropriate, oriented x3 Neuro-no new focal deficits, no tremors     Data Review   CBC w Diff:  Lab Results  Component Value Date   WBC 12.2 (H) 10/07/2017   HGB 9.3 (L) 10/07/2017   HGB 10.4 (L) 12/02/2016   HCT 28.4 (L) 10/07/2017   HCT 32.7 (L) 12/02/2016   PLT 176  10/07/2017   PLT 209 12/02/2016   LYMPHOPCT 11 10/04/2017   MONOPCT 8 10/04/2017   EOSPCT 0 10/04/2017   BASOPCT 0 10/04/2017    CMP:  Lab Results  Component Value Date   NA 138 10/07/2017   NA 142 09/06/2017   K 4.9 10/07/2017   CL 111 10/07/2017   CO2 14 (L) 10/07/2017   BUN 66 (H) 10/07/2017   BUN 92 (HH) 09/06/2017   CREATININE 4.23 (H) 10/07/2017   CREATININE 3.21 (H) 05/21/2016   GLU 332 02/03/2012   PROT 7.6 10/04/2017  ALBUMIN 3.5 10/04/2017   BILITOT 0.9 10/04/2017   ALKPHOS 92 10/04/2017   AST 22 10/04/2017   ALT 19 10/04/2017  .   Total Discharge time is about 33 minutes  Roxan Hockey M.D on 10/07/2017 at 1:00 PM  [facility]   Office  [phone]  Voice Recognition Viviann Spare dictation system was used to create this note, attempts have been made to correct errors. Please contact the author with questions and/or clarifications.

## 2017-10-07 NOTE — Progress Notes (Signed)
Pt got discharged to home, discharge instructions provided and patient showed understanding to it, IV taken out,Telemonitor DC,pt left unit in wheelchair with all of the belongings accompanied with a family member (wife)  Kristyna Bradstreet,RN 

## 2017-10-07 NOTE — Progress Notes (Signed)
Results for SETH, HIGGINBOTHAM (MRN [id number]) as of 10/07/2017 12:02  Ref. Range 10/06/2017 11:23 10/06/2017 16:41 10/06/2017 21:06 10/07/2017 07:52 10/07/2017 11:33  Glucose-Capillary Latest Ref Range: 65 - 99 mg/dL 75 150 (H) 150 (H) 94 110 (H)  Noted that blood sugars have been less than 100 mg/dl. Noted that patient is on home dose of Lantus 40 units daily, Novolog SENSITIVE correction scale TID, and Novolog 3 units TID with meals if patient eats at lest 50 % of meal. Recommend discontinuing Amaryl especially while in the hospital due to low blood sugars.  Will need to follow up with PCP for updating  diabetes medications and dosages at discharge.   Harvel Ricks RN BSN CDE Diabetes Coordinator Pager: [phone]  8am-5pm

## 2017-10-09 LAB — CULTURE, BLOOD (ROUTINE X 2)
Culture: NO GROWTH
Culture: NO GROWTH
Special Requests: ADEQUATE
Special Requests: ADEQUATE

## 2017-11-01 ENCOUNTER — Emergency Department (HOSPITAL_BASED_OUTPATIENT_CLINIC_OR_DEPARTMENT_OTHER)
Admission: EM | Admit: 2017-11-01 | Discharge: 2017-11-01 | Disposition: A | Discharge status: Home / Self Care | Payer: 59 | Attending: Emergency Medicine | Admitting: Emergency Medicine

## 2017-11-01 ENCOUNTER — Other Ambulatory Visit: Payer: Self-pay

## 2017-11-01 ENCOUNTER — Emergency Department (HOSPITAL_BASED_OUTPATIENT_CLINIC_OR_DEPARTMENT_OTHER): Payer: 59

## 2017-11-01 ENCOUNTER — Encounter (HOSPITAL_BASED_OUTPATIENT_CLINIC_OR_DEPARTMENT_OTHER): Payer: Self-pay | Admitting: *Deleted

## 2017-11-01 DIAGNOSIS — N184 Chronic kidney disease, stage 4 (severe): Secondary | ICD-10-CM | POA: Diagnosis not present

## 2017-11-01 DIAGNOSIS — R0600 Dyspnea, unspecified: Secondary | ICD-10-CM | POA: Diagnosis not present

## 2017-11-01 DIAGNOSIS — I5022 Chronic systolic (congestive) heart failure: Secondary | ICD-10-CM | POA: Diagnosis not present

## 2017-11-01 DIAGNOSIS — E1122 Type 2 diabetes mellitus with diabetic chronic kidney disease: Secondary | ICD-10-CM | POA: Diagnosis not present

## 2017-11-01 DIAGNOSIS — Z950 Presence of cardiac pacemaker: Secondary | ICD-10-CM | POA: Diagnosis not present

## 2017-11-01 DIAGNOSIS — Z794 Long term (current) use of insulin: Secondary | ICD-10-CM | POA: Diagnosis not present

## 2017-11-01 DIAGNOSIS — Z87891 Personal history of nicotine dependence: Secondary | ICD-10-CM | POA: Diagnosis not present

## 2017-11-01 DIAGNOSIS — Z79899 Other long term (current) drug therapy: Secondary | ICD-10-CM | POA: Insufficient documentation

## 2017-11-01 DIAGNOSIS — Z8673 Personal history of transient ischemic attack (TIA), and cerebral infarction without residual deficits: Secondary | ICD-10-CM | POA: Insufficient documentation

## 2017-11-01 DIAGNOSIS — I130 Hypertensive heart and chronic kidney disease with heart failure and stage 1 through stage 4 chronic kidney disease, or unspecified chronic kidney disease: Secondary | ICD-10-CM | POA: Insufficient documentation

## 2017-11-01 DIAGNOSIS — I2510 Atherosclerotic heart disease of native coronary artery without angina pectoris: Secondary | ICD-10-CM | POA: Diagnosis not present

## 2017-11-01 DIAGNOSIS — R0602 Shortness of breath: Secondary | ICD-10-CM | POA: Diagnosis present

## 2017-11-01 LAB — BASIC METABOLIC PANEL
Anion gap: 7 (ref 5–15)
BUN: 57 mg/dL — AB (ref 6–20)
CHLORIDE: 111 mmol/L (ref 101–111)
CO2: 19 mmol/L — ABNORMAL LOW (ref 22–32)
Calcium: 9.6 mg/dL (ref 8.9–10.3)
Creatinine, Ser: 3.78 mg/dL — ABNORMAL HIGH (ref 0.61–1.24)
GFR calc non Af Amer: 16 mL/min — ABNORMAL LOW (ref >60)
GFR, EST AFRICAN AMERICAN: 18 mL/min — AB (ref >60)
Glucose, Bld: 77 mg/dL (ref 65–99)
POTASSIUM: 4.2 mmol/L (ref 3.5–5.1)
SODIUM: 137 mmol/L (ref 135–145)

## 2017-11-01 LAB — CBC WITH DIFFERENTIAL/PLATELET
Basophils Absolute: 0 10*3/uL (ref 0.0–0.1)
Basophils Relative: 0 %
EOS ABS: 0.3 10*3/uL (ref 0.0–0.7)
Eosinophils Relative: 3 %
HCT: 29.8 % — ABNORMAL LOW (ref 39.0–52.0)
HEMOGLOBIN: 9.9 g/dL — AB (ref 13.0–17.0)
LYMPHS PCT: 26 %
Lymphs Abs: 2.5 10*3/uL (ref 0.7–4.0)
MCH: 27.7 pg (ref 26.0–34.0)
MCHC: 33.2 g/dL (ref 30.0–36.0)
MCV: 83.2 fL (ref 78.0–100.0)
Monocytes Absolute: 0.8 10*3/uL (ref 0.1–1.0)
Monocytes Relative: 8 %
NEUTROS PCT: 63 %
Neutro Abs: 6.1 10*3/uL (ref 1.7–7.7)
Platelets: 197 10*3/uL (ref 150–400)
RBC: 3.58 MIL/uL — AB (ref 4.22–5.81)
RDW: 17.8 % — ABNORMAL HIGH (ref 11.5–15.5)
WBC: 9.7 10*3/uL (ref 4.0–10.5)

## 2017-11-01 LAB — PROTIME-INR
INR: 1.15
PROTHROMBIN TIME: 14.6 s (ref 11.4–15.2)

## 2017-11-01 LAB — BRAIN NATRIURETIC PEPTIDE: B NATRIURETIC PEPTIDE 5: 195.5 pg/mL — AB (ref 0.0–100.0)

## 2017-11-01 LAB — TROPONIN I: TROPONIN I: 0.05 ng/mL — AB (ref <0.03)

## 2017-11-01 MED ORDER — FUROSEMIDE 10 MG/ML IJ SOLN
80.0000 mg | Freq: Once | INTRAMUSCULAR | Status: AC
  Administered 2017-11-01: 80 mg via INTRAVENOUS
  Filled 2017-11-01: qty 8

## 2017-11-01 NOTE — ED Notes (Signed)
Date and time results received: 11/01/17 2312 (use smartphrase ".now" to insert current time)  Test: troponin Critical Value: 0.05  Name of Provider Notified: Dr. Melina Copa  Orders Received? Or Actions Taken?: no new orders

## 2017-11-01 NOTE — ED Notes (Signed)
ED Provider at bedside. 

## 2017-11-01 NOTE — ED Provider Notes (Signed)
[facility] EMERGENCY DEPARTMENT Provider Note   CSN: [id number] Arrival date & time: 11/01/17  2110     History   Chief Complaint Chief Complaint  Patient presents with  . Shortness of Breath    HPI Matthew Wood is a 67 y.o. male.  He has a history of CAD CHF left bundle branch block pacer CKD who had a recent admission for pneumonia last month.  At baseline he sleeps in a recliner and uses CPAP at night.  He states his breathing is never good but over the last few days it has become worse and he is more dyspneic on exertion and having more difficulty sleeping.  He denies any change in his medications.  He has not been checking his weights.  There is been no fever no cough no chest pain no abdominal pain.  He thinks he is making a little less urine.  The history is provided by the patient.  Shortness of Breath  This is a recurrent problem. The current episode started more than 2 days ago. The problem has been gradually worsening. Associated symptoms include PND, orthopnea and leg swelling. Pertinent negatives include no fever, no headaches, no sore throat, no ear pain, no neck pain, no cough, no sputum production, no hemoptysis, no wheezing, no chest pain, no syncope, no vomiting, no abdominal pain, no rash and no leg pain. It is unknown what precipitated the problem. He has tried nothing for the symptoms. The treatment provided no relief. He has had prior hospitalizations. He has had prior ED visits. Associated medical issues include pneumonia, CAD and heart failure.    Past Medical History:  Diagnosis Date  . Anemia   . CHF (congestive heart failure) ([employer])   . Coronary artery disease    a. LHC 04/27/10 left main patent, 30-40% LAD, mid circumflex 30%, 80% mid RCA accounting for the appearance of an infract/peri-infract ischemia on nuclear testing, LV EF of 30-45%--> medical therapy  . CVA (cerebrovascular accident) ([place]) 2011   potine; "little balance problems since"  (05/25/2016)  . Dyspnea   . Gout   . History of kidney stones    "passed them" (05/25/2016)  . Hyperlipidemia   . Hypertension   . LBBB (left bundle branch block)   . Multinodular goiter   . Obesity   . OSA on CPAP   . Presence of permanent cardiac pacemaker   . Renal insufficiency   . Type II diabetes mellitus [facility])     Patient Active Problem List   Diagnosis Date Noted  . Arthritis of right knee 10/05/2017  . Hematuria 10/05/2017  . Type 2 diabetes mellitus with nephropathy ([place]) 10/05/2017  . Acute respiratory failure ([place]) 10/04/2017  . Right upper lobe pneumonia ([place]) 10/04/2017  . Cardiorenal syndrome with renal failure 10/04/2017  . Elevated troponin 10/04/2017  . CKD (chronic kidney disease), stage IV ([place]) 07/20/2016  . Chronic systolic heart failure ([place]) 02/21/2016  . Carotid aneurysm, left ([place]) 11/15/2013  . CAD (coronary artery disease), native coronary artery 11/15/2013  . Left pontine CVA ([place]) 11/15/2013  . Essential hypertension 11/15/2013  . Sleep apnea 11/15/2013  . Hyperlipidemia 11/15/2013  . Erectile dysfunction 11/15/2013    Past Surgical History:  Procedure Laterality Date  . COLONOSCOPY WITH PROPOFOL N/A 04/16/2014   Procedure: COLONOSCOPY WITH PROPOFOL;  Surgeon: [name], MD;  Location: [facility] ENDOSCOPY;  Service: Endoscopy;  Laterality: N/A;  . EP IMPLANTABLE DEVICE N/A 05/25/2016   Procedure: BiV ICD Insertion CRT-D;  Surgeon: [name], MD;  Location: [facility] CV LAB;  Service: Cardiovascular;  Laterality: N/A;  . INSERT / REPLACE / REMOVE PACEMAKER  05/25/2016   biventricular pacemaker  . LAPAROSCOPIC CHOLECYSTECTOMY          Home Medications    Prior to Admission medications   Medication Sig Start Date End Date Taking? Authorizing Provider  acetaminophen (TYLENOL) 500 MG tablet Take 500-1,000 mg by mouth every 8 (eight) hours as needed (for pain).    [provider]  albuterol (ACCUNEB) 0.63 MG/3ML  nebulizer solution Take 3 mLs (0.63 mg total) by nebulization every 6 (six) hours as needed for wheezing. 10/07/17   Roxan Hockey, MD  allopurinol (ZYLOPRIM) 300 MG tablet Take 300 mg by mouth daily.    [provider]  amLODipine-benazepril (LOTREL) 10-40 MG per capsule Take 1 capsule by mouth every morning.     [provider]  aspirin EC 325 MG tablet Take 325 mg by mouth at bedtime.    [provider]  carvedilol (COREG) 25 MG tablet Take 25 mg by mouth 2 (two) times daily with a meal.    [provider]  cloNIDine (CATAPRES) 0.2 MG tablet Take 0.1-0.2 mg by mouth See admin instructions. Take 0.1 mg by mouth in the morning and 0.2 mg in the evening    [provider]  doxycycline (VIBRA-TABS) 100 MG tablet Take 1 tablet (100 mg total) by mouth 2 (two) times daily. For Pneumonia 10/07/17   Roxan Hockey, MD  furosemide (LASIX) 40 MG tablet Take 2 tablets (80 mg total) by mouth daily. 10/07/17   Roxan Hockey, MD  glimepiride (AMARYL) 4 MG tablet Take 4 mg by mouth 2 (two) times daily.    [provider]  HUMALOG KWIKPEN 100 UNIT/ML KiwkPen Inject 20 Units into the skin daily after breakfast.  09/19/17   [provider]  Insulin Glargine (BASAGLAR KWIKPEN) 100 UNIT/ML SOPN Inject 40 Units into the skin at bedtime.  03/12/16   [provider]  isosorbide-hydrALAZINE (BIDIL) 20-37.5 MG tablet Take 1 tablet by mouth 2 (two) times daily. 10/07/17   Emokpae, Courage, MD  KLOR-CON M10 10 MEQ tablet TAKE 1 TABLET (10 MEQ TOTAL) BY MOUTH 2 (TWO) TIMES DAILY. 07/18/17   Belva Crome, MD  metolazone (ZAROXOLYN) 2.5 MG tablet Take 1 tablet by mouth once weekly if you gain 3-4 pounds in a day 10/07/17   Roxan Hockey, MD  NON FORMULARY CPAP: At bedtime    [provider]  rosuvastatin (CRESTOR) 20 MG tablet Take 20 mg by mouth daily.    [provider]  triamcinolone (NASACORT ALLERGY 24HR) 55 MCG/ACT AERO nasal  inhaler Place 1 spray into the nose daily.    [provider]    Family History Family History  Problem Relation Age of Onset  . Hypertension Mother   . Breast cancer Mother   . Heart attack Father   . Stroke Father   . Diabetes type II Father   . Hypertension Sister     Social History Social History   Tobacco Use  . Smoking status: Former Smoker    Packs/day: 0.50    Years: 27.00    Pack years: 13.50    Types: Cigarettes    Last attempt to quit: 03/25/2010    Years since quitting: 7.6  . Smokeless tobacco: Never Used  Substance Use Topics  . Alcohol use: Yes    Comment: 05/25/2016 "was an occasional drinker; maybe  4 drinks/week; stopped after stroke in 2011"  . Drug use: No     Allergies   Patient has no known allergies.   Review of Systems Review of Systems  Constitutional: Negative for chills and fever.  HENT: Negative for ear pain and sore throat.   Eyes: Negative for pain and visual disturbance.  Respiratory: Positive for shortness of breath. Negative for cough, hemoptysis, sputum production and wheezing.   Cardiovascular: Positive for orthopnea, leg swelling and PND. Negative for chest pain, palpitations and syncope.  Gastrointestinal: Negative for abdominal pain and vomiting.  Genitourinary: Negative for dysuria and hematuria.  Musculoskeletal: Negative for arthralgias, back pain and neck pain.  Skin: Negative for color change and rash.  Neurological: Negative for seizures, syncope and headaches.  All other systems reviewed and are negative.    Physical Exam Updated Vital Signs BP (!) 159/75   Pulse 77   Temp 98.6 F (37 C) (Oral)   Resp 16   Ht 5\' 11"  (1.803 m)   Wt 131.5 kg (290 lb)   SpO2 98%   BMI 40.45 kg/m   Physical Exam  Constitutional: He appears well-developed and well-nourished.  HENT:  Head: Normocephalic and atraumatic.  Eyes: Pupils are equal, round, and reactive to light. Conjunctivae and EOM are normal.  Neck: Neck  supple.  Cardiovascular: Normal rate and regular rhythm.  No murmur heard. Pulmonary/Chest: Effort normal and breath sounds normal. No respiratory distress.  Abdominal: Soft. There is no tenderness.  Musculoskeletal:       Right lower leg: He exhibits edema. He exhibits no tenderness.       Left lower leg: He exhibits edema. He exhibits no tenderness.  Neurological: He is alert.  Skin: Skin is warm and dry.  Psychiatric: He has a normal mood and affect.  Nursing note and vitals reviewed.    ED Treatments / Results  Labs (all labs ordered are listed, but only abnormal results are displayed) Labs Reviewed  BASIC METABOLIC PANEL - Abnormal; Notable for the following components:      Result Value   CO2 19 (*)    BUN 57 (*)    Creatinine, Ser 3.78 (*)    GFR calc non Af Amer 16 (*)    GFR calc Af Amer 18 (*)    All other components within normal limits  TROPONIN I - Abnormal; Notable for the following components:   Troponin I 0.05 (*)    All other components within normal limits  BRAIN NATRIURETIC PEPTIDE - Abnormal; Notable for the following components:   B Natriuretic Peptide 195.5 (*)    All other components within normal limits  CBC WITH DIFFERENTIAL/PLATELET - Abnormal; Notable for the following components:   RBC 3.58 (*)    Hemoglobin 9.9 (*)    HCT 29.8 (*)    RDW 17.8 (*)    All other components within normal limits  PROTIME-INR    EKG EKG Interpretation  Date/Time:  Tuesday Nov 01 2017 21:33:54 EDT Ventricular Rate:  79 PR Interval:    QRS Duration: 159 QT Interval:  443 QTC Calculation: 508 R Axis:   -73 Text Interpretation:  Sinus rhythm Nonspecific IVCD with LAD Left ventricular hypertrophy Probable inferior infarct, acute Anterolateral infarct, old Baseline wander in lead(s) I II aVR new widening and axis change compared with 4/19 Confirmed by Aletta Edouard [id number]) on 11/01/2017 9:48:33 PM   Radiology Dg Chest 2 View  Result Date: 11/01/2017 CLINICAL  DATA:  Shortness of breath EXAM: CHEST -  2 VIEW COMPARISON:  10/06/2017 FINDINGS: There is a leftchest wall AICDwith leads projecting within the right atrium, right ventricle and coronary sinus. Unchanged cardiomegaly. No focal airspace consolidation or pulmonary edema. No pleural effusion or pneumothorax. IMPRESSION: No active cardiopulmonary disease. Electronically Signed   By: Ulyses Jarred M.D.   On: 11/01/2017 22:46    Procedures Procedures (including critical care time)  Medications Ordered in ED Medications  furosemide (LASIX) injection 80 mg (80 mg Intravenous Given 11/01/17 2305)     Initial Impression / Assessment and Plan / ED Course  I have reviewed the triage vital signs and the nursing notes.  Pertinent labs & imaging results that were available during my care of the patient were reviewed by me and considered in my medical decision making (see chart for details).  Clinical Course as of Nov 03 946  Tue Nov 01, 2017  2216 Chest x-ray done and looks much improved from April x-ray with a right upper lobe pneumonia.   [MB]  2314 Patient's labs also are fairly unrevealing.  His BNP is improved from his baseline hematocrit and creatinine are stable.  His troponin 0 0.05 but that is in the range of his typical findings.   [MB]  2321 Patient feels improved after the Lasix and would like to be discharged.  He has follow-up with cardiology in a couple weeks but I did ask him to call the office tomorrow to see if they can get a look at him earlier.  He understands he needs to take his regular medicines and stated to a low-salt diet.   [MB]    Clinical Course User Index [MB] Hayden Rasmussen, MD    Final Clinical Impressions(s) / ED Diagnoses   Final diagnoses:  Dyspnea, unspecified type    ED Discharge Orders    None       Hayden Rasmussen, MD 11/02/17 [phone]

## 2017-11-01 NOTE — ED Triage Notes (Signed)
SOB. Pneumonia in April. Hx of CHF. No coughing. He is ambulatory using a cane.

## 2017-11-01 NOTE — Discharge Instructions (Addendum)
Your evaluated in the emergency room for worsening shortness of breath and peripheral edema.  Your lab work looked the same or better than usual values and your chest x-ray did not show any obvious pneumonia.  You need to call your cardiologist tomorrow to arrange close follow-up and continue your regular medicines.

## 2017-11-02 ENCOUNTER — Telehealth: Payer: Self-pay | Admitting: Interventional Cardiology

## 2017-11-02 NOTE — Telephone Encounter (Signed)
Pt's wife calling:  Stating her pt went to the ED yesterday because of SOB. Wife has question concerning pt's care and medications he takes. Pt was told to f/u with Dr Tamala Julian also. Pt already has an appt 11/18/17 and wife need to to know if he can wait until then. Please advise wife.

## 2017-11-02 NOTE — Telephone Encounter (Signed)
Spoke with wife and she states pt is anemic and kept asking her to turn the air up and down yesterday.  When air was down pt would become SOB.  She ended up taking pt to ER last night because SOB worsened.  CXR was clear, BNP was 195.5.  Pt doing better since he got home.  Advised wife to keep 6/7 appt and to call if further problems.  Wife appreciative for call.

## 2017-11-18 ENCOUNTER — Ambulatory Visit: Payer: 59 | Admitting: Interventional Cardiology

## 2017-11-18 ENCOUNTER — Encounter (INDEPENDENT_AMBULATORY_CARE_PROVIDER_SITE_OTHER): Payer: Self-pay

## 2017-11-18 ENCOUNTER — Encounter: Payer: Self-pay | Admitting: Interventional Cardiology

## 2017-11-18 VITALS — BP 138/84 | HR 91 | Ht 71.0 in | Wt 298.8 lb

## 2017-11-18 DIAGNOSIS — E7849 Other hyperlipidemia: Secondary | ICD-10-CM

## 2017-11-18 DIAGNOSIS — N184 Chronic kidney disease, stage 4 (severe): Secondary | ICD-10-CM | POA: Diagnosis not present

## 2017-11-18 DIAGNOSIS — G4730 Sleep apnea, unspecified: Secondary | ICD-10-CM

## 2017-11-18 DIAGNOSIS — I10 Essential (primary) hypertension: Secondary | ICD-10-CM | POA: Diagnosis not present

## 2017-11-18 DIAGNOSIS — I6350 Cerebral infarction due to unspecified occlusion or stenosis of unspecified cerebral artery: Secondary | ICD-10-CM

## 2017-11-18 DIAGNOSIS — I639 Cerebral infarction, unspecified: Secondary | ICD-10-CM

## 2017-11-18 DIAGNOSIS — I2510 Atherosclerotic heart disease of native coronary artery without angina pectoris: Secondary | ICD-10-CM | POA: Diagnosis not present

## 2017-11-18 DIAGNOSIS — E1121 Type 2 diabetes mellitus with diabetic nephropathy: Secondary | ICD-10-CM | POA: Diagnosis not present

## 2017-11-18 DIAGNOSIS — I5022 Chronic systolic (congestive) heart failure: Secondary | ICD-10-CM

## 2017-11-18 NOTE — Patient Instructions (Signed)
Medication Instructions:  Your physician recommends that you continue on your current medications as directed. Please refer to the Current Medication list given to you today.  Labwork: None  Testing/Procedures: None  Follow-Up: Your physician wants you to follow-up in: 6 months with Dr. Tamala Julian. You will receive a reminder letter in the mail two months in advance. If you don't receive a letter, please call our office to schedule the follow-up appointment.   Any Other Special Instructions Will Be Listed Below (If Applicable).  Make sure you weigh everyday under the same circumstances.  Let us know if you gain 3 lbs overnight or 5 lbs in a week or are very short of breath.    If you need a refill on your cardiac medications before your next appointment, please call your pharmacy.

## 2017-11-18 NOTE — Progress Notes (Signed)
Cardiology Office Note    Date:  11/18/2017   ID:  Matthew, Wood [birth year], MRN [id number]  PCP:  Seward Carol, MD  Cardiologist: Sinclair Grooms, MD   Chief Complaint  Patient presents with  . Hypertension  . Coronary Artery Disease    History of Present Illness:  Matthew Wood is a 67 y.o. male hx of CAD, chronic combined systolic and diastolic heart failure, HTN, HLD, LBBB, multinodular goiter, OSA on CPAP, DM, CVA and CKD stage III-IV. Recently diagnosed to have ejection fraction by echo in the 30-35% range in the setting of ischemic cardiomyopathy and CRT-D implanted 05/2016.  Recent decompensation led to addition of metolazone.  Hospitalized in April with pneumonia, leukocytosis, and suspected septicemia.  In mid May went back to the emergency room with increasing dyspnea and received IV diuretic therapy.  He subsequently has been back on his regimen of weighing each morning under the same circumstances.  He is watching his diet, limiting salt and fluid intake.  He is on metolazone as needed if weight increase greater than 3 pounds in 24 hours or 5 pounds in 1 week.  Feels relatively well today.  Having difficulty with cold intolerance.  Has significant anemia with most recent hemoglobin of 9.9 Nov 01, 2017.  No angina.  No syncope.   Past Medical History:  Diagnosis Date  . Anemia   . CHF (congestive heart failure) ([place])   . Coronary artery disease    a. LHC 04/27/10 left main patent, 30-40% LAD, mid circumflex 30%, 80% mid RCA accounting for the appearance of an infract/peri-infract ischemia on nuclear testing, LV EF of 30-45%--> medical therapy  . CVA (cerebrovascular accident) ([place]) 2011   potine; "little balance problems since" (05/25/2016)  . Dyspnea   . Gout   . History of kidney stones    "passed them" (05/25/2016)  . Hyperlipidemia   . Hypertension   . LBBB (left bundle branch block)   . Multinodular goiter   . Obesity   . OSA on CPAP   . Presence  of permanent cardiac pacemaker   . Renal insufficiency   . Type II diabetes mellitus ([place])     Past Surgical History:  Procedure Laterality Date  . COLONOSCOPY WITH PROPOFOL N/A 04/16/2014   Procedure: COLONOSCOPY WITH PROPOFOL;  Surgeon: [name], MD;  Location: [facility];  Service: Endoscopy;  Laterality: N/A;  . EP IMPLANTABLE DEVICE N/A 05/25/2016   Procedure: BiV ICD Insertion CRT-D;  Surgeon: [name], MD;  Location: [facility];  Service: Cardiovascular;  Laterality: N/A;  . INSERT / REPLACE / REMOVE PACEMAKER  05/25/2016   biventricular pacemaker  . LAPAROSCOPIC CHOLECYSTECTOMY      Current Medications: Outpatient Medications Prior to Visit  Medication Sig Dispense Refill  . acetaminophen (TYLENOL) 500 MG tablet Take 500-1,000 mg by mouth every 8 (eight) hours as needed (for pain).    Marland Kitchen albuterol (ACCUNEB) 0.63 MG/3ML nebulizer solution Take 3 mLs (0.63 mg total) by nebulization every 6 (six) hours as needed for wheezing. 75 mL 12  . allopurinol (ZYLOPRIM) 300 MG tablet Take 300 mg by mouth daily.    Marland Kitchen amLODipine-benazepril (LOTREL) 10-40 MG per capsule Take 1 capsule by mouth every morning.     Marland Kitchen aspirin EC 325 MG tablet Take 325 mg by mouth at bedtime.    . carvedilol (COREG) 25 MG tablet Take 25 mg by mouth 2 (two) times daily with a meal.    .  cloNIDine (CATAPRES) 0.2 MG tablet Take 0.1-0.2 mg by mouth See admin instructions. Take 0.1 mg by mouth in the morning and 0.2 mg in the evening    . furosemide (LASIX) 40 MG tablet Take 2 tablets (80 mg total) by mouth daily. 60 tablet 3  . glimepiride (AMARYL) 4 MG tablet Take 4 mg by mouth 2 (two) times daily.    Marland Kitchen HUMALOG KWIKPEN 100 UNIT/ML KiwkPen Inject 16 Units into the skin daily after breakfast.   5  . Insulin Glargine (BASAGLAR KWIKPEN) 100 UNIT/ML SOPN Inject 30 Units into the skin at bedtime.     . isosorbide-hydrALAZINE (BIDIL) 20-37.5 MG tablet Take 1 tablet by mouth 2 (two) times daily. 60  tablet 4  . KLOR-CON M10 10 MEQ tablet TAKE 1 TABLET (10 MEQ TOTAL) BY MOUTH 2 (TWO) TIMES DAILY. 180 tablet 1  . metolazone (ZAROXOLYN) 2.5 MG tablet Take 1 tablet by mouth once weekly if you gain 3-4 pounds in a day 15 tablet 2  . NON FORMULARY CPAP: At bedtime    . rosuvastatin (CRESTOR) 20 MG tablet Take 20 mg by mouth daily.    Marland Kitchen triamcinolone (NASACORT ALLERGY 24HR) 55 MCG/ACT AERO nasal inhaler Place 1 spray into the nose daily.    Marland Kitchen doxycycline (VIBRA-TABS) 100 MG tablet Take 1 tablet (100 mg total) by mouth 2 (two) times daily. For Pneumonia (Patient not taking: Reported on 11/18/2017) 14 tablet 0   No facility-administered medications prior to visit.      Allergies:   Patient has no known allergies.   Social History   Socioeconomic History  . Marital status: Married    Spouse name: Not on file  . Number of children: 1  . Years of education: Not on file  . Highest education level: Not on file  Occupational History  . Occupation: Diplomatic Services operational officer: [employer]  Social Needs  . Financial resource strain: Not on file  . Food insecurity:    Worry: Not on file    Inability: Not on file  . Transportation needs:    Medical: Not on file    Non-medical: Not on file  Tobacco Use  . Smoking status: Former Smoker    Packs/day: 0.50    Years: 27.00    Pack years: 13.50    Types: Cigarettes    Last attempt to quit: 03/25/2010    Years since quitting: 7.6  . Smokeless tobacco: Never Used  Substance and Sexual Activity  . Alcohol use: Yes    Comment: 05/25/2016 "was an occasional drinker; maybe 4 drinks/week; stopped after stroke in 2011"  . Drug use: No  . Sexual activity: Not on file  Lifestyle  . Physical activity:    Days per week: Not on file    Minutes per session: Not on file  . Stress: Not on file  Relationships  . Social connections:    Talks on phone: Not on file    Gets together: Not on file    Attends religious service: Not on file    Active  member of club or organization: Not on file    Attends meetings of clubs or organizations: Not on file    Relationship status: Not on file  Other Topics Concern  . Not on file  Social History Narrative  . Not on file     Family History:  The patient's family history includes Breast cancer in his mother; Diabetes type II in his father; Heart attack  in his father; Hypertension in his mother and sister; Stroke in his father.   ROS:   Please see the history of present illness.    Lower extremity swelling, sleeps on recliner, wears CPAP, no nitroglycerin use, in good spirits. All other systems reviewed and are negative.   PHYSICAL EXAM:   VS:  BP 138/84   Pulse 91   Ht 5\' 11"  (1.803 m)   Wt 298 lb 12.8 oz (135.5 kg)   BMI 41.67 kg/m    GEN: Well nourished, well developed, in no acute distress  HEENT: normal  Neck: no JVD, carotid bruits, or masses Cardiac: RRR; no murmurs, rubs, or gallops,no edema  Respiratory:  clear to auscultation bilaterally, normal work of breathing GI: soft, nontender, nondistended, + BS MS: no deformity or atrophy  Skin: warm and dry, no rash Neuro:  Alert and Oriented x 3, Strength and sensation are intact Psych: euthymic mood, full affect  Wt Readings from Last 3 Encounters:  11/18/17 298 lb 12.8 oz (135.5 kg)  11/01/17 290 lb (131.5 kg)  10/07/17 (!) 302 lb 3.2 oz (137.1 kg)      Studies/Labs Reviewed:   EKG:  EKG not repeated.  Recent Labs: 08/11/2017: NT-Pro BNP 740 10/04/2017: ALT 19 11/01/2017: B Natriuretic Peptide 195.5; BUN 57; Creatinine, Ser 3.78; Hemoglobin 9.9; Platelets 197; Potassium 4.2; Sodium 137   Lipid Panel    Component Value Date/Time   CHOL  02/08/2010 0600    171        ATP III CLASSIFICATION:  <200     mg/dL   Desirable  200-239  mg/dL   Borderline High  >=240    mg/dL   High          TRIG 248 (H) 02/08/2010 0600   HDL 34 (L) 02/08/2010 0600   CHOLHDL 5.0 02/08/2010 0600   VLDL 50 (H) 02/08/2010 0600   LDLCALC   02/08/2010 0600    87        Total Cholesterol/HDL:CHD Risk Coronary Heart Disease Risk Table                     Men   Women  1/2 Average Risk   3.4   3.3  Average Risk       5.0   4.4  2 X Average Risk   9.6   7.1  3 X Average Risk  23.4   11.0        Use the calculated Patient Ratio above and the CHD Risk Table to determine the patient's CHD Risk.        ATP III CLASSIFICATION (LDL):  <100     mg/dL   Optimal  100-129  mg/dL   Near or Above                    Optimal  130-159  mg/dL   Borderline  160-189  mg/dL   High  >190     mg/dL   Very High    Additional studies/ records that were reviewed today include:  No new imaging data    ASSESSMENT:    1. Chronic systolic heart failure ([place])   2. CKD (chronic kidney disease), stage IV ([place])   3. Coronary artery disease involving native coronary artery of native heart without angina pectoris   4. Essential hypertension   5. Other hyperlipidemia   6. Left pontine CVA ([place])   7. Sleep apnea, unspecified type   8. Type  2 diabetes mellitus with nephropathy ([place])      PLAN:  In order of problems listed above:  1. Tenuous situation.  Obvious volume overload.  When we intensify diuretic therapy, kidney function deteriorates.  He is being followed by nephrology.  Most recent creatinine is 3.78.  Diuretic therapy is being managed by nephrology and cardiology jointly.  Encouraged to weigh daily under similar circumstances.  Use metolazone as required to keep fluid level stable.  Can use metolazone if increase in weight by greater than 3 pounds in 24 hours or 5 pounds in 1 week.  Clinical follow-up with [place] in 6 months. 2. Stable kidney function as noted. 3. No angina. 4. Blood pressure control is adequate staying less than 140/90 mmHg. 5. Not addressed on this office visit    Medication Adjustments/Labs and Tests Ordered: Current medicines are reviewed at length with the patient today.  Concerns regarding medicines are  outlined above.  Medication changes, Labs and Tests ordered today are listed in the Patient Instructions below. Patient Instructions  Medication Instructions:  Your physician recommends that you continue on your current medications as directed. Please refer to the Current Medication list given to you today.  Labwork: None  Testing/Procedures: None  Follow-Up: Your physician wants you to follow-up in: 6 months with Dr. Tamala Julian. You will receive a reminder letter in the mail two months in advance. If you don't receive a letter, please call our office to schedule the follow-up appointment.   Any Other Special Instructions Will Be Listed Below (If Applicable).  Make sure you weigh everyday under the same circumstances.  Let us know if you gain 3 lbs overnight or 5 lbs in a week or are very short of breath.    If you need a refill on your cardiac medications before your next appointment, please call your pharmacy.      Signed, Sinclair Grooms, MD  11/18/2017 3:59 PM    [facility] [address], [place], [place]  [address] Phone: [phone]; Fax: [phone]

## 2017-11-28 ENCOUNTER — Other Ambulatory Visit (HOSPITAL_COMMUNITY): Payer: Self-pay | Admitting: *Deleted

## 2017-11-29 ENCOUNTER — Ambulatory Visit (HOSPITAL_COMMUNITY)
Admission: RE | Admit: 2017-11-29 | Discharge: 2017-11-29 | Disposition: A | Discharge status: Home / Self Care | Payer: 59 | Source: Ambulatory Visit | Attending: Nephrology | Admitting: Nephrology

## 2017-11-29 VITALS — BP 123/57 | HR 61 | Temp 98.6°F | Resp 18 | Ht 71.0 in | Wt 300.0 lb

## 2017-11-29 DIAGNOSIS — D631 Anemia in chronic kidney disease: Secondary | ICD-10-CM | POA: Diagnosis not present

## 2017-11-29 DIAGNOSIS — N189 Chronic kidney disease, unspecified: Secondary | ICD-10-CM | POA: Diagnosis present

## 2017-11-29 DIAGNOSIS — N184 Chronic kidney disease, stage 4 (severe): Secondary | ICD-10-CM

## 2017-11-29 LAB — POCT HEMOGLOBIN-HEMACUE: Hemoglobin: 9.1 g/dL — ABNORMAL LOW (ref 13.0–17.0)

## 2017-11-29 MED ORDER — DARBEPOETIN ALFA 40 MCG/0.4ML IJ SOSY
PREFILLED_SYRINGE | INTRAMUSCULAR | Status: AC
  Administered 2017-11-29: 40 ug
  Filled 2017-11-29: qty 0.4

## 2017-11-29 MED ORDER — DARBEPOETIN ALFA 40 MCG/0.4ML IJ SOSY: 40.0000 ug | PREFILLED_SYRINGE | INTRAMUSCULAR | Status: DC

## 2017-11-29 MED ORDER — SODIUM CHLORIDE 0.9 % IV SOLN
510.0000 mg | Freq: Once | INTRAVENOUS | Status: AC
  Administered 2017-11-29: 510 mg via INTRAVENOUS
  Filled 2017-11-29: qty 17

## 2017-11-29 NOTE — Discharge Instructions (Signed)
Darbepoetin Alfa injection °What is this medicine? °DARBEPOETIN ALFA (dar be POE e tin AL fa) helps your body make more red blood cells. It is used to treat anemia caused by chronic kidney failure and chemotherapy. °This medicine may be used for other purposes; ask your health care provider or pharmacist if you have questions. °COMMON BRAND NAME(S): Aranesp °What should I tell my health care provider before I take this medicine? °They need to know if you have any of these conditions: °-blood clotting disorders or history of blood clots °-cancer patient not on chemotherapy °-cystic fibrosis °-heart disease, such as angina, heart failure, or a history of a heart attack °-hemoglobin level of 12 g/dL or greater °-high blood pressure °-low levels of folate, iron, or vitamin B12 °-seizures °-an unusual or allergic reaction to darbepoetin, erythropoietin, albumin, hamster proteins, latex, other medicines, foods, dyes, or preservatives °-pregnant or trying to get pregnant °-breast-feeding °How should I use this medicine? °This medicine is for injection into a vein or under the skin. It is usually given by a health care professional in a hospital or clinic setting. °If you get this medicine at home, you will be taught how to prepare and give this medicine. Use exactly as directed. Take your medicine at regular intervals. Do not take your medicine more often than directed. °It is important that you put your used needles and syringes in a special sharps container. Do not put them in a trash can. If you do not have a sharps container, call your pharmacist or healthcare provider to get one. °A special MedGuide will be given to you by the pharmacist with each prescription and refill. Be sure to read this information carefully each time. °Talk to your pediatrician regarding the use of this medicine in children. While this medicine may be used in children as young as 1 year for selected conditions, precautions do  apply. °Overdosage: If you think you have taken too much of this medicine contact a poison control center or emergency room at once. °NOTE: This medicine is only for you. Do not share this medicine with others. °What if I miss a dose? °If you miss a dose, take it as soon as you can. If it is almost time for your next dose, take only that dose. Do not take double or extra doses. °What may interact with this medicine? °Do not take this medicine with any of the following medications: °-epoetin alfa °This list may not describe all possible interactions. Give your health care provider a list of all the medicines, herbs, non-prescription drugs, or dietary supplements you use. Also tell them if you smoke, drink alcohol, or use illegal drugs. Some items may interact with your medicine. °What should I watch for while using this medicine? °Your condition will be monitored carefully while you are receiving this medicine. °You may need blood work done while you are taking this medicine. °What side effects may I notice from receiving this medicine? °Side effects that you should report to your doctor or health care professional as soon as possible: °-allergic reactions like skin rash, itching or hives, swelling of the face, lips, or tongue °-breathing problems °-changes in vision °-chest pain °-confusion, trouble speaking or understanding °-feeling faint or lightheaded, falls °-high blood pressure °-muscle aches or pains °-pain, swelling, warmth in the leg °-rapid weight gain °-severe headaches °-sudden numbness or weakness of the face, arm or leg °-trouble walking, dizziness, loss of balance or coordination °-seizures (convulsions) °-swelling of the ankles, feet, hands °-  unusually weak or tired °Side effects that usually do not require medical attention (report to your doctor or health care professional if they continue or are bothersome): °-diarrhea °-fever, chills (flu-like symptoms) °-headaches °-nausea, vomiting °-redness,  stinging, or swelling at site where injected °This list may not describe all possible side effects. Call your doctor for medical advice about side effects. You may report side effects to FDA at 1-800-FDA-1088. °Where should I keep my medicine? °Keep out of the reach of children. °Store in a refrigerator between 2 and 8 degrees C (36 and 46 degrees F). Do not freeze. Do not shake. Throw away any unused portion if using a single-dose vial. Throw away any unused medicine after the expiration date. °NOTE: This sheet is a summary. It may not cover all possible information. If you have questions about this medicine, talk to your doctor, pharmacist, or health care provider. °© 2018 Elsevier/Gold Standard (2016-01-19 19:52:26) °Ferumoxytol injection °What is this medicine? °FERUMOXYTOL is an iron complex. Iron is used to make healthy red blood cells, which carry oxygen and nutrients throughout the body. This medicine is used to treat iron deficiency anemia in people with chronic kidney disease. °This medicine may be used for other purposes; ask your health care provider or pharmacist if you have questions. °COMMON BRAND NAME(S): Feraheme °What should I tell my health care provider before I take this medicine? °They need to know if you have any of these conditions: °-anemia not caused by low iron levels °-high levels of iron in the blood °-magnetic resonance imaging (MRI) test scheduled °-an unusual or allergic reaction to iron, other medicines, foods, dyes, or preservatives °-pregnant or trying to get pregnant °-breast-feeding °How should I use this medicine? °This medicine is for injection into a vein. It is given by a health care professional in a hospital or clinic setting. °Talk to your pediatrician regarding the use of this medicine in children. Special care may be needed. °Overdosage: If you think you have taken too much of this medicine contact a poison control center or emergency room at once. °NOTE: This medicine  is only for you. Do not share this medicine with others. °What if I miss a dose? °It is important not to miss your dose. Call your doctor or health care professional if you are unable to keep an appointment. °What may interact with this medicine? °This medicine may interact with the following medications: °-other iron products °This list may not describe all possible interactions. Give your health care provider a list of all the medicines, herbs, non-prescription drugs, or dietary supplements you use. Also tell them if you smoke, drink alcohol, or use illegal drugs. Some items may interact with your medicine. °What should I watch for while using this medicine? °Visit your doctor or healthcare professional regularly. Tell your doctor or healthcare professional if your symptoms do not start to get better or if they get worse. You may need blood work done while you are taking this medicine. °You may need to follow a special diet. Talk to your doctor. Foods that contain iron include: whole grains/cereals, dried fruits, beans, or peas, leafy green vegetables, and organ meats (liver, kidney). °What side effects may I notice from receiving this medicine? °Side effects that you should report to your doctor or health care professional as soon as possible: °-allergic reactions like skin rash, itching or hives, swelling of the face, lips, or tongue °-breathing problems °-changes in blood pressure °-feeling faint or lightheaded, falls °-fever or chills °-flushing,   sweating, or hot feelings °-swelling of the ankles or feet °Side effects that usually do not require medical attention (report to your doctor or health care professional if they continue or are bothersome): °-diarrhea °-headache °-nausea, vomiting °-stomach pain °This list may not describe all possible side effects. Call your doctor for medical advice about side effects. You may report side effects to FDA at 1-800-FDA-1088. °Where should I keep my medicine? °This drug  is given in a hospital or clinic and will not be stored at home. °NOTE: This sheet is a summary. It may not cover all possible information. If you have questions about this medicine, talk to your doctor, pharmacist, or health care provider. °© 2018 Elsevier/Gold Standard (2015-07-03 12:41:49) ° °

## 2017-12-06 ENCOUNTER — Other Ambulatory Visit: Payer: Self-pay | Admitting: Interventional Cardiology

## 2017-12-06 ENCOUNTER — Ambulatory Visit (INDEPENDENT_AMBULATORY_CARE_PROVIDER_SITE_OTHER): Payer: 59 | Admitting: *Deleted

## 2017-12-06 DIAGNOSIS — I255 Ischemic cardiomyopathy: Secondary | ICD-10-CM | POA: Diagnosis not present

## 2017-12-06 DIAGNOSIS — I5022 Chronic systolic (congestive) heart failure: Secondary | ICD-10-CM

## 2017-12-06 NOTE — Progress Notes (Signed)
Remote ICD transmission.   

## 2017-12-07 ENCOUNTER — Encounter: Payer: Self-pay | Admitting: Cardiology

## 2017-12-08 LAB — CUP PACEART REMOTE DEVICE CHECK
Battery Remaining Longevity: 74 mo
Brady Statistic AP VP Percent: 68.6 %
Brady Statistic AP VS Percent: 0.38 %
Brady Statistic AS VP Percent: 30.3 %
Brady Statistic AS VS Percent: 0.72 %
HighPow Impedance: 54 Ohm
Implantable Lead Implant Date: 20171212
Implantable Lead Implant Date: 20171212
Implantable Lead Location: 753858
Implantable Lead Location: 753860
Implantable Lead Model: 4298
Implantable Pulse Generator Implant Date: 20171212
Lead Channel Impedance Value: 132.321
Lead Channel Impedance Value: 132.321
Lead Channel Impedance Value: 228 Ohm
Lead Channel Impedance Value: 247 Ohm
Lead Channel Impedance Value: 285 Ohm
Lead Channel Impedance Value: 342 Ohm
Lead Channel Impedance Value: 342 Ohm
Lead Channel Impedance Value: 418 Ohm
Lead Channel Impedance Value: 456 Ohm
Lead Channel Pacing Threshold Amplitude: 0.625 V
Lead Channel Pacing Threshold Pulse Width: 0.4 ms
Lead Channel Pacing Threshold Pulse Width: 0.4 ms
Lead Channel Sensing Intrinsic Amplitude: 18.125 mV
Lead Channel Sensing Intrinsic Amplitude: 18.125 mV
Lead Channel Sensing Intrinsic Amplitude: 4.125 mV
Lead Channel Setting Pacing Amplitude: 1.25 V
Lead Channel Setting Pacing Amplitude: 2 V
Lead Channel Setting Pacing Pulse Width: 0.4 ms
Lead Channel Setting Pacing Pulse Width: 0.4 ms
MDC IDC LEAD IMPLANT DT: 20171212
MDC IDC LEAD LOCATION: 753859
MDC IDC MSMT BATTERY VOLTAGE: 2.99 V
MDC IDC MSMT LEADCHNL LV IMPEDANCE VALUE: 118.56 Ohm
MDC IDC MSMT LEADCHNL LV IMPEDANCE VALUE: 118.56 Ohm
MDC IDC MSMT LEADCHNL LV IMPEDANCE VALUE: 126.667
MDC IDC MSMT LEADCHNL LV IMPEDANCE VALUE: 247 Ohm
MDC IDC MSMT LEADCHNL LV IMPEDANCE VALUE: 399 Ohm
MDC IDC MSMT LEADCHNL LV IMPEDANCE VALUE: 399 Ohm
MDC IDC MSMT LEADCHNL LV IMPEDANCE VALUE: 418 Ohm
MDC IDC MSMT LEADCHNL RA IMPEDANCE VALUE: 418 Ohm
MDC IDC MSMT LEADCHNL RA PACING THRESHOLD AMPLITUDE: 0.625 V
MDC IDC MSMT LEADCHNL RA SENSING INTR AMPL: 4.125 mV
MDC IDC MSMT LEADCHNL RV IMPEDANCE VALUE: 247 Ohm
MDC IDC MSMT LEADCHNL RV PACING THRESHOLD AMPLITUDE: 1 V
MDC IDC MSMT LEADCHNL RV PACING THRESHOLD PULSEWIDTH: 0.4 ms
MDC IDC SESS DTM: 20190625083724
MDC IDC SET LEADCHNL RV PACING AMPLITUDE: 2.5 V
MDC IDC SET LEADCHNL RV SENSING SENSITIVITY: 0.3 mV
MDC IDC STAT BRADY RA PERCENT PACED: 68.13 %
MDC IDC STAT BRADY RV PERCENT PACED: 92.48 %

## 2017-12-27 ENCOUNTER — Telehealth: Payer: Self-pay | Admitting: Interventional Cardiology

## 2017-12-27 ENCOUNTER — Ambulatory Visit (HOSPITAL_COMMUNITY)
Admission: RE | Admit: 2017-12-27 | Discharge: 2017-12-27 | Disposition: A | Discharge status: Home / Self Care | Payer: Medicare Other | Source: Ambulatory Visit | Attending: Nephrology | Admitting: Nephrology

## 2017-12-27 VITALS — BP 123/69 | HR 65 | Temp 97.5°F | Ht 72.0 in | Wt 290.0 lb

## 2017-12-27 DIAGNOSIS — N184 Chronic kidney disease, stage 4 (severe): Secondary | ICD-10-CM

## 2017-12-27 DIAGNOSIS — N189 Chronic kidney disease, unspecified: Secondary | ICD-10-CM | POA: Diagnosis not present

## 2017-12-27 DIAGNOSIS — D631 Anemia in chronic kidney disease: Secondary | ICD-10-CM | POA: Insufficient documentation

## 2017-12-27 LAB — CBC WITH DIFFERENTIAL/PLATELET
ABS IMMATURE GRANULOCYTES: 0 10*3/uL (ref 0.0–0.1)
BASOS PCT: 0 %
Basophils Absolute: 0 10*3/uL (ref 0.0–0.1)
EOS PCT: 3 %
Eosinophils Absolute: 0.2 10*3/uL (ref 0.0–0.7)
HCT: 32.5 % — ABNORMAL LOW (ref 39.0–52.0)
HEMOGLOBIN: 9.7 g/dL — AB (ref 13.0–17.0)
Immature Granulocytes: 0 %
LYMPHS PCT: 34 %
Lymphs Abs: 2.2 10*3/uL (ref 0.7–4.0)
MCH: 26.9 pg (ref 26.0–34.0)
MCHC: 29.8 g/dL — AB (ref 30.0–36.0)
MCV: 90.3 fL (ref 78.0–100.0)
MONO ABS: 0.5 10*3/uL (ref 0.1–1.0)
MONOS PCT: 7 %
Neutro Abs: 3.7 10*3/uL (ref 1.7–7.7)
Neutrophils Relative %: 56 %
Platelets: 152 10*3/uL (ref 150–400)
RBC: 3.6 MIL/uL — ABNORMAL LOW (ref 4.22–5.81)
RDW: 16.1 % — ABNORMAL HIGH (ref 11.5–15.5)
WBC: 6.6 10*3/uL (ref 4.0–10.5)

## 2017-12-27 MED ORDER — DARBEPOETIN ALFA 40 MCG/0.4ML IJ SOSY
40.0000 ug | PREFILLED_SYRINGE | INTRAMUSCULAR | Status: DC
  Administered 2017-12-27: 40 ug via SUBCUTANEOUS

## 2017-12-27 MED ORDER — DARBEPOETIN ALFA 40 MCG/0.4ML IJ SOSY
PREFILLED_SYRINGE | INTRAMUSCULAR | Status: AC
  Administered 2017-12-27: 40 ug via SUBCUTANEOUS
  Filled 2017-12-27: qty 0.4

## 2017-12-27 NOTE — Telephone Encounter (Signed)
Dear [facility],  Mr. Bertrum Helmstetter (02-10-1951) has severe heart, vascular, and kidney disease. These problems cause shortness of breath, difficulty walking, and extreme fatigue. I request that he be excused from jury duty to avoid aggravating his underlying problems.   Respectfully,   Illene Labrador, III, MD

## 2017-12-27 NOTE — Telephone Encounter (Signed)
Pt's wife calling    Need to speak with you concerning pt's condition and she has some concerns and questions. Please call.

## 2017-12-27 NOTE — Telephone Encounter (Signed)
Wife states pt received a summons for jury duty for the first week of August.  She states pt is unable to do all the walking and sitting required for jury duty.  She said he would have to stop multiple times just to walk to and through the courthouse.  She wanted to know if we could write a letter stating that pt's heart condition prevents him from being able to participate in jury duty.  Advised I would send message to Dr. Tamala Julian for review and advisement.

## 2017-12-29 ENCOUNTER — Encounter: Payer: Self-pay | Admitting: *Deleted

## 2017-12-29 NOTE — Telephone Encounter (Signed)
Spoke with pt's wife and let her know that letter is ready.  Wife will pick up tomorrow.

## 2018-01-24 ENCOUNTER — Ambulatory Visit (HOSPITAL_COMMUNITY)
Admission: RE | Admit: 2018-01-24 | Discharge: 2018-01-24 | Disposition: A | Discharge status: Home / Self Care | Payer: Medicare Other | Source: Ambulatory Visit | Attending: Nephrology | Admitting: Nephrology

## 2018-01-24 VITALS — BP 120/69 | HR 78 | Temp 97.7°F

## 2018-01-24 DIAGNOSIS — Z79899 Other long term (current) drug therapy: Secondary | ICD-10-CM | POA: Insufficient documentation

## 2018-01-24 DIAGNOSIS — Z5181 Encounter for therapeutic drug level monitoring: Secondary | ICD-10-CM | POA: Diagnosis not present

## 2018-01-24 DIAGNOSIS — D631 Anemia in chronic kidney disease: Secondary | ICD-10-CM | POA: Insufficient documentation

## 2018-01-24 DIAGNOSIS — N184 Chronic kidney disease, stage 4 (severe): Secondary | ICD-10-CM | POA: Diagnosis not present

## 2018-01-24 LAB — CBC WITH DIFFERENTIAL/PLATELET
ABS IMMATURE GRANULOCYTES: 0 10*3/uL (ref 0.0–0.1)
Basophils Absolute: 0 10*3/uL (ref 0.0–0.1)
Basophils Relative: 0 %
Eosinophils Absolute: 0.2 10*3/uL (ref 0.0–0.7)
Eosinophils Relative: 3 %
HCT: 33.1 % — ABNORMAL LOW (ref 39.0–52.0)
HEMOGLOBIN: 10 g/dL — AB (ref 13.0–17.0)
IMMATURE GRANULOCYTES: 0 %
LYMPHS ABS: 2.2 10*3/uL (ref 0.7–4.0)
LYMPHS PCT: 36 %
MCH: 27.2 pg (ref 26.0–34.0)
MCHC: 30.2 g/dL (ref 30.0–36.0)
MCV: 89.9 fL (ref 78.0–100.0)
MONOS PCT: 8 %
Monocytes Absolute: 0.5 10*3/uL (ref 0.1–1.0)
NEUTROS ABS: 3.2 10*3/uL (ref 1.7–7.7)
NEUTROS PCT: 53 %
Platelets: 177 10*3/uL (ref 150–400)
RBC: 3.68 MIL/uL — ABNORMAL LOW (ref 4.22–5.81)
RDW: 16 % — ABNORMAL HIGH (ref 11.5–15.5)
WBC: 6.1 10*3/uL (ref 4.0–10.5)

## 2018-01-24 LAB — IRON AND TIBC
Iron: 42 ug/dL — ABNORMAL LOW (ref 45–182)
Saturation Ratios: 18 % (ref 17.9–39.5)
TIBC: 239 ug/dL — ABNORMAL LOW (ref 250–450)
UIBC: 197 ug/dL

## 2018-01-24 LAB — COMPREHENSIVE METABOLIC PANEL
ALK PHOS: 80 U/L (ref 38–126)
ALT: 14 U/L (ref 0–44)
AST: 16 U/L (ref 15–41)
Albumin: 3.7 g/dL (ref 3.5–5.0)
Anion gap: 7 (ref 5–15)
BUN: 48 mg/dL — AB (ref 8–23)
CALCIUM: 9.5 mg/dL (ref 8.9–10.3)
CO2: 23 mmol/L (ref 22–32)
CREATININE: 3.63 mg/dL — AB (ref 0.61–1.24)
Chloride: 112 mmol/L — ABNORMAL HIGH (ref 98–111)
GFR calc non Af Amer: 16 mL/min — ABNORMAL LOW (ref >60)
GFR, EST AFRICAN AMERICAN: 19 mL/min — AB (ref >60)
Glucose, Bld: 125 mg/dL — ABNORMAL HIGH (ref 70–99)
Potassium: 4 mmol/L (ref 3.5–5.1)
Sodium: 142 mmol/L (ref 135–145)
Total Bilirubin: 0.7 mg/dL (ref 0.3–1.2)
Total Protein: 6.8 g/dL (ref 6.5–8.1)

## 2018-01-24 LAB — FERRITIN: FERRITIN: 304 ng/mL (ref 24–336)

## 2018-01-24 LAB — PHOSPHORUS: Phosphorus: 3.7 mg/dL (ref 2.5–4.6)

## 2018-01-24 MED ORDER — DARBEPOETIN ALFA 40 MCG/0.4ML IJ SOSY: 40.0000 ug | PREFILLED_SYRINGE | INTRAMUSCULAR | Status: DC

## 2018-01-24 MED ORDER — DARBEPOETIN ALFA 40 MCG/0.4ML IJ SOSY
PREFILLED_SYRINGE | INTRAMUSCULAR | Status: AC
  Administered 2018-01-24: 40 ug
  Filled 2018-01-24: qty 0.4

## 2018-02-20 ENCOUNTER — Other Ambulatory Visit (HOSPITAL_COMMUNITY): Payer: Self-pay | Admitting: *Deleted

## 2018-02-21 ENCOUNTER — Ambulatory Visit (HOSPITAL_COMMUNITY)
Admission: RE | Admit: 2018-02-21 | Discharge: 2018-02-21 | Disposition: A | Discharge status: Home / Self Care | Payer: Medicare Other | Source: Ambulatory Visit | Attending: Nephrology | Admitting: Nephrology

## 2018-02-21 VITALS — BP 119/64 | HR 61 | Temp 98.6°F | Resp 20 | Ht 72.0 in | Wt 295.0 lb

## 2018-02-21 DIAGNOSIS — N184 Chronic kidney disease, stage 4 (severe): Secondary | ICD-10-CM | POA: Diagnosis not present

## 2018-02-21 DIAGNOSIS — D631 Anemia in chronic kidney disease: Secondary | ICD-10-CM | POA: Insufficient documentation

## 2018-02-21 LAB — CBC WITH DIFFERENTIAL/PLATELET
Abs Immature Granulocytes: 0 10*3/uL (ref 0.0–0.1)
BASOS ABS: 0 10*3/uL (ref 0.0–0.1)
Basophils Relative: 0 %
EOS ABS: 0.2 10*3/uL (ref 0.0–0.7)
EOS PCT: 3 %
HCT: 34.3 % — ABNORMAL LOW (ref 39.0–52.0)
HEMOGLOBIN: 10.4 g/dL — AB (ref 13.0–17.0)
IMMATURE GRANULOCYTES: 0 %
LYMPHS ABS: 2 10*3/uL (ref 0.7–4.0)
LYMPHS PCT: 32 %
MCH: 26.8 pg (ref 26.0–34.0)
MCHC: 30.3 g/dL (ref 30.0–36.0)
MCV: 88.4 fL (ref 78.0–100.0)
Monocytes Absolute: 0.6 10*3/uL (ref 0.1–1.0)
Monocytes Relative: 9 %
NEUTROS PCT: 56 %
Neutro Abs: 3.5 10*3/uL (ref 1.7–7.7)
Platelets: 161 10*3/uL (ref 150–400)
RBC: 3.88 MIL/uL — AB (ref 4.22–5.81)
RDW: 16.2 % — AB (ref 11.5–15.5)
WBC: 6.3 10*3/uL (ref 4.0–10.5)

## 2018-02-21 MED ORDER — DARBEPOETIN ALFA 40 MCG/0.4ML IJ SOSY: 40.0000 ug | PREFILLED_SYRINGE | INTRAMUSCULAR | Status: DC

## 2018-02-21 MED ORDER — SODIUM CHLORIDE 0.9 % IV SOLN
510.0000 mg | INTRAVENOUS | Status: DC
  Administered 2018-02-21: 510 mg via INTRAVENOUS
  Filled 2018-02-21: qty 17

## 2018-02-21 MED ORDER — DARBEPOETIN ALFA 40 MCG/0.4ML IJ SOSY
PREFILLED_SYRINGE | INTRAMUSCULAR | Status: AC
  Administered 2018-02-21: 40 ug
  Filled 2018-02-21: qty 0.4

## 2018-02-21 MED ORDER — CLONIDINE HCL 0.1 MG PO TABS: 0.1000 mg | ORAL_TABLET | Freq: Once | ORAL | Status: DC | PRN

## 2018-02-28 ENCOUNTER — Ambulatory Visit (HOSPITAL_COMMUNITY)
Admission: RE | Admit: 2018-02-28 | Discharge: 2018-02-28 | Disposition: A | Discharge status: Home / Self Care | Payer: Medicare Other | Source: Ambulatory Visit | Attending: Nephrology | Admitting: Nephrology

## 2018-02-28 DIAGNOSIS — N184 Chronic kidney disease, stage 4 (severe): Secondary | ICD-10-CM | POA: Insufficient documentation

## 2018-02-28 DIAGNOSIS — D631 Anemia in chronic kidney disease: Secondary | ICD-10-CM | POA: Diagnosis present

## 2018-02-28 MED ORDER — SODIUM CHLORIDE 0.9 % IV SOLN
510.0000 mg | INTRAVENOUS | Status: AC
  Administered 2018-02-28: 510 mg via INTRAVENOUS
  Filled 2018-02-28: qty 17

## 2018-03-07 ENCOUNTER — Ambulatory Visit (INDEPENDENT_AMBULATORY_CARE_PROVIDER_SITE_OTHER): Payer: Medicare Other | Admitting: *Deleted

## 2018-03-07 DIAGNOSIS — I255 Ischemic cardiomyopathy: Secondary | ICD-10-CM | POA: Diagnosis not present

## 2018-03-07 DIAGNOSIS — I5022 Chronic systolic (congestive) heart failure: Secondary | ICD-10-CM

## 2018-03-07 NOTE — Progress Notes (Signed)
Remote ICD transmission.   

## 2018-03-08 ENCOUNTER — Encounter: Payer: Self-pay | Admitting: Cardiology

## 2018-03-15 LAB — CUP PACEART REMOTE DEVICE CHECK
Battery Remaining Longevity: 74 mo
Brady Statistic RA Percent Paced: 73.69 %
Brady Statistic RV Percent Paced: 96.15 %
HighPow Impedance: 55 Ohm
Implantable Lead Implant Date: 20171212
Implantable Lead Implant Date: 20171212
Implantable Lead Location: 753858
Implantable Lead Location: 753859
Implantable Lead Location: 753860
Implantable Lead Model: 4298
Implantable Lead Model: 5076
Implantable Pulse Generator Implant Date: 20171212
Lead Channel Impedance Value: 114 Ohm
Lead Channel Impedance Value: 118.56 Ohm
Lead Channel Impedance Value: 118.56 Ohm
Lead Channel Impedance Value: 228 Ohm
Lead Channel Impedance Value: 228 Ohm
Lead Channel Impedance Value: 247 Ohm
Lead Channel Impedance Value: 247 Ohm
Lead Channel Impedance Value: 285 Ohm
Lead Channel Impedance Value: 342 Ohm
Lead Channel Impedance Value: 342 Ohm
Lead Channel Impedance Value: 399 Ohm
Lead Channel Impedance Value: 399 Ohm
Lead Channel Impedance Value: 418 Ohm
Lead Channel Impedance Value: 456 Ohm
Lead Channel Pacing Threshold Amplitude: 0.5 V
Lead Channel Pacing Threshold Amplitude: 0.625 V
Lead Channel Pacing Threshold Amplitude: 0.75 V
Lead Channel Pacing Threshold Pulse Width: 0.4 ms
Lead Channel Pacing Threshold Pulse Width: 0.4 ms
Lead Channel Pacing Threshold Pulse Width: 0.4 ms
Lead Channel Sensing Intrinsic Amplitude: 18.875 mV
Lead Channel Sensing Intrinsic Amplitude: 4.5 mV
Lead Channel Sensing Intrinsic Amplitude: 4.5 mV
Lead Channel Setting Pacing Amplitude: 1 V
Lead Channel Setting Pacing Amplitude: 2 V
Lead Channel Setting Pacing Amplitude: 2.5 V
Lead Channel Setting Pacing Pulse Width: 0.4 ms
Lead Channel Setting Sensing Sensitivity: 0.3 mV
MDC IDC LEAD IMPLANT DT: 20171212
MDC IDC MSMT BATTERY VOLTAGE: 2.98 V
MDC IDC MSMT LEADCHNL LV IMPEDANCE VALUE: 118.56 Ohm
MDC IDC MSMT LEADCHNL LV IMPEDANCE VALUE: 123.5 Ohm
MDC IDC MSMT LEADCHNL LV IMPEDANCE VALUE: 418 Ohm
MDC IDC MSMT LEADCHNL LV IMPEDANCE VALUE: 418 Ohm
MDC IDC MSMT LEADCHNL RV SENSING INTR AMPL: 18.875 mV
MDC IDC SESS DTM: 20190924041703
MDC IDC SET LEADCHNL LV PACING PULSEWIDTH: 0.4 ms
MDC IDC STAT BRADY AP VP PERCENT: 74.24 %
MDC IDC STAT BRADY AP VS PERCENT: 0.51 %
MDC IDC STAT BRADY AS VP PERCENT: 24.33 %
MDC IDC STAT BRADY AS VS PERCENT: 0.92 %

## 2018-03-21 ENCOUNTER — Ambulatory Visit (HOSPITAL_COMMUNITY)
Admission: RE | Admit: 2018-03-21 | Discharge: 2018-03-21 | Disposition: A | Discharge status: Home / Self Care | Payer: Medicare Other | Source: Ambulatory Visit | Attending: Nephrology | Admitting: Nephrology

## 2018-03-21 VITALS — BP 134/75 | HR 71 | Temp 97.6°F | Resp 20

## 2018-03-21 DIAGNOSIS — D631 Anemia in chronic kidney disease: Secondary | ICD-10-CM | POA: Insufficient documentation

## 2018-03-21 DIAGNOSIS — N184 Chronic kidney disease, stage 4 (severe): Secondary | ICD-10-CM

## 2018-03-21 DIAGNOSIS — N189 Chronic kidney disease, unspecified: Secondary | ICD-10-CM | POA: Insufficient documentation

## 2018-03-21 LAB — CBC WITH DIFFERENTIAL/PLATELET
Abs Immature Granulocytes: 0.03 10*3/uL (ref 0.00–0.07)
BASOS PCT: 0 %
Basophils Absolute: 0 10*3/uL (ref 0.0–0.1)
EOS ABS: 0.2 10*3/uL (ref 0.0–0.5)
EOS PCT: 3 %
HCT: 36.3 % — ABNORMAL LOW (ref 39.0–52.0)
Hemoglobin: 11.1 g/dL — ABNORMAL LOW (ref 13.0–17.0)
Immature Granulocytes: 0 %
Lymphocytes Relative: 34 %
Lymphs Abs: 2.3 10*3/uL (ref 0.7–4.0)
MCH: 26.7 pg (ref 26.0–34.0)
MCHC: 30.6 g/dL (ref 30.0–36.0)
MCV: 87.3 fL (ref 80.0–100.0)
MONO ABS: 0.6 10*3/uL (ref 0.1–1.0)
Monocytes Relative: 9 %
Neutro Abs: 3.6 10*3/uL (ref 1.7–7.7)
Neutrophils Relative %: 54 %
PLATELETS: 158 10*3/uL (ref 150–400)
RBC: 4.16 MIL/uL — AB (ref 4.22–5.81)
RDW: 16.2 % — ABNORMAL HIGH (ref 11.5–15.5)
WBC: 6.7 10*3/uL (ref 4.0–10.5)
nRBC: 0 % (ref 0.0–0.2)

## 2018-03-21 LAB — COMPREHENSIVE METABOLIC PANEL
ALT: 17 U/L (ref 0–44)
ANION GAP: 10 (ref 5–15)
AST: 15 U/L (ref 15–41)
Albumin: 3.8 g/dL (ref 3.5–5.0)
Alkaline Phosphatase: 84 U/L (ref 38–126)
BUN: 73 mg/dL — ABNORMAL HIGH (ref 8–23)
CALCIUM: 9.9 mg/dL (ref 8.9–10.3)
CHLORIDE: 107 mmol/L (ref 98–111)
CO2: 21 mmol/L — AB (ref 22–32)
Creatinine, Ser: 4.61 mg/dL — ABNORMAL HIGH (ref 0.61–1.24)
GFR, EST AFRICAN AMERICAN: 14 mL/min — AB (ref >60)
GFR, EST NON AFRICAN AMERICAN: 12 mL/min — AB (ref >60)
Glucose, Bld: 266 mg/dL — ABNORMAL HIGH (ref 70–99)
Potassium: 4.1 mmol/L (ref 3.5–5.1)
SODIUM: 138 mmol/L (ref 135–145)
Total Bilirubin: 0.6 mg/dL (ref 0.3–1.2)
Total Protein: 7 g/dL (ref 6.5–8.1)

## 2018-03-21 LAB — IRON AND TIBC
IRON: 62 ug/dL (ref 45–182)
SATURATION RATIOS: 26 % (ref 17.9–39.5)
TIBC: 237 ug/dL — ABNORMAL LOW (ref 250–450)
UIBC: 175 ug/dL

## 2018-03-21 LAB — FERRITIN: Ferritin: 626 ng/mL — ABNORMAL HIGH (ref 24–336)

## 2018-03-21 LAB — PHOSPHORUS: PHOSPHORUS: 4.3 mg/dL (ref 2.5–4.6)

## 2018-03-21 MED ORDER — DARBEPOETIN ALFA 40 MCG/0.4ML IJ SOSY
PREFILLED_SYRINGE | INTRAMUSCULAR | Status: AC
  Filled 2018-03-21: qty 0.4

## 2018-03-21 MED ORDER — DARBEPOETIN ALFA 40 MCG/0.4ML IJ SOSY
40.0000 ug | PREFILLED_SYRINGE | INTRAMUSCULAR | Status: DC
  Administered 2018-03-21: 40 ug via SUBCUTANEOUS

## 2018-04-18 ENCOUNTER — Ambulatory Visit (HOSPITAL_COMMUNITY)
Admission: RE | Admit: 2018-04-18 | Discharge: 2018-04-18 | Disposition: A | Discharge status: Home / Self Care | Payer: Medicare Other | Source: Ambulatory Visit | Attending: Nephrology | Admitting: Nephrology

## 2018-04-18 VITALS — BP 126/64 | HR 64 | Temp 97.3°F | Resp 20

## 2018-04-18 DIAGNOSIS — N184 Chronic kidney disease, stage 4 (severe): Secondary | ICD-10-CM | POA: Diagnosis not present

## 2018-04-18 LAB — CBC WITH DIFFERENTIAL/PLATELET
ABS IMMATURE GRANULOCYTES: 0.02 10*3/uL (ref 0.00–0.07)
BASOS ABS: 0 10*3/uL (ref 0.0–0.1)
Basophils Relative: 0 %
EOS ABS: 0.2 10*3/uL (ref 0.0–0.5)
Eosinophils Relative: 2 %
HEMATOCRIT: 34.2 % — AB (ref 39.0–52.0)
Hemoglobin: 10.3 g/dL — ABNORMAL LOW (ref 13.0–17.0)
IMMATURE GRANULOCYTES: 0 %
LYMPHS ABS: 2.3 10*3/uL (ref 0.7–4.0)
Lymphocytes Relative: 36 %
MCH: 26 pg (ref 26.0–34.0)
MCHC: 30.1 g/dL (ref 30.0–36.0)
MCV: 86.4 fL (ref 80.0–100.0)
Monocytes Absolute: 0.6 10*3/uL (ref 0.1–1.0)
Monocytes Relative: 9 %
NEUTROS PCT: 53 %
Neutro Abs: 3.3 10*3/uL (ref 1.7–7.7)
PLATELETS: 144 10*3/uL — AB (ref 150–400)
RBC: 3.96 MIL/uL — ABNORMAL LOW (ref 4.22–5.81)
RDW: 15.8 % — ABNORMAL HIGH (ref 11.5–15.5)
WBC: 6.3 10*3/uL (ref 4.0–10.5)
nRBC: 0 % (ref 0.0–0.2)

## 2018-04-18 MED ORDER — DARBEPOETIN ALFA 40 MCG/0.4ML IJ SOSY
40.0000 ug | PREFILLED_SYRINGE | INTRAMUSCULAR | Status: DC
  Administered 2018-04-18: 40 ug via SUBCUTANEOUS

## 2018-04-18 MED ORDER — DARBEPOETIN ALFA 40 MCG/0.4ML IJ SOSY
PREFILLED_SYRINGE | INTRAMUSCULAR | Status: AC
  Filled 2018-04-18: qty 0.4

## 2018-05-15 ENCOUNTER — Other Ambulatory Visit (HOSPITAL_COMMUNITY): Payer: Self-pay

## 2018-05-16 ENCOUNTER — Ambulatory Visit (HOSPITAL_COMMUNITY)
Admission: RE | Admit: 2018-05-16 | Discharge: 2018-05-16 | Disposition: A | Discharge status: Home / Self Care | Payer: Medicare Other | Source: Ambulatory Visit | Attending: Nephrology | Admitting: Nephrology

## 2018-05-16 VITALS — BP 122/70 | HR 63 | Temp 98.0°F | Resp 20

## 2018-05-16 DIAGNOSIS — N184 Chronic kidney disease, stage 4 (severe): Secondary | ICD-10-CM | POA: Diagnosis not present

## 2018-05-16 DIAGNOSIS — D631 Anemia in chronic kidney disease: Secondary | ICD-10-CM | POA: Insufficient documentation

## 2018-05-16 LAB — COMPREHENSIVE METABOLIC PANEL
ALT: 18 U/L (ref 0–44)
ANION GAP: 11 (ref 5–15)
AST: 16 U/L (ref 15–41)
Albumin: 3.7 g/dL (ref 3.5–5.0)
Alkaline Phosphatase: 91 U/L (ref 38–126)
BILIRUBIN TOTAL: 0.5 mg/dL (ref 0.3–1.2)
BUN: 78 mg/dL — ABNORMAL HIGH (ref 8–23)
CO2: 21 mmol/L — ABNORMAL LOW (ref 22–32)
Calcium: 9.8 mg/dL (ref 8.9–10.3)
Chloride: 102 mmol/L (ref 98–111)
Creatinine, Ser: 4.7 mg/dL — ABNORMAL HIGH (ref 0.61–1.24)
GFR, EST AFRICAN AMERICAN: 14 mL/min — AB (ref >60)
GFR, EST NON AFRICAN AMERICAN: 12 mL/min — AB (ref >60)
GLUCOSE: 343 mg/dL — AB (ref 70–99)
POTASSIUM: 4.3 mmol/L (ref 3.5–5.1)
Sodium: 134 mmol/L — ABNORMAL LOW (ref 135–145)
TOTAL PROTEIN: 7.1 g/dL (ref 6.5–8.1)

## 2018-05-16 LAB — CBC WITH DIFFERENTIAL/PLATELET
Abs Immature Granulocytes: 0.03 10*3/uL (ref 0.00–0.07)
Basophils Absolute: 0 10*3/uL (ref 0.0–0.1)
Basophils Relative: 0 %
EOS ABS: 0.2 10*3/uL (ref 0.0–0.5)
EOS PCT: 3 %
HCT: 34.9 % — ABNORMAL LOW (ref 39.0–52.0)
Hemoglobin: 10.6 g/dL — ABNORMAL LOW (ref 13.0–17.0)
Immature Granulocytes: 1 %
LYMPHS ABS: 2.3 10*3/uL (ref 0.7–4.0)
Lymphocytes Relative: 35 %
MCH: 26.3 pg (ref 26.0–34.0)
MCHC: 30.4 g/dL (ref 30.0–36.0)
MCV: 86.6 fL (ref 80.0–100.0)
MONO ABS: 0.5 10*3/uL (ref 0.1–1.0)
Monocytes Relative: 7 %
Neutro Abs: 3.5 10*3/uL (ref 1.7–7.7)
Neutrophils Relative %: 54 %
Platelets: 162 10*3/uL (ref 150–400)
RBC: 4.03 MIL/uL — ABNORMAL LOW (ref 4.22–5.81)
RDW: 15.9 % — AB (ref 11.5–15.5)
WBC: 6.5 10*3/uL (ref 4.0–10.5)
nRBC: 0 % (ref 0.0–0.2)

## 2018-05-16 LAB — FERRITIN: FERRITIN: 659 ng/mL — AB (ref 24–336)

## 2018-05-16 LAB — PHOSPHORUS: PHOSPHORUS: 4.4 mg/dL (ref 2.5–4.6)

## 2018-05-16 LAB — IRON AND TIBC
IRON: 60 ug/dL (ref 45–182)
SATURATION RATIOS: 26 % (ref 17.9–39.5)
TIBC: 232 ug/dL — ABNORMAL LOW (ref 250–450)
UIBC: 172 ug/dL

## 2018-05-16 MED ORDER — DARBEPOETIN ALFA 40 MCG/0.4ML IJ SOSY
40.0000 ug | PREFILLED_SYRINGE | INTRAMUSCULAR | Status: DC
  Administered 2018-05-16: 40 ug via SUBCUTANEOUS

## 2018-05-16 MED ORDER — DARBEPOETIN ALFA 40 MCG/0.4ML IJ SOSY
PREFILLED_SYRINGE | INTRAMUSCULAR | Status: AC
  Filled 2018-05-16: qty 0.4

## 2018-06-01 ENCOUNTER — Telehealth: Payer: Self-pay | Admitting: Interventional Cardiology

## 2018-06-01 NOTE — Telephone Encounter (Signed)
New Message        Patient's wife is calling today to get a c-pap machine mask, his is broken and he is not sleeping.

## 2018-06-02 NOTE — Telephone Encounter (Signed)
Called patient to offer assistance and he asked me to speak to his wife. Called his wife [email or web address].

## 2018-06-06 ENCOUNTER — Ambulatory Visit (INDEPENDENT_AMBULATORY_CARE_PROVIDER_SITE_OTHER): Payer: Medicare Other

## 2018-06-06 DIAGNOSIS — I255 Ischemic cardiomyopathy: Secondary | ICD-10-CM

## 2018-06-07 LAB — CUP PACEART REMOTE DEVICE CHECK
Brady Statistic AP VS Percent: 0.38 %
Brady Statistic AS VP Percent: 24.21 %
Brady Statistic RA Percent Paced: 74.35 %
HIGH POWER IMPEDANCE MEASURED VALUE: 60 Ohm
Implantable Lead Implant Date: 20171212
Implantable Lead Location: 753859
Lead Channel Impedance Value: 126.667
Lead Channel Impedance Value: 132.321
Lead Channel Impedance Value: 142.5 Ohm
Lead Channel Impedance Value: 228 Ohm
Lead Channel Impedance Value: 247 Ohm
Lead Channel Impedance Value: 247 Ohm
Lead Channel Impedance Value: 342 Ohm
Lead Channel Impedance Value: 361 Ohm
Lead Channel Impedance Value: 456 Ohm
Lead Channel Impedance Value: 456 Ohm
Lead Channel Pacing Threshold Pulse Width: 0.4 ms
Lead Channel Sensing Intrinsic Amplitude: 17.625 mV
Lead Channel Sensing Intrinsic Amplitude: 17.625 mV
Lead Channel Setting Pacing Amplitude: 1.25 V
Lead Channel Setting Pacing Amplitude: 2.5 V
Lead Channel Setting Pacing Pulse Width: 0.4 ms
Lead Channel Setting Pacing Pulse Width: 0.4 ms
MDC IDC LEAD IMPLANT DT: 20171212
MDC IDC LEAD IMPLANT DT: 20171212
MDC IDC LEAD LOCATION: 753858
MDC IDC LEAD LOCATION: 753860
MDC IDC MSMT BATTERY REMAINING LONGEVITY: 67 mo
MDC IDC MSMT BATTERY VOLTAGE: 2.98 V
MDC IDC MSMT LEADCHNL LV IMPEDANCE VALUE: 118.56 Ohm
MDC IDC MSMT LEADCHNL LV IMPEDANCE VALUE: 126.667
MDC IDC MSMT LEADCHNL LV IMPEDANCE VALUE: 285 Ohm
MDC IDC MSMT LEADCHNL LV IMPEDANCE VALUE: 285 Ohm
MDC IDC MSMT LEADCHNL LV IMPEDANCE VALUE: 399 Ohm
MDC IDC MSMT LEADCHNL LV IMPEDANCE VALUE: 418 Ohm
MDC IDC MSMT LEADCHNL LV IMPEDANCE VALUE: 418 Ohm
MDC IDC MSMT LEADCHNL LV PACING THRESHOLD AMPLITUDE: 0.625 V
MDC IDC MSMT LEADCHNL RA IMPEDANCE VALUE: 418 Ohm
MDC IDC MSMT LEADCHNL RA PACING THRESHOLD AMPLITUDE: 0.625 V
MDC IDC MSMT LEADCHNL RA PACING THRESHOLD PULSEWIDTH: 0.4 ms
MDC IDC MSMT LEADCHNL RA SENSING INTR AMPL: 4.375 mV
MDC IDC MSMT LEADCHNL RA SENSING INTR AMPL: 4.375 mV
MDC IDC MSMT LEADCHNL RV PACING THRESHOLD AMPLITUDE: 0.75 V
MDC IDC MSMT LEADCHNL RV PACING THRESHOLD PULSEWIDTH: 0.4 ms
MDC IDC PG IMPLANT DT: 20171212
MDC IDC SESS DTM: 20191224062304
MDC IDC SET LEADCHNL RA PACING AMPLITUDE: 2 V
MDC IDC SET LEADCHNL RV SENSING SENSITIVITY: 0.3 mV
MDC IDC STAT BRADY AP VP PERCENT: 74.7 %
MDC IDC STAT BRADY AS VS PERCENT: 0.7 %
MDC IDC STAT BRADY RV PERCENT PACED: 97.23 %

## 2018-06-08 NOTE — Progress Notes (Signed)
Remote ICD transmission.   

## 2018-06-13 ENCOUNTER — Ambulatory Visit (HOSPITAL_COMMUNITY)
Admission: RE | Admit: 2018-06-13 | Discharge: 2018-06-13 | Disposition: A | Discharge status: Home / Self Care | Payer: Medicare Other | Source: Ambulatory Visit | Attending: Nephrology | Admitting: Nephrology

## 2018-06-13 VITALS — BP 113/61 | HR 62 | Temp 98.4°F | Resp 20

## 2018-06-13 DIAGNOSIS — N184 Chronic kidney disease, stage 4 (severe): Secondary | ICD-10-CM | POA: Diagnosis not present

## 2018-06-13 LAB — CBC WITH DIFFERENTIAL/PLATELET
Abs Immature Granulocytes: 0.01 10*3/uL (ref 0.00–0.07)
Basophils Absolute: 0 10*3/uL (ref 0.0–0.1)
Basophils Relative: 0 %
Eosinophils Absolute: 0.2 10*3/uL (ref 0.0–0.5)
Eosinophils Relative: 3 %
HCT: 33.4 % — ABNORMAL LOW (ref 39.0–52.0)
HEMOGLOBIN: 10.2 g/dL — AB (ref 13.0–17.0)
Immature Granulocytes: 0 %
LYMPHS ABS: 2.1 10*3/uL (ref 0.7–4.0)
Lymphocytes Relative: 33 %
MCH: 26.8 pg (ref 26.0–34.0)
MCHC: 30.5 g/dL (ref 30.0–36.0)
MCV: 87.9 fL (ref 80.0–100.0)
Monocytes Absolute: 0.6 10*3/uL (ref 0.1–1.0)
Monocytes Relative: 9 %
Neutro Abs: 3.5 10*3/uL (ref 1.7–7.7)
Neutrophils Relative %: 55 %
Platelets: 151 10*3/uL (ref 150–400)
RBC: 3.8 MIL/uL — ABNORMAL LOW (ref 4.22–5.81)
RDW: 16.5 % — ABNORMAL HIGH (ref 11.5–15.5)
WBC: 6.4 10*3/uL (ref 4.0–10.5)
nRBC: 0 % (ref 0.0–0.2)

## 2018-06-13 LAB — POCT HEMOGLOBIN-HEMACUE: Hemoglobin: 10.3 g/dL — ABNORMAL LOW (ref 13.0–17.0)

## 2018-06-13 MED ORDER — DARBEPOETIN ALFA 40 MCG/0.4ML IJ SOSY
40.0000 ug | PREFILLED_SYRINGE | INTRAMUSCULAR | Status: DC
  Administered 2018-06-13: 40 ug via SUBCUTANEOUS

## 2018-06-13 MED ORDER — DARBEPOETIN ALFA 40 MCG/0.4ML IJ SOSY
PREFILLED_SYRINGE | INTRAMUSCULAR | Status: AC
  Filled 2018-06-13: qty 0.4

## 2018-06-16 NOTE — Telephone Encounter (Signed)
Reached back out to patients wife per patient request to offer assistance and she said the problem had been resolved. Wife states his mask is not broken and thanked me for calling.

## 2018-06-21 ENCOUNTER — Encounter (HOSPITAL_COMMUNITY): Payer: Medicare Other

## 2018-07-11 ENCOUNTER — Ambulatory Visit (HOSPITAL_COMMUNITY)
Admission: RE | Admit: 2018-07-11 | Discharge: 2018-07-11 | Disposition: A | Discharge status: Home / Self Care | Payer: Medicare Other | Source: Ambulatory Visit | Attending: Nephrology | Admitting: Nephrology

## 2018-07-11 VITALS — BP 126/69 | HR 71 | Resp 20

## 2018-07-11 DIAGNOSIS — N184 Chronic kidney disease, stage 4 (severe): Secondary | ICD-10-CM | POA: Diagnosis not present

## 2018-07-11 LAB — COMPREHENSIVE METABOLIC PANEL
ALK PHOS: 65 U/L (ref 38–126)
ALT: 17 U/L (ref 0–44)
AST: 16 U/L (ref 15–41)
Albumin: 3.7 g/dL (ref 3.5–5.0)
Anion gap: 8 (ref 5–15)
BUN: 69 mg/dL — AB (ref 8–23)
CO2: 19 mmol/L — ABNORMAL LOW (ref 22–32)
Calcium: 9.9 mg/dL (ref 8.9–10.3)
Chloride: 113 mmol/L — ABNORMAL HIGH (ref 98–111)
Creatinine, Ser: 4.18 mg/dL — ABNORMAL HIGH (ref 0.61–1.24)
GFR calc Af Amer: 16 mL/min — ABNORMAL LOW (ref >60)
GFR calc non Af Amer: 14 mL/min — ABNORMAL LOW (ref >60)
Glucose, Bld: 114 mg/dL — ABNORMAL HIGH (ref 70–99)
Potassium: 4.2 mmol/L (ref 3.5–5.1)
Sodium: 140 mmol/L (ref 135–145)
Total Bilirubin: 0.6 mg/dL (ref 0.3–1.2)
Total Protein: 7 g/dL (ref 6.5–8.1)

## 2018-07-11 LAB — IRON AND TIBC
Iron: 54 ug/dL (ref 45–182)
Saturation Ratios: 22 % (ref 17.9–39.5)
TIBC: 246 ug/dL — ABNORMAL LOW (ref 250–450)
UIBC: 192 ug/dL

## 2018-07-11 LAB — PHOSPHORUS: Phosphorus: 3.5 mg/dL (ref 2.5–4.6)

## 2018-07-11 LAB — FERRITIN: Ferritin: 550 ng/mL — ABNORMAL HIGH (ref 24–336)

## 2018-07-11 LAB — POCT HEMOGLOBIN-HEMACUE: Hemoglobin: 10.5 g/dL — ABNORMAL LOW (ref 13.0–17.0)

## 2018-07-11 MED ORDER — DARBEPOETIN ALFA 40 MCG/0.4ML IJ SOSY
40.0000 ug | PREFILLED_SYRINGE | INTRAMUSCULAR | Status: DC
  Administered 2018-07-11: 40 ug via SUBCUTANEOUS

## 2018-07-11 MED ORDER — DARBEPOETIN ALFA 40 MCG/0.4ML IJ SOSY
PREFILLED_SYRINGE | INTRAMUSCULAR | Status: AC
  Filled 2018-07-11: qty 0.4

## 2018-07-20 ENCOUNTER — Ambulatory Visit (INDEPENDENT_AMBULATORY_CARE_PROVIDER_SITE_OTHER): Payer: Medicare Other | Admitting: Interventional Cardiology

## 2018-07-20 ENCOUNTER — Encounter: Payer: Self-pay | Admitting: Interventional Cardiology

## 2018-07-20 VITALS — BP 118/72 | HR 74 | Ht 71.0 in | Wt 307.2 lb

## 2018-07-20 DIAGNOSIS — Z95810 Presence of automatic (implantable) cardiac defibrillator: Secondary | ICD-10-CM

## 2018-07-20 DIAGNOSIS — E1121 Type 2 diabetes mellitus with diabetic nephropathy: Secondary | ICD-10-CM

## 2018-07-20 DIAGNOSIS — N184 Chronic kidney disease, stage 4 (severe): Secondary | ICD-10-CM | POA: Diagnosis not present

## 2018-07-20 DIAGNOSIS — I5022 Chronic systolic (congestive) heart failure: Secondary | ICD-10-CM

## 2018-07-20 DIAGNOSIS — I2510 Atherosclerotic heart disease of native coronary artery without angina pectoris: Secondary | ICD-10-CM

## 2018-07-20 DIAGNOSIS — G4733 Obstructive sleep apnea (adult) (pediatric): Secondary | ICD-10-CM

## 2018-07-20 DIAGNOSIS — I10 Essential (primary) hypertension: Secondary | ICD-10-CM | POA: Diagnosis not present

## 2018-07-20 NOTE — Patient Instructions (Signed)
Medication Instructions:  Your physician recommends that you continue on your current medications as directed. Please refer to the Current Medication list given to you today.  If you need a refill on your cardiac medications before your next appointment, please call your pharmacy.   Lab work: None Ordered    Testing/Procedures: Your physician has recommended that you have a sleep study. This test records several body functions during sleep, including: brain activity, eye movement, oxygen and carbon dioxide blood levels, heart rate and rhythm, breathing rate and rhythm, the flow of air through your mouth and nose, snoring, body muscle movements, and chest and belly movement.    Follow-Up: At [facility], you and your health needs are our priority.  As part of our continuing mission to provide you with exceptional heart care, we have created designated Provider Care Teams.  These Care Teams include your primary Cardiologist (physician) and Advanced Practice Providers (APPs -  Physician Assistants and Nurse Practitioners) who all work together to provide you with the care you need, when you need it. You will need a follow up appointment in 6 months.  Please call our office 2 months in advance to schedule this appointment.  You may see Sinclair Grooms, MD or one of the following Advanced Practice Providers on your designated Care Team:   Truitt Merle, NP Cecilie Kicks, NP . Kathyrn Drown, NP

## 2018-07-20 NOTE — Progress Notes (Signed)
Cardiology Office Note:    Date:  07/20/2018   ID:  Matthew Wood, DOB 1951/06/04, MRN [id number]  PCP:  Seward Carol, MD  Cardiologist:  Sinclair Grooms, MD   Referring MD: Seward Carol, MD   Chief Complaint  Patient presents with  . Coronary Artery Disease  . Congestive Heart Failure    History of Present Illness:    Matthew Wood is a 68 y.o. male with a hx of CAD, chronic combined systolic and diastolic heart failure, HTN, HLD, LBBB, multinodular goiter, OSA on CPAP, DM, CVA and CKD stage III-IV. Recently diagnosed to have ejection fraction by echo in the 30-35% range in the setting of ischemic cardiomyopathy and CRT-D implanted 05/2016.  Recent decompensation led to addition of metolazone.  Clester still sleeps in a recliner.  He is short of breath and unable to lie flat.  He has significant lower extremity swelling.  Diuretics are being managed by Dr.Coladonato.  Creatinine is in the 4.2 range.  He has been going to transplant classes at [facility].  He does not have dialysis access yet as kidney function has stabilized.  He is on furosemide 80 mg daily.  Metolazone 2.5 mg is used once or twice weekly.  He has moderate to severe bilateral lower extremity edema.  He has sleep apnea that has not been reassessed in greater than 8 years.  Equipment is dysfunctional.  Does not have a Sims card to download information.  He denies chest pain, syncope, palpitations.  And transient neurological complaints.  Apparently compliant with medical regimen although looks to his wife whenever questions asked because she apparently prepares medications and he just takes them.  Past Medical History:  Diagnosis Date  . Anemia   . CHF (congestive heart failure) ([name])   . Coronary artery disease    a. LHC 04/27/10 left main patent, 30-40% LAD, mid circumflex 30%, 80% mid RCA accounting for the appearance of an infract/peri-infract ischemia on nuclear testing, LV EF of 30-45%--> medical therapy    . CVA (cerebrovascular accident) ([name]) 2011   potine; "little balance problems since" (05/25/2016)  . Dyspnea   . Gout   . History of kidney stones    "passed them" (05/25/2016)  . Hyperlipidemia   . Hypertension   . LBBB (left bundle branch block)   . Multinodular goiter   . Obesity   . OSA on CPAP   . Presence of permanent cardiac pacemaker   . Renal insufficiency   . Type II diabetes mellitus ([name])     Past Surgical History:  Procedure Laterality Date  . COLONOSCOPY WITH PROPOFOL N/A 04/16/2014   Procedure: COLONOSCOPY WITH PROPOFOL;  Surgeon: [name], MD;  Location: [facility];  Service: Endoscopy;  Laterality: N/A;  . EP IMPLANTABLE DEVICE N/A 05/25/2016   Procedure: BiV ICD Insertion CRT-D;  Surgeon: [name], MD;  Location: [facility];  Service: Cardiovascular;  Laterality: N/A;  . INSERT / REPLACE / REMOVE PACEMAKER  05/25/2016   biventricular pacemaker  . LAPAROSCOPIC CHOLECYSTECTOMY      Current Medications: Current Meds  Medication Sig  . allopurinol (ZYLOPRIM) 300 MG tablet Take 300 mg by mouth daily.  Marland Kitchen amLODipine-benazepril (LOTREL) 10-40 MG per capsule Take 1 capsule by mouth every morning.   Marland Kitchen aspirin EC 325 MG tablet Take 325 mg by mouth at bedtime.  . carvedilol (COREG) 25 MG tablet Take 25 mg by mouth 2 (two) times daily with a meal.  .  cloNIDine (CATAPRES) 0.2 MG tablet Take 0.1-0.2 mg by mouth See admin instructions. Take 0.1 mg by mouth in the morning and 0.2 mg in the evening  . furosemide (LASIX) 40 MG tablet Take 2 tablets (80 mg total) by mouth daily.  Marland Kitchen glimepiride (AMARYL) 4 MG tablet Take 4 mg by mouth 2 (two) times daily.  Marland Kitchen HUMALOG KWIKPEN 100 UNIT/ML KiwkPen Inject 16 Units into the skin daily after breakfast.   . Insulin Glargine (BASAGLAR KWIKPEN) 100 UNIT/ML SOPN Inject 30 Units into the skin at bedtime.   . isosorbide-hydrALAZINE (BIDIL) 20-37.5 MG tablet Take 1 tablet by mouth 2 (two) times daily.  Marland Kitchen KLOR-CON  M10 10 MEQ tablet TAKE 1 TABLET (10 MEQ TOTAL) BY MOUTH 2 (TWO) TIMES DAILY.  . metolazone (ZAROXOLYN) 2.5 MG tablet Take 1 tablet by mouth once weekly if you gain 3-4 pounds in a day  . NON FORMULARY CPAP: At bedtime  . rosuvastatin (CRESTOR) 20 MG tablet Take 20 mg by mouth daily.     Allergies:   Patient has no known allergies.   Social History   Socioeconomic History  . Marital status: Married    Spouse name: Not on file  . Number of children: 1  . Years of education: Not on file  . Highest education level: Not on file  Occupational History  . Occupation: Diplomatic Services operational officer: [employer]  Social Needs  . Financial resource strain: Not on file  . Food insecurity:    Worry: Not on file    Inability: Not on file  . Transportation needs:    Medical: Not on file    Non-medical: Not on file  Tobacco Use  . Smoking status: Former Smoker    Packs/day: 0.50    Years: 27.00    Pack years: 13.50    Types: Cigarettes    Last attempt to quit: 03/25/2010    Years since quitting: 8.3  . Smokeless tobacco: Never Used  Substance and Sexual Activity  . Alcohol use: Yes    Comment: 05/25/2016 "was an occasional drinker; maybe 4 drinks/week; stopped after stroke in 2011"  . Drug use: No  . Sexual activity: Not on file  Lifestyle  . Physical activity:    Days per week: Not on file    Minutes per session: Not on file  . Stress: Not on file  Relationships  . Social connections:    Talks on phone: Not on file    Gets together: Not on file    Attends religious service: Not on file    Active member of club or organization: Not on file    Attends meetings of clubs or organizations: Not on file    Relationship status: Not on file  Other Topics Concern  . Not on file  Social History Narrative  . Not on file     Family History: The patient's family history includes Breast cancer in his mother; Diabetes type II in his father; Heart attack in his father; Hypertension in his  mother and sister; Stroke in his father.  ROS:   Please see the history of present illness.    Constipation, anxiety, joint swelling, leg swelling.  Rash on skin.  All other systems reviewed and are negative.  EKGs/Labs/Other Studies Reviewed:    The following studies were reviewed today: No recent cardiovascular imaging or functional data  EKG:  EKG sinus rhythm with atrial tracking and V pacing.  Recent Labs: 08/11/2017: NT-Pro BNP 740  11/01/2017: B Natriuretic Peptide 195.5 06/13/2018: Platelets 151 07/11/2018: ALT 17; BUN 69; Creatinine, Ser 4.18; Hemoglobin 10.5; Potassium 4.2; Sodium 140  Recent Lipid Panel    Component Value Date/Time   CHOL  02/08/2010 0600    171        ATP III CLASSIFICATION:  <200     mg/dL   Desirable  200-239  mg/dL   Borderline High  >=240    mg/dL   High          TRIG 248 (H) 02/08/2010 0600   HDL 34 (L) 02/08/2010 0600   CHOLHDL 5.0 02/08/2010 0600   VLDL 50 (H) 02/08/2010 0600   LDLCALC  02/08/2010 0600    87        Total Cholesterol/HDL:CHD Risk Coronary Heart Disease Risk Table                     Men   Women  1/2 Average Risk   3.4   3.3  Average Risk       5.0   4.4  2 X Average Risk   9.6   7.1  3 X Average Risk  23.4   11.0        Use the calculated Patient Ratio above and the CHD Risk Table to determine the patient's CHD Risk.        ATP III CLASSIFICATION (LDL):  <100     mg/dL   Optimal  100-129  mg/dL   Near or Above                    Optimal  130-159  mg/dL   Borderline  160-189  mg/dL   High  >190     mg/dL   Very High    Physical Exam:    VS:  BP 118/72   Pulse 74   Ht 5\' 11"  (1.803 m)   Wt (!) 307 lb 3.2 oz (139.3 kg)   SpO2 98%   BMI 42.85 kg/m     Wt Readings from Last 3 Encounters:  07/20/18 (!) 307 lb 3.2 oz (139.3 kg)  02/28/18 300 lb (136.1 kg)  02/21/18 295 lb (133.8 kg)     GEN: Morbid obesity. No acute distress HEENT: Normal NECK: No JVD. LYMPHATICS: No lymphadenopathy CARDIAC: RRR.  Left  lower sternal systolic 2/6 murmur, no gallop, 3+ ankle edema to the mid shin edema VASCULAR: Unable to palpate pedal pulses.  2+ bilateral radial pulses, no bruits RESPIRATORY:  Clear to auscultation without rales, wheezing or rhonchi  ABDOMEN: Soft, non-tender, non-distended, No pulsatile mass, MUSCULOSKELETAL: No deformity  SKIN: Warm and dry NEUROLOGIC:  Alert and oriented x 3 PSYCHIATRIC:  Normal affect   ASSESSMENT:    1. Chronic systolic heart failure ([place])   2. Coronary artery disease involving native coronary artery of native heart without angina pectoris   3. Essential hypertension   4. CKD (chronic kidney disease), stage IV ([place])   5. Type 2 diabetes mellitus with nephropathy ([place])   6. OSA (obstructive sleep apnea)   7. AICD (automatic cardioverter/defibrillator) present    PLAN:    In order of problems listed above:  1. With volume overload.  In my estimation, the patient needs more aggressive loop diuretic therapy.  We are walking a fine line between aggravating kidney dysfunction and debilitating volume overload.  I would recommend 80 mg twice daily of furosemide but will leave this to Dr. Sherlyn Hay did not help. 2. No  symptoms to suggest progression of coronary disease.  Known to have total occlusion of the right coronary by angiography greater than 8 to 10 years ago. 3. Blood pressure control is adequate. 4. CKD stage IV.  Taking transplant classes.  No access site has been implanted yet because of stability of function. 5. Hemoglobin A1c less than 7.  This is the current target. 6. We will repeat a split-night sleep study to ensure that sleep equipment is effective at controlling sleep apnea.  Certainly if he is having an adequate control this would contribute to his edema. 7. No AICD discharge.  Clinical follow-up in 6 months.  Call if angina.  Consider increasing diuretic intensity.   Medication Adjustments/Labs and Tests Ordered: Current medicines are reviewed at  length with the patient today.  Concerns regarding medicines are outlined above.  Orders Placed This Encounter  Procedures  . Split night study   No orders of the defined types were placed in this encounter.   Patient Instructions  Medication Instructions:  Your physician recommends that you continue on your current medications as directed. Please refer to the Current Medication list given to you today.  If you need a refill on your cardiac medications before your next appointment, please call your pharmacy.   Lab work: None Ordered    Testing/Procedures: Your physician has recommended that you have a sleep study. This test records several body functions during sleep, including: brain activity, eye movement, oxygen and carbon dioxide blood levels, heart rate and rhythm, breathing rate and rhythm, the flow of air through your mouth and nose, snoring, body muscle movements, and chest and belly movement.    Follow-Up: At [facility], you and your health needs are our priority.  As part of our continuing mission to provide you with exceptional heart care, we have created designated Provider Care Teams.  These Care Teams include your primary Cardiologist (physician) and Advanced Practice Providers (APPs -  Physician Assistants and Nurse Practitioners) who all work together to provide you with the care you need, when you need it. You will need a follow up appointment in 6 months.  Please call our office 2 months in advance to schedule this appointment.  You may see Sinclair Grooms, MD or one of the following Advanced Practice Providers on your designated Care Team:   Truitt Merle, NP Cecilie Kicks, NP . Kathyrn Drown, NP      Signed, Sinclair Grooms, MD  07/20/2018 3:35 PM    [place]

## 2018-07-21 ENCOUNTER — Telehealth: Payer: Self-pay | Admitting: *Deleted

## 2018-07-21 NOTE — Telephone Encounter (Signed)
-----   Message from Emmaline Life, RN sent at 07/20/2018  4:03 PM EST ----- Please schedule Dr. Thompson Caul patient for a sleep study asap per Dr. Tamala Julian.  Thank you, Sharyn Lull

## 2018-07-21 NOTE — Telephone Encounter (Signed)
Staff message sent to Gae Bon per [facility] web portal no PA required. Decision ID#: [id number].

## 2018-07-27 ENCOUNTER — Telehealth: Payer: Self-pay | Admitting: *Deleted

## 2018-07-27 NOTE — Telephone Encounter (Signed)
-----   Message from Lauralee Evener, [place] sent at 07/21/2018  8:59 AM EST ----- Per [facility] web portal no PA required. Ok to schedule sleep study. ----- Message ----- From: Emmaline Life, RN Sent: 07/20/2018   4:03 PM EST To: [facility] Sleep Studies  Please schedule Dr. Thompson Caul patient for a sleep study asap per Dr. Tamala Julian.  Thank you, Sharyn Lull

## 2018-07-27 NOTE — Telephone Encounter (Signed)
Patient is scheduled for lab study on 09/19/18. Patient understands his sleep study will be done at [facility] sleep lab. Patient understands he will receive a sleep packet in a week or so. Patient understands to call if he does not receive the sleep packet in a timely manner. Patient agrees with treatment and thanked me for call.

## 2018-07-31 ENCOUNTER — Other Ambulatory Visit (HOSPITAL_COMMUNITY): Payer: Self-pay

## 2018-08-01 ENCOUNTER — Ambulatory Visit (HOSPITAL_COMMUNITY)
Admission: RE | Admit: 2018-08-01 | Discharge: 2018-08-01 | Disposition: A | Discharge status: Home / Self Care | Payer: Medicare Other | Source: Ambulatory Visit | Attending: Nephrology | Admitting: Nephrology

## 2018-08-01 DIAGNOSIS — D631 Anemia in chronic kidney disease: Secondary | ICD-10-CM | POA: Diagnosis not present

## 2018-08-01 DIAGNOSIS — N189 Chronic kidney disease, unspecified: Secondary | ICD-10-CM | POA: Diagnosis not present

## 2018-08-01 MED ORDER — SODIUM CHLORIDE 0.9 % IV SOLN
510.0000 mg | INTRAVENOUS | Status: DC
  Administered 2018-08-01: 510 mg via INTRAVENOUS
  Filled 2018-08-01: qty 510

## 2018-08-08 ENCOUNTER — Ambulatory Visit (HOSPITAL_COMMUNITY)
Admission: RE | Admit: 2018-08-08 | Discharge: 2018-08-08 | Disposition: A | Discharge status: Home / Self Care | Payer: Medicare Other | Source: Ambulatory Visit | Attending: Nephrology | Admitting: Nephrology

## 2018-08-08 VITALS — BP 131/66 | HR 65 | Temp 98.6°F | Resp 20 | Ht 72.0 in | Wt 300.0 lb

## 2018-08-08 DIAGNOSIS — N184 Chronic kidney disease, stage 4 (severe): Secondary | ICD-10-CM | POA: Diagnosis not present

## 2018-08-08 LAB — CBC WITH DIFFERENTIAL/PLATELET
Abs Immature Granulocytes: 0.03 10*3/uL (ref 0.00–0.07)
Basophils Absolute: 0 10*3/uL (ref 0.0–0.1)
Basophils Relative: 0 %
Eosinophils Absolute: 0.2 10*3/uL (ref 0.0–0.5)
Eosinophils Relative: 3 %
HCT: 33.6 % — ABNORMAL LOW (ref 39.0–52.0)
Hemoglobin: 9.9 g/dL — ABNORMAL LOW (ref 13.0–17.0)
Immature Granulocytes: 0 %
Lymphocytes Relative: 31 %
Lymphs Abs: 2.1 10*3/uL (ref 0.7–4.0)
MCH: 26.3 pg (ref 26.0–34.0)
MCHC: 29.5 g/dL — ABNORMAL LOW (ref 30.0–36.0)
MCV: 89.4 fL (ref 80.0–100.0)
Monocytes Absolute: 0.6 10*3/uL (ref 0.1–1.0)
Monocytes Relative: 9 %
NEUTROS PCT: 57 %
NRBC: 0 % (ref 0.0–0.2)
Neutro Abs: 4 10*3/uL (ref 1.7–7.7)
PLATELETS: 148 10*3/uL — AB (ref 150–400)
RBC: 3.76 MIL/uL — ABNORMAL LOW (ref 4.22–5.81)
RDW: 17 % — ABNORMAL HIGH (ref 11.5–15.5)
WBC: 7 10*3/uL (ref 4.0–10.5)

## 2018-08-08 LAB — POCT HEMOGLOBIN-HEMACUE: Hemoglobin: 10.5 g/dL — ABNORMAL LOW (ref 13.0–17.0)

## 2018-08-08 MED ORDER — DARBEPOETIN ALFA 40 MCG/0.4ML IJ SOSY
PREFILLED_SYRINGE | INTRAMUSCULAR | Status: AC
  Administered 2018-08-08: 40 ug via SUBCUTANEOUS
  Filled 2018-08-08: qty 0.4

## 2018-08-08 MED ORDER — DARBEPOETIN ALFA 40 MCG/0.4ML IJ SOSY
40.0000 ug | PREFILLED_SYRINGE | INTRAMUSCULAR | Status: DC
  Administered 2018-08-08: 40 ug via SUBCUTANEOUS

## 2018-08-08 MED ORDER — SODIUM CHLORIDE 0.9 % IV SOLN
510.0000 mg | INTRAVENOUS | Status: DC
  Administered 2018-08-08: 510 mg via INTRAVENOUS
  Filled 2018-08-08: qty 510

## 2018-08-29 ENCOUNTER — Other Ambulatory Visit: Payer: Self-pay | Admitting: Internal Medicine

## 2018-08-29 DIAGNOSIS — E042 Nontoxic multinodular goiter: Secondary | ICD-10-CM

## 2018-09-05 ENCOUNTER — Other Ambulatory Visit: Payer: Self-pay

## 2018-09-05 ENCOUNTER — Ambulatory Visit (INDEPENDENT_AMBULATORY_CARE_PROVIDER_SITE_OTHER): Payer: Medicare Other | Admitting: *Deleted

## 2018-09-05 ENCOUNTER — Encounter (HOSPITAL_COMMUNITY)
Admission: RE | Admit: 2018-09-05 | Discharge: 2018-09-05 | Disposition: A | Discharge status: Home / Self Care | Payer: Medicare Other | Source: Ambulatory Visit | Attending: Nephrology | Admitting: Nephrology

## 2018-09-05 VITALS — BP 130/70 | HR 81 | Temp 97.6°F | Resp 18

## 2018-09-05 DIAGNOSIS — I5022 Chronic systolic (congestive) heart failure: Secondary | ICD-10-CM | POA: Diagnosis not present

## 2018-09-05 DIAGNOSIS — I255 Ischemic cardiomyopathy: Secondary | ICD-10-CM

## 2018-09-05 DIAGNOSIS — N184 Chronic kidney disease, stage 4 (severe): Secondary | ICD-10-CM | POA: Diagnosis not present

## 2018-09-05 LAB — CUP PACEART REMOTE DEVICE CHECK
Battery Voltage: 2.98 V
Brady Statistic AP VP Percent: 74.97 %
Brady Statistic AP VS Percent: 0.39 %
Brady Statistic AS VP Percent: 23.85 %
Brady Statistic AS VS Percent: 0.8 %
Brady Statistic RA Percent Paced: 74.22 %
Brady Statistic RV Percent Paced: 95.95 %
Date Time Interrogation Session: 20200324083723
HighPow Impedance: 55 Ohm
Implantable Lead Implant Date: 20171212
Implantable Lead Implant Date: 20171212
Implantable Lead Implant Date: 20171212
Implantable Lead Location: 753858
Implantable Lead Location: 753859
Implantable Lead Location: 753860
Implantable Lead Model: 4298
Implantable Lead Model: 5076
Implantable Pulse Generator Implant Date: 20171212
Lead Channel Impedance Value: 123.5 Ohm
Lead Channel Impedance Value: 123.5 Ohm
Lead Channel Impedance Value: 132.321
Lead Channel Impedance Value: 132.321
Lead Channel Impedance Value: 132.321
Lead Channel Impedance Value: 247 Ohm
Lead Channel Impedance Value: 247 Ohm
Lead Channel Impedance Value: 247 Ohm
Lead Channel Impedance Value: 285 Ohm
Lead Channel Impedance Value: 342 Ohm
Lead Channel Impedance Value: 342 Ohm
Lead Channel Impedance Value: 399 Ohm
Lead Channel Impedance Value: 418 Ohm
Lead Channel Impedance Value: 418 Ohm
Lead Channel Impedance Value: 418 Ohm
Lead Channel Impedance Value: 418 Ohm
Lead Channel Impedance Value: 456 Ohm
Lead Channel Pacing Threshold Amplitude: 0.625 V
Lead Channel Pacing Threshold Amplitude: 0.625 V
Lead Channel Pacing Threshold Amplitude: 0.875 V
Lead Channel Pacing Threshold Pulse Width: 0.4 ms
Lead Channel Pacing Threshold Pulse Width: 0.4 ms
Lead Channel Pacing Threshold Pulse Width: 0.4 ms
Lead Channel Sensing Intrinsic Amplitude: 17.25 mV
Lead Channel Sensing Intrinsic Amplitude: 17.25 mV
Lead Channel Sensing Intrinsic Amplitude: 4.125 mV
Lead Channel Sensing Intrinsic Amplitude: 4.125 mV
Lead Channel Setting Pacing Amplitude: 1.25 V
Lead Channel Setting Pacing Amplitude: 2 V
Lead Channel Setting Pacing Amplitude: 2.5 V
Lead Channel Setting Pacing Pulse Width: 0.4 ms
Lead Channel Setting Pacing Pulse Width: 0.4 ms
Lead Channel Setting Sensing Sensitivity: 0.3 mV
MDC IDC MSMT BATTERY REMAINING LONGEVITY: 62 mo
MDC IDC MSMT LEADCHNL LV IMPEDANCE VALUE: 247 Ohm

## 2018-09-05 LAB — CBC WITH DIFFERENTIAL/PLATELET
ABS IMMATURE GRANULOCYTES: 0.01 10*3/uL (ref 0.00–0.07)
Basophils Absolute: 0 10*3/uL (ref 0.0–0.1)
Basophils Relative: 0 %
Eosinophils Absolute: 0.2 10*3/uL (ref 0.0–0.5)
Eosinophils Relative: 3 %
HCT: 34.8 % — ABNORMAL LOW (ref 39.0–52.0)
HEMOGLOBIN: 10.7 g/dL — AB (ref 13.0–17.0)
IMMATURE GRANULOCYTES: 0 %
Lymphocytes Relative: 33 %
Lymphs Abs: 2.1 10*3/uL (ref 0.7–4.0)
MCH: 26.7 pg (ref 26.0–34.0)
MCHC: 30.7 g/dL (ref 30.0–36.0)
MCV: 86.8 fL (ref 80.0–100.0)
Monocytes Absolute: 0.6 10*3/uL (ref 0.1–1.0)
Monocytes Relative: 9 %
Neutro Abs: 3.5 10*3/uL (ref 1.7–7.7)
Neutrophils Relative %: 55 %
Platelets: 156 10*3/uL (ref 150–400)
RBC: 4.01 MIL/uL — ABNORMAL LOW (ref 4.22–5.81)
RDW: 16.5 % — ABNORMAL HIGH (ref 11.5–15.5)
WBC: 6.5 10*3/uL (ref 4.0–10.5)
nRBC: 0 % (ref 0.0–0.2)

## 2018-09-05 LAB — IRON AND TIBC
Iron: 64 ug/dL (ref 45–182)
Saturation Ratios: 26 % (ref 17.9–39.5)
TIBC: 248 ug/dL — ABNORMAL LOW (ref 250–450)
UIBC: 184 ug/dL

## 2018-09-05 LAB — PHOSPHORUS: Phosphorus: 4.5 mg/dL (ref 2.5–4.6)

## 2018-09-05 LAB — COMPREHENSIVE METABOLIC PANEL
ALT: 13 U/L (ref 0–44)
AST: 15 U/L (ref 15–41)
Albumin: 4 g/dL (ref 3.5–5.0)
Alkaline Phosphatase: 78 U/L (ref 38–126)
Anion gap: 6 (ref 5–15)
BUN: 102 mg/dL — ABNORMAL HIGH (ref 8–23)
CO2: 18 mmol/L — ABNORMAL LOW (ref 22–32)
Calcium: 9.6 mg/dL (ref 8.9–10.3)
Chloride: 110 mmol/L (ref 98–111)
Creatinine, Ser: 4.37 mg/dL — ABNORMAL HIGH (ref 0.61–1.24)
GFR calc Af Amer: 15 mL/min — ABNORMAL LOW (ref >60)
GFR calc non Af Amer: 13 mL/min — ABNORMAL LOW (ref >60)
Glucose, Bld: 293 mg/dL — ABNORMAL HIGH (ref 70–99)
Potassium: 3.9 mmol/L (ref 3.5–5.1)
Sodium: 134 mmol/L — ABNORMAL LOW (ref 135–145)
TOTAL PROTEIN: 7.1 g/dL (ref 6.5–8.1)
Total Bilirubin: 0.7 mg/dL (ref 0.3–1.2)

## 2018-09-05 LAB — FERRITIN: Ferritin: 804 ng/mL — ABNORMAL HIGH (ref 24–336)

## 2018-09-05 LAB — POCT HEMOGLOBIN-HEMACUE: Hemoglobin: 10.9 g/dL — ABNORMAL LOW (ref 13.0–17.0)

## 2018-09-05 MED ORDER — DARBEPOETIN ALFA 40 MCG/0.4ML IJ SOSY
40.0000 ug | PREFILLED_SYRINGE | INTRAMUSCULAR | Status: DC
  Administered 2018-09-05: 40 ug via SUBCUTANEOUS

## 2018-09-05 MED ORDER — DARBEPOETIN ALFA 40 MCG/0.4ML IJ SOSY
PREFILLED_SYRINGE | INTRAMUSCULAR | Status: AC
  Administered 2018-09-05: 40 ug via SUBCUTANEOUS
  Filled 2018-09-05: qty 0.4

## 2018-09-11 ENCOUNTER — Encounter: Payer: Self-pay | Admitting: Cardiology

## 2018-09-11 NOTE — Progress Notes (Signed)
Remote ICD transmission.   

## 2018-09-19 ENCOUNTER — Encounter (HOSPITAL_BASED_OUTPATIENT_CLINIC_OR_DEPARTMENT_OTHER): Payer: Medicare Other

## 2018-10-02 ENCOUNTER — Other Ambulatory Visit: Payer: Medicare Other

## 2018-10-03 ENCOUNTER — Other Ambulatory Visit: Payer: Self-pay

## 2018-10-03 ENCOUNTER — Ambulatory Visit (HOSPITAL_COMMUNITY)
Admission: RE | Admit: 2018-10-03 | Discharge: 2018-10-03 | Disposition: A | Discharge status: Home / Self Care | Payer: Medicare Other | Source: Ambulatory Visit | Attending: Nephrology | Admitting: Nephrology

## 2018-10-03 VITALS — BP 113/62 | HR 68 | Temp 98.0°F | Resp 20

## 2018-10-03 DIAGNOSIS — N184 Chronic kidney disease, stage 4 (severe): Secondary | ICD-10-CM | POA: Diagnosis not present

## 2018-10-03 LAB — CBC WITH DIFFERENTIAL/PLATELET
Abs Immature Granulocytes: 0.02 10*3/uL (ref 0.00–0.07)
Basophils Absolute: 0 10*3/uL (ref 0.0–0.1)
Basophils Relative: 0 %
Eosinophils Absolute: 0.2 10*3/uL (ref 0.0–0.5)
Eosinophils Relative: 3 %
HCT: 34.4 % — ABNORMAL LOW (ref 39.0–52.0)
Hemoglobin: 10.7 g/dL — ABNORMAL LOW (ref 13.0–17.0)
Immature Granulocytes: 0 %
Lymphocytes Relative: 37 %
Lymphs Abs: 2.5 10*3/uL (ref 0.7–4.0)
MCH: 27.2 pg (ref 26.0–34.0)
MCHC: 31.1 g/dL (ref 30.0–36.0)
MCV: 87.3 fL (ref 80.0–100.0)
Monocytes Absolute: 0.7 10*3/uL (ref 0.1–1.0)
Monocytes Relative: 10 %
Neutro Abs: 3.4 10*3/uL (ref 1.7–7.7)
Neutrophils Relative %: 50 %
Platelets: 150 10*3/uL (ref 150–400)
RBC: 3.94 MIL/uL — ABNORMAL LOW (ref 4.22–5.81)
RDW: 16 % — ABNORMAL HIGH (ref 11.5–15.5)
WBC: 6.8 10*3/uL (ref 4.0–10.5)
nRBC: 0 % (ref 0.0–0.2)

## 2018-10-03 LAB — POCT HEMOGLOBIN-HEMACUE: Hemoglobin: 10.9 g/dL — ABNORMAL LOW (ref 13.0–17.0)

## 2018-10-03 MED ORDER — DARBEPOETIN ALFA 40 MCG/0.4ML IJ SOSY
PREFILLED_SYRINGE | INTRAMUSCULAR | Status: AC
  Filled 2018-10-03: qty 0.4

## 2018-10-03 MED ORDER — DARBEPOETIN ALFA 40 MCG/0.4ML IJ SOSY
40.0000 ug | PREFILLED_SYRINGE | INTRAMUSCULAR | Status: DC
  Administered 2018-10-03: 40 ug via SUBCUTANEOUS

## 2018-10-31 ENCOUNTER — Ambulatory Visit (HOSPITAL_COMMUNITY)
Admission: RE | Admit: 2018-10-31 | Discharge: 2018-10-31 | Disposition: A | Discharge status: Home / Self Care | Payer: Medicare Other | Source: Ambulatory Visit | Attending: Nephrology | Admitting: Nephrology

## 2018-10-31 ENCOUNTER — Other Ambulatory Visit: Payer: Self-pay

## 2018-10-31 VITALS — BP 130/61 | HR 66 | Temp 97.7°F | Resp 20

## 2018-10-31 DIAGNOSIS — N184 Chronic kidney disease, stage 4 (severe): Secondary | ICD-10-CM | POA: Diagnosis not present

## 2018-10-31 LAB — CBC WITH DIFFERENTIAL/PLATELET
Abs Immature Granulocytes: 0.01 10*3/uL (ref 0.00–0.07)
Basophils Absolute: 0 10*3/uL (ref 0.0–0.1)
Basophils Relative: 0 %
Eosinophils Absolute: 0.2 10*3/uL (ref 0.0–0.5)
Eosinophils Relative: 3 %
HCT: 36.6 % — ABNORMAL LOW (ref 39.0–52.0)
Hemoglobin: 11.4 g/dL — ABNORMAL LOW (ref 13.0–17.0)
Immature Granulocytes: 0 %
Lymphocytes Relative: 33 %
Lymphs Abs: 2 10*3/uL (ref 0.7–4.0)
MCH: 27.3 pg (ref 26.0–34.0)
MCHC: 31.1 g/dL (ref 30.0–36.0)
MCV: 87.8 fL (ref 80.0–100.0)
Monocytes Absolute: 0.5 10*3/uL (ref 0.1–1.0)
Monocytes Relative: 8 %
Neutro Abs: 3.3 10*3/uL (ref 1.7–7.7)
Neutrophils Relative %: 56 %
Platelets: 147 10*3/uL — ABNORMAL LOW (ref 150–400)
RBC: 4.17 MIL/uL — ABNORMAL LOW (ref 4.22–5.81)
RDW: 16.1 % — ABNORMAL HIGH (ref 11.5–15.5)
WBC: 5.9 10*3/uL (ref 4.0–10.5)
nRBC: 0 % (ref 0.0–0.2)

## 2018-10-31 LAB — COMPREHENSIVE METABOLIC PANEL
ALT: 24 U/L (ref 0–44)
AST: 22 U/L (ref 15–41)
Albumin: 4.1 g/dL (ref 3.5–5.0)
Alkaline Phosphatase: 81 U/L (ref 38–126)
Anion gap: 10 (ref 5–15)
BUN: 73 mg/dL — ABNORMAL HIGH (ref 8–23)
CO2: 21 mmol/L — ABNORMAL LOW (ref 22–32)
Calcium: 10.2 mg/dL (ref 8.9–10.3)
Chloride: 107 mmol/L (ref 98–111)
Creatinine, Ser: 4.01 mg/dL — ABNORMAL HIGH (ref 0.61–1.24)
GFR calc Af Amer: 17 mL/min — ABNORMAL LOW (ref >60)
GFR calc non Af Amer: 15 mL/min — ABNORMAL LOW (ref >60)
Glucose, Bld: 172 mg/dL — ABNORMAL HIGH (ref 70–99)
Potassium: 3.7 mmol/L (ref 3.5–5.1)
Sodium: 138 mmol/L (ref 135–145)
Total Bilirubin: 0.3 mg/dL (ref 0.3–1.2)
Total Protein: 7.4 g/dL (ref 6.5–8.1)

## 2018-10-31 LAB — IRON AND TIBC
Iron: 60 ug/dL (ref 45–182)
Saturation Ratios: 24 % (ref 17.9–39.5)
TIBC: 249 ug/dL — ABNORMAL LOW (ref 250–450)
UIBC: 189 ug/dL

## 2018-10-31 LAB — POCT HEMOGLOBIN-HEMACUE: Hemoglobin: 11.5 g/dL — ABNORMAL LOW (ref 13.0–17.0)

## 2018-10-31 LAB — FERRITIN: Ferritin: 712 ng/mL — ABNORMAL HIGH (ref 24–336)

## 2018-10-31 LAB — PHOSPHORUS: Phosphorus: 3.9 mg/dL (ref 2.5–4.6)

## 2018-10-31 MED ORDER — DARBEPOETIN ALFA 40 MCG/0.4ML IJ SOSY
PREFILLED_SYRINGE | INTRAMUSCULAR | Status: AC
  Administered 2018-10-31: 40 ug via SUBCUTANEOUS
  Filled 2018-10-31: qty 0.4

## 2018-10-31 MED ORDER — DARBEPOETIN ALFA 40 MCG/0.4ML IJ SOSY
40.0000 ug | PREFILLED_SYRINGE | INTRAMUSCULAR | Status: DC
  Administered 2018-10-31: 13:00:00 40 ug via SUBCUTANEOUS

## 2018-11-02 ENCOUNTER — Other Ambulatory Visit: Payer: Medicare Other

## 2018-11-16 ENCOUNTER — Other Ambulatory Visit: Payer: Medicare Other

## 2018-11-17 ENCOUNTER — Other Ambulatory Visit: Payer: Self-pay

## 2018-11-17 ENCOUNTER — Ambulatory Visit
Admission: RE | Admit: 2018-11-17 | Discharge: 2018-11-17 | Disposition: A | Discharge status: Home / Self Care | Payer: Medicare Other | Source: Ambulatory Visit | Attending: Internal Medicine | Admitting: Internal Medicine

## 2018-11-17 DIAGNOSIS — E042 Nontoxic multinodular goiter: Secondary | ICD-10-CM

## 2018-11-28 ENCOUNTER — Other Ambulatory Visit: Payer: Self-pay

## 2018-11-28 ENCOUNTER — Ambulatory Visit (HOSPITAL_COMMUNITY)
Admission: RE | Admit: 2018-11-28 | Discharge: 2018-11-28 | Disposition: A | Discharge status: Home / Self Care | Payer: Medicare Other | Source: Ambulatory Visit | Attending: Nephrology | Admitting: Nephrology

## 2018-11-28 VITALS — BP 111/63 | HR 73 | Temp 97.2°F | Resp 18

## 2018-11-28 DIAGNOSIS — N184 Chronic kidney disease, stage 4 (severe): Secondary | ICD-10-CM | POA: Diagnosis not present

## 2018-11-28 LAB — CBC WITH DIFFERENTIAL/PLATELET
Abs Immature Granulocytes: 0.02 10*3/uL (ref 0.00–0.07)
Basophils Absolute: 0 10*3/uL (ref 0.0–0.1)
Basophils Relative: 0 %
Eosinophils Absolute: 0.2 10*3/uL (ref 0.0–0.5)
Eosinophils Relative: 3 %
HCT: 35.1 % — ABNORMAL LOW (ref 39.0–52.0)
Hemoglobin: 11 g/dL — ABNORMAL LOW (ref 13.0–17.0)
Immature Granulocytes: 0 %
Lymphocytes Relative: 38 %
Lymphs Abs: 2.6 10*3/uL (ref 0.7–4.0)
MCH: 27.2 pg (ref 26.0–34.0)
MCHC: 31.3 g/dL (ref 30.0–36.0)
MCV: 86.9 fL (ref 80.0–100.0)
Monocytes Absolute: 0.6 10*3/uL (ref 0.1–1.0)
Monocytes Relative: 9 %
Neutro Abs: 3.3 10*3/uL (ref 1.7–7.7)
Neutrophils Relative %: 50 %
Platelets: 143 10*3/uL — ABNORMAL LOW (ref 150–400)
RBC: 4.04 MIL/uL — ABNORMAL LOW (ref 4.22–5.81)
RDW: 16 % — ABNORMAL HIGH (ref 11.5–15.5)
WBC: 6.7 10*3/uL (ref 4.0–10.5)
nRBC: 0 % (ref 0.0–0.2)

## 2018-11-28 LAB — POCT HEMOGLOBIN-HEMACUE: Hemoglobin: 11.1 g/dL — ABNORMAL LOW (ref 13.0–17.0)

## 2018-11-28 MED ORDER — DARBEPOETIN ALFA 40 MCG/0.4ML IJ SOSY
40.0000 ug | PREFILLED_SYRINGE | INTRAMUSCULAR | Status: DC
  Administered 2018-11-28: 40 ug via SUBCUTANEOUS

## 2018-11-28 MED ORDER — DARBEPOETIN ALFA 40 MCG/0.4ML IJ SOSY
PREFILLED_SYRINGE | INTRAMUSCULAR | Status: AC
  Filled 2018-11-28: qty 0.4

## 2018-12-05 ENCOUNTER — Ambulatory Visit (INDEPENDENT_AMBULATORY_CARE_PROVIDER_SITE_OTHER): Payer: Medicare Other | Admitting: *Deleted

## 2018-12-05 DIAGNOSIS — I5022 Chronic systolic (congestive) heart failure: Secondary | ICD-10-CM

## 2018-12-05 DIAGNOSIS — I429 Cardiomyopathy, unspecified: Secondary | ICD-10-CM

## 2018-12-05 LAB — CUP PACEART REMOTE DEVICE CHECK
Battery Remaining Longevity: 57 mo
Battery Voltage: 2.97 V
Brady Statistic AP VP Percent: 73.03 %
Brady Statistic AP VS Percent: 0.28 %
Brady Statistic AS VP Percent: 25.73 %
Brady Statistic AS VS Percent: 0.96 %
Brady Statistic RA Percent Paced: 71.85 %
Brady Statistic RV Percent Paced: 94.93 %
Date Time Interrogation Session: 20200623073423
HighPow Impedance: 56 Ohm
Implantable Lead Implant Date: 20171212
Implantable Lead Implant Date: 20171212
Implantable Lead Implant Date: 20171212
Implantable Lead Location: 753858
Implantable Lead Location: 753859
Implantable Lead Location: 753860
Implantable Lead Model: 4298
Implantable Lead Model: 5076
Implantable Pulse Generator Implant Date: 20171212
Lead Channel Impedance Value: 118.56 Ohm
Lead Channel Impedance Value: 126.667
Lead Channel Impedance Value: 126.667
Lead Channel Impedance Value: 132.321
Lead Channel Impedance Value: 142.5 Ohm
Lead Channel Impedance Value: 228 Ohm
Lead Channel Impedance Value: 247 Ohm
Lead Channel Impedance Value: 247 Ohm
Lead Channel Impedance Value: 285 Ohm
Lead Channel Impedance Value: 285 Ohm
Lead Channel Impedance Value: 304 Ohm
Lead Channel Impedance Value: 342 Ohm
Lead Channel Impedance Value: 361 Ohm
Lead Channel Impedance Value: 399 Ohm
Lead Channel Impedance Value: 418 Ohm
Lead Channel Impedance Value: 418 Ohm
Lead Channel Impedance Value: 418 Ohm
Lead Channel Impedance Value: 456 Ohm
Lead Channel Pacing Threshold Amplitude: 0.625 V
Lead Channel Pacing Threshold Amplitude: 0.625 V
Lead Channel Pacing Threshold Amplitude: 0.75 V
Lead Channel Pacing Threshold Pulse Width: 0.4 ms
Lead Channel Pacing Threshold Pulse Width: 0.4 ms
Lead Channel Pacing Threshold Pulse Width: 0.4 ms
Lead Channel Sensing Intrinsic Amplitude: 18.25 mV
Lead Channel Sensing Intrinsic Amplitude: 18.25 mV
Lead Channel Sensing Intrinsic Amplitude: 4.125 mV
Lead Channel Sensing Intrinsic Amplitude: 4.125 mV
Lead Channel Setting Pacing Amplitude: 1.25 V
Lead Channel Setting Pacing Amplitude: 2 V
Lead Channel Setting Pacing Amplitude: 2.5 V
Lead Channel Setting Pacing Pulse Width: 0.4 ms
Lead Channel Setting Pacing Pulse Width: 0.4 ms
Lead Channel Setting Sensing Sensitivity: 0.3 mV

## 2018-12-18 NOTE — Progress Notes (Signed)
Remote ICD transmission.   

## 2018-12-26 ENCOUNTER — Ambulatory Visit (HOSPITAL_COMMUNITY)
Admission: RE | Admit: 2018-12-26 | Discharge: 2018-12-26 | Disposition: A | Discharge status: Home / Self Care | Payer: Medicare Other | Source: Ambulatory Visit | Attending: Nephrology | Admitting: Nephrology

## 2018-12-26 ENCOUNTER — Other Ambulatory Visit: Payer: Self-pay

## 2018-12-26 VITALS — BP 117/61 | HR 66 | Temp 97.5°F | Resp 20

## 2018-12-26 DIAGNOSIS — N184 Chronic kidney disease, stage 4 (severe): Secondary | ICD-10-CM | POA: Insufficient documentation

## 2018-12-26 LAB — COMPREHENSIVE METABOLIC PANEL
ALT: 19 U/L (ref 0–44)
AST: 16 U/L (ref 15–41)
Albumin: 4.2 g/dL (ref 3.5–5.0)
Alkaline Phosphatase: 80 U/L (ref 38–126)
Anion gap: 13 (ref 5–15)
BUN: 97 mg/dL — ABNORMAL HIGH (ref 8–23)
CO2: 18 mmol/L — ABNORMAL LOW (ref 22–32)
Calcium: 10.2 mg/dL (ref 8.9–10.3)
Chloride: 109 mmol/L (ref 98–111)
Creatinine, Ser: 4.8 mg/dL — ABNORMAL HIGH (ref 0.61–1.24)
GFR calc Af Amer: 14 mL/min — ABNORMAL LOW (ref >60)
GFR calc non Af Amer: 12 mL/min — ABNORMAL LOW (ref >60)
Glucose, Bld: 70 mg/dL (ref 70–99)
Potassium: 3.7 mmol/L (ref 3.5–5.1)
Sodium: 140 mmol/L (ref 135–145)
Total Bilirubin: 0.4 mg/dL (ref 0.3–1.2)
Total Protein: 7.6 g/dL (ref 6.5–8.1)

## 2018-12-26 LAB — CBC WITH DIFFERENTIAL/PLATELET
Abs Immature Granulocytes: 0.03 10*3/uL (ref 0.00–0.07)
Basophils Absolute: 0 10*3/uL (ref 0.0–0.1)
Basophils Relative: 0 %
Eosinophils Absolute: 0.2 10*3/uL (ref 0.0–0.5)
Eosinophils Relative: 2 %
HCT: 35 % — ABNORMAL LOW (ref 39.0–52.0)
Hemoglobin: 11 g/dL — ABNORMAL LOW (ref 13.0–17.0)
Immature Granulocytes: 0 %
Lymphocytes Relative: 36 %
Lymphs Abs: 2.4 10*3/uL (ref 0.7–4.0)
MCH: 27.4 pg (ref 26.0–34.0)
MCHC: 31.4 g/dL (ref 30.0–36.0)
MCV: 87.3 fL (ref 80.0–100.0)
Monocytes Absolute: 0.8 10*3/uL (ref 0.1–1.0)
Monocytes Relative: 11 %
Neutro Abs: 3.4 10*3/uL (ref 1.7–7.7)
Neutrophils Relative %: 51 %
Platelets: 142 10*3/uL — ABNORMAL LOW (ref 150–400)
RBC: 4.01 MIL/uL — ABNORMAL LOW (ref 4.22–5.81)
RDW: 16.1 % — ABNORMAL HIGH (ref 11.5–15.5)
WBC: 6.8 10*3/uL (ref 4.0–10.5)
nRBC: 0 % (ref 0.0–0.2)

## 2018-12-26 LAB — IRON AND TIBC
Iron: 55 ug/dL (ref 45–182)
Saturation Ratios: 22 % (ref 17.9–39.5)
TIBC: 249 ug/dL — ABNORMAL LOW (ref 250–450)
UIBC: 194 ug/dL

## 2018-12-26 LAB — POCT HEMOGLOBIN-HEMACUE: Hemoglobin: 11 g/dL — ABNORMAL LOW (ref 13.0–17.0)

## 2018-12-26 LAB — FERRITIN: Ferritin: 734 ng/mL — ABNORMAL HIGH (ref 24–336)

## 2018-12-26 LAB — PHOSPHORUS: Phosphorus: 4.6 mg/dL (ref 2.5–4.6)

## 2018-12-26 MED ORDER — DARBEPOETIN ALFA 40 MCG/0.4ML IJ SOSY
PREFILLED_SYRINGE | INTRAMUSCULAR | Status: AC
  Filled 2018-12-26: qty 0.4

## 2018-12-26 MED ORDER — DARBEPOETIN ALFA 40 MCG/0.4ML IJ SOSY
40.0000 ug | PREFILLED_SYRINGE | INTRAMUSCULAR | Status: DC
  Administered 2018-12-26: 40 ug via SUBCUTANEOUS

## 2019-01-23 ENCOUNTER — Ambulatory Visit (HOSPITAL_COMMUNITY)
Admission: RE | Admit: 2019-01-23 | Discharge: 2019-01-23 | Disposition: A | Discharge status: Home / Self Care | Payer: Medicare Other | Source: Ambulatory Visit | Attending: Nephrology | Admitting: Nephrology

## 2019-01-23 ENCOUNTER — Other Ambulatory Visit: Payer: Self-pay

## 2019-01-23 VITALS — BP 129/72 | HR 90 | Temp 97.2°F | Resp 18

## 2019-01-23 DIAGNOSIS — N184 Chronic kidney disease, stage 4 (severe): Secondary | ICD-10-CM | POA: Diagnosis not present

## 2019-01-23 LAB — CBC WITH DIFFERENTIAL/PLATELET
Abs Immature Granulocytes: 0.02 10*3/uL (ref 0.00–0.07)
Basophils Absolute: 0 10*3/uL (ref 0.0–0.1)
Basophils Relative: 0 %
Eosinophils Absolute: 0.2 10*3/uL (ref 0.0–0.5)
Eosinophils Relative: 3 %
HCT: 34.9 % — ABNORMAL LOW (ref 39.0–52.0)
Hemoglobin: 11 g/dL — ABNORMAL LOW (ref 13.0–17.0)
Immature Granulocytes: 0 %
Lymphocytes Relative: 36 %
Lymphs Abs: 2.6 10*3/uL (ref 0.7–4.0)
MCH: 27.6 pg (ref 26.0–34.0)
MCHC: 31.5 g/dL (ref 30.0–36.0)
MCV: 87.5 fL (ref 80.0–100.0)
Monocytes Absolute: 0.8 10*3/uL (ref 0.1–1.0)
Monocytes Relative: 11 %
Neutro Abs: 3.6 10*3/uL (ref 1.7–7.7)
Neutrophils Relative %: 50 %
Platelets: 172 10*3/uL (ref 150–400)
RBC: 3.99 MIL/uL — ABNORMAL LOW (ref 4.22–5.81)
RDW: 16.3 % — ABNORMAL HIGH (ref 11.5–15.5)
WBC: 7.2 10*3/uL (ref 4.0–10.5)
nRBC: 0 % (ref 0.0–0.2)

## 2019-01-23 LAB — POCT HEMOGLOBIN-HEMACUE: Hemoglobin: 10.9 g/dL — ABNORMAL LOW (ref 13.0–17.0)

## 2019-01-23 MED ORDER — DARBEPOETIN ALFA 40 MCG/0.4ML IJ SOSY
40.0000 ug | PREFILLED_SYRINGE | INTRAMUSCULAR | Status: DC
  Administered 2019-01-23: 40 ug via SUBCUTANEOUS

## 2019-01-23 MED ORDER — DARBEPOETIN ALFA 40 MCG/0.4ML IJ SOSY
PREFILLED_SYRINGE | INTRAMUSCULAR | Status: AC
  Filled 2019-01-23: qty 0.4

## 2019-02-12 ENCOUNTER — Other Ambulatory Visit: Payer: Self-pay | Admitting: Interventional Cardiology

## 2019-02-20 ENCOUNTER — Other Ambulatory Visit: Payer: Self-pay

## 2019-02-20 ENCOUNTER — Ambulatory Visit (HOSPITAL_COMMUNITY)
Admission: RE | Admit: 2019-02-20 | Discharge: 2019-02-20 | Disposition: A | Discharge status: Home / Self Care | Payer: Medicare Other | Source: Ambulatory Visit | Attending: Nephrology | Admitting: Nephrology

## 2019-02-20 VITALS — BP 114/62 | HR 73 | Temp 97.4°F | Resp 18

## 2019-02-20 DIAGNOSIS — N184 Chronic kidney disease, stage 4 (severe): Secondary | ICD-10-CM | POA: Diagnosis not present

## 2019-02-20 LAB — COMPREHENSIVE METABOLIC PANEL
ALT: 17 U/L (ref 0–44)
AST: 17 U/L (ref 15–41)
Albumin: 3.8 g/dL (ref 3.5–5.0)
Alkaline Phosphatase: 87 U/L (ref 38–126)
Anion gap: 13 (ref 5–15)
BUN: 94 mg/dL — ABNORMAL HIGH (ref 8–23)
CO2: 20 mmol/L — ABNORMAL LOW (ref 22–32)
Calcium: 9.6 mg/dL (ref 8.9–10.3)
Chloride: 103 mmol/L (ref 98–111)
Creatinine, Ser: 5.1 mg/dL — ABNORMAL HIGH (ref 0.61–1.24)
GFR calc Af Amer: 13 mL/min — ABNORMAL LOW (ref >60)
GFR calc non Af Amer: 11 mL/min — ABNORMAL LOW (ref >60)
Glucose, Bld: 298 mg/dL — ABNORMAL HIGH (ref 70–99)
Potassium: 3.7 mmol/L (ref 3.5–5.1)
Sodium: 136 mmol/L (ref 135–145)
Total Bilirubin: 0.6 mg/dL (ref 0.3–1.2)
Total Protein: 7.2 g/dL (ref 6.5–8.1)

## 2019-02-20 LAB — CBC WITH DIFFERENTIAL/PLATELET
Abs Immature Granulocytes: 0.01 10*3/uL (ref 0.00–0.07)
Basophils Absolute: 0 10*3/uL (ref 0.0–0.1)
Basophils Relative: 0 %
Eosinophils Absolute: 0.2 10*3/uL (ref 0.0–0.5)
Eosinophils Relative: 2 %
HCT: 32.8 % — ABNORMAL LOW (ref 39.0–52.0)
Hemoglobin: 10.5 g/dL — ABNORMAL LOW (ref 13.0–17.0)
Immature Granulocytes: 0 %
Lymphocytes Relative: 33 %
Lymphs Abs: 2.3 10*3/uL (ref 0.7–4.0)
MCH: 27.9 pg (ref 26.0–34.0)
MCHC: 32 g/dL (ref 30.0–36.0)
MCV: 87.2 fL (ref 80.0–100.0)
Monocytes Absolute: 0.9 10*3/uL (ref 0.1–1.0)
Monocytes Relative: 12 %
Neutro Abs: 3.7 10*3/uL (ref 1.7–7.7)
Neutrophils Relative %: 53 %
Platelets: 147 10*3/uL — ABNORMAL LOW (ref 150–400)
RBC: 3.76 MIL/uL — ABNORMAL LOW (ref 4.22–5.81)
RDW: 15.9 % — ABNORMAL HIGH (ref 11.5–15.5)
WBC: 7.1 10*3/uL (ref 4.0–10.5)
nRBC: 0 % (ref 0.0–0.2)

## 2019-02-20 LAB — IRON AND TIBC
Iron: 67 ug/dL (ref 45–182)
Saturation Ratios: 28 % (ref 17.9–39.5)
TIBC: 238 ug/dL — ABNORMAL LOW (ref 250–450)
UIBC: 171 ug/dL

## 2019-02-20 LAB — FERRITIN: Ferritin: 764 ng/mL — ABNORMAL HIGH (ref 24–336)

## 2019-02-20 LAB — POCT HEMOGLOBIN-HEMACUE: Hemoglobin: 10.6 g/dL — ABNORMAL LOW (ref 13.0–17.0)

## 2019-02-20 LAB — PHOSPHORUS: Phosphorus: 4.1 mg/dL (ref 2.5–4.6)

## 2019-02-20 MED ORDER — DARBEPOETIN ALFA 40 MCG/0.4ML IJ SOSY
40.0000 ug | PREFILLED_SYRINGE | INTRAMUSCULAR | Status: DC
  Administered 2019-02-20: 40 ug via SUBCUTANEOUS

## 2019-02-20 MED ORDER — DARBEPOETIN ALFA 40 MCG/0.4ML IJ SOSY
PREFILLED_SYRINGE | INTRAMUSCULAR | Status: AC
  Filled 2019-02-20: qty 0.4

## 2019-03-07 ENCOUNTER — Ambulatory Visit (INDEPENDENT_AMBULATORY_CARE_PROVIDER_SITE_OTHER): Payer: Medicare Other | Admitting: *Deleted

## 2019-03-07 DIAGNOSIS — I5022 Chronic systolic (congestive) heart failure: Secondary | ICD-10-CM

## 2019-03-07 DIAGNOSIS — I429 Cardiomyopathy, unspecified: Secondary | ICD-10-CM

## 2019-03-07 LAB — CUP PACEART REMOTE DEVICE CHECK
Battery Remaining Longevity: 51 mo
Battery Voltage: 2.97 V
Brady Statistic AP VP Percent: 70.65 %
Brady Statistic AP VS Percent: 0.26 %
Brady Statistic AS VP Percent: 28.38 %
Brady Statistic AS VS Percent: 0.72 %
Brady Statistic RA Percent Paced: 69.72 %
Brady Statistic RV Percent Paced: 95.81 %
Date Time Interrogation Session: 20200923052404
HighPow Impedance: 61 Ohm
Implantable Lead Implant Date: 20171212
Implantable Lead Implant Date: 20171212
Implantable Lead Implant Date: 20171212
Implantable Lead Location: 753858
Implantable Lead Location: 753859
Implantable Lead Location: 753860
Implantable Lead Model: 4298
Implantable Lead Model: 5076
Implantable Pulse Generator Implant Date: 20171212
Lead Channel Impedance Value: 123.5 Ohm
Lead Channel Impedance Value: 132.321
Lead Channel Impedance Value: 136.276
Lead Channel Impedance Value: 136.276
Lead Channel Impedance Value: 147.097
Lead Channel Impedance Value: 247 Ohm
Lead Channel Impedance Value: 247 Ohm
Lead Channel Impedance Value: 247 Ohm
Lead Channel Impedance Value: 285 Ohm
Lead Channel Impedance Value: 304 Ohm
Lead Channel Impedance Value: 361 Ohm
Lead Channel Impedance Value: 361 Ohm
Lead Channel Impedance Value: 399 Ohm
Lead Channel Impedance Value: 418 Ohm
Lead Channel Impedance Value: 418 Ohm
Lead Channel Impedance Value: 418 Ohm
Lead Channel Impedance Value: 456 Ohm
Lead Channel Impedance Value: 456 Ohm
Lead Channel Pacing Threshold Amplitude: 0.625 V
Lead Channel Pacing Threshold Amplitude: 0.625 V
Lead Channel Pacing Threshold Amplitude: 0.875 V
Lead Channel Pacing Threshold Pulse Width: 0.4 ms
Lead Channel Pacing Threshold Pulse Width: 0.4 ms
Lead Channel Pacing Threshold Pulse Width: 0.4 ms
Lead Channel Sensing Intrinsic Amplitude: 17.75 mV
Lead Channel Sensing Intrinsic Amplitude: 17.75 mV
Lead Channel Sensing Intrinsic Amplitude: 4.5 mV
Lead Channel Sensing Intrinsic Amplitude: 4.5 mV
Lead Channel Setting Pacing Amplitude: 1.25 V
Lead Channel Setting Pacing Amplitude: 2 V
Lead Channel Setting Pacing Amplitude: 2.5 V
Lead Channel Setting Pacing Pulse Width: 0.4 ms
Lead Channel Setting Pacing Pulse Width: 0.4 ms
Lead Channel Setting Sensing Sensitivity: 0.3 mV

## 2019-03-13 ENCOUNTER — Encounter: Payer: Self-pay | Admitting: Cardiology

## 2019-03-13 NOTE — Progress Notes (Signed)
Remote ICD transmission.   

## 2019-03-15 NOTE — Progress Notes (Signed)
Cardiology Office Note:    Date:  03/16/2019   ID:  Matthew Filbert., DOB June 28, 1950, MRN [id number]  PCP:  Seward Carol, MD  Cardiologist:  Sinclair Grooms, MD   Referring MD: Seward Carol, MD   Chief Complaint  Patient presents with  . Congestive Heart Failure  . Coronary Artery Disease    History of Present Illness:    Matthew Moronta. is a 68 y.o. male with a hx of CAD, chronic combined systolic and diastolic heart failure, HTN, HLD, LBBB, multinodular goiter, OSA on CPAP, DM, CVA and CKD stage III-IV. Recently diagnosed to have ejection fraction by echo in the 30-35% range in the setting of ischemic cardiomyopathy and CRT-D implanted 05/2016.  Matthew Wood is doing about the same.  He denies angina.  He has dyspnea on exertion and decreased mobility.  Lower extremity swelling persists.  No angina has occurred.  He has been evaluated for kidney transplant at [facility] but turned down.  He is now preparing to have an access catheter placed for dialysis.  He has nephrology care is provided via [facility].  He denies palpitations.  He has not had syncope.  He has not had an AICD discharge..  Past Medical History:  Diagnosis Date  . Anemia   . CHF (congestive heart failure) ([place])   . Coronary artery disease    a. LHC 04/27/10 left main patent, 30-40% LAD, mid circumflex 30%, 80% mid RCA accounting for the appearance of an infract/peri-infract ischemia on nuclear testing, LV EF of 30-45%--> medical therapy  . CVA (cerebrovascular accident) ([place]) 2011   potine; "little balance problems since" (05/25/2016)  . Dyspnea   . Gout   . History of kidney stones    "passed them" (05/25/2016)  . Hyperlipidemia   . Hypertension   . LBBB (left bundle branch block)   . Multinodular goiter   . Obesity   . OSA on CPAP   . Presence of permanent cardiac pacemaker   . Renal insufficiency   . Type II diabetes mellitus ([place])     Past Surgical History:  Procedure Laterality Date  .  COLONOSCOPY WITH PROPOFOL N/A 04/16/2014   Procedure: COLONOSCOPY WITH PROPOFOL;  Surgeon: [name], MD;  Location: [facility];  Service: Endoscopy;  Laterality: N/A;  . EP IMPLANTABLE DEVICE N/A 05/25/2016   Procedure: BiV ICD Insertion CRT-D;  Surgeon: [name], MD;  Location: [place] [facility];  Service: Cardiovascular;  Laterality: N/A;  . INSERT / REPLACE / REMOVE PACEMAKER  05/25/2016   biventricular pacemaker  . LAPAROSCOPIC CHOLECYSTECTOMY      Current Medications: Current Meds  Medication Sig  . allopurinol (ZYLOPRIM) 300 MG tablet Take 300 mg by mouth daily.  Marland Kitchen amLODipine-benazepril (LOTREL) 10-40 MG per capsule Take 1 capsule by mouth every morning.   . carvedilol (COREG) 25 MG tablet Take 25 mg by mouth 2 (two) times daily with a meal.  . cloNIDine (CATAPRES) 0.2 MG tablet Take 0.1-0.2 mg by mouth See admin instructions. Take 0.1 mg by mouth in the morning and 0.2 mg in the evening  . furosemide (LASIX) 40 MG tablet Take 2 tablets (80 mg total) by mouth daily.  Marland Kitchen glimepiride (AMARYL) 4 MG tablet Take 4 mg by mouth 2 (two) times daily.  Marland Kitchen HUMALOG KWIKPEN 100 UNIT/ML KiwkPen Inject 16 Units into the skin daily after breakfast.   . Insulin Glargine (BASAGLAR KWIKPEN) 100 UNIT/ML SOPN Inject 30 Units into the skin at bedtime.   Marland Kitchen  isosorbide-hydrALAZINE (BIDIL) 20-37.5 MG tablet Take 1 tablet by mouth 2 (two) times daily.  Marland Kitchen KLOR-CON M10 10 MEQ tablet TAKE 1 TABLET (10 MEQ TOTAL) BY MOUTH 2 (TWO) TIMES DAILY.  . metolazone (ZAROXOLYN) 2.5 MG tablet Take 1 tablet by mouth once weekly if you gain 3-4 pounds in a day  . NON FORMULARY CPAP: At bedtime  . rosuvastatin (CRESTOR) 20 MG tablet Take 20 mg by mouth daily.  . [DISCONTINUED] aspirin EC 325 MG tablet Take 325 mg by mouth at bedtime.     Allergies:   Patient has no known allergies.   Social History   Socioeconomic History  . Marital status: Married    Spouse name: Not on file  . Number of children: 1   . Years of education: Not on file  . Highest education level: Not on file  Occupational History  . Occupation: Diplomatic Services operational officer: [employer]  Social Needs  . Financial resource strain: Not on file  . Food insecurity    Worry: Not on file    Inability: Not on file  . Transportation needs    Medical: Not on file    Non-medical: Not on file  Tobacco Use  . Smoking status: Former Smoker    Packs/day: 0.50    Years: 27.00    Pack years: 13.50    Types: Cigarettes    Quit date: 03/25/2010    Years since quitting: 8.9  . Smokeless tobacco: Never Used  Substance and Sexual Activity  . Alcohol use: Yes    Comment: 05/25/2016 "was an occasional drinker; maybe 4 drinks/week; stopped after stroke in 2011"  . Drug use: No  . Sexual activity: Not on file  Lifestyle  . Physical activity    Days per week: Not on file    Minutes per session: Not on file  . Stress: Not on file  Relationships  . Social Herbalist on phone: Not on file    Gets together: Not on file    Attends religious service: Not on file    Active member of club or organization: Not on file    Attends meetings of clubs or organizations: Not on file    Relationship status: Not on file  Other Topics Concern  . Not on file  Social History Narrative  . Not on file     Family History: The patient's family history includes Breast cancer in his mother; Diabetes type II in his father; Heart attack in his father; Hypertension in his mother and sister; Stroke in his father.  ROS:   Please see the history of present illness.    Somewhat depressed.  Forgetful according to his wife.  Still sleeping in the recliner.  Unable to lie in bed.  Wears CPAP but his device is more than 66 years old.  He is in the process of being reevaluated.  All other systems reviewed and are negative.  EKGs/Labs/Other Studies Reviewed:    The following studies were reviewed today:  Doppler echocardiogram performed at [facility] in May 2020: SUMMARY The left ventricle is severely dilated. There is normal left ventricular wall thickness. LV ejection fraction = 30-35%.  Left ventricular systolic function is moderately reduced. There is moderate global hypokinesis of the left ventricle.  Left ventricular filling pattern is pseudonormal. The right ventricle is mildly dilated. The right ventricular systolic function is normal. The left atrium is moderately dilated. There is  mild aortic regurgitation. There was insufficient TR detected to calculate RV systolic pressure. There is moderate size pericardial effusion. No cardiac tamponade The IVC is normal in size with an inspiratory collapse of greater then 50%,  suggesting normal right atrial pressure. There is no comparison study available. EKG:  EKG wide complex rhythm with wide QRS complex.  Cannot exclude pacing.  No obvious pacemaker activity is noted.  The rate 86 bpm.  Compared to prior study in May 2019, the QRS complex appears somewhat wider.  Recent Labs: 02/20/2019: ALT 17; BUN 94; Creatinine, Ser 5.10; Hemoglobin 10.5; Platelets 147; Potassium 3.7; Sodium 136  Recent Lipid Panel    Component Value Date/Time   CHOL  02/08/2010 0600    171        ATP III CLASSIFICATION:  <200     mg/dL   Desirable  200-239  mg/dL   Borderline High  >=240    mg/dL   High          TRIG 248 (H) 02/08/2010 0600   HDL 34 (L) 02/08/2010 0600   CHOLHDL 5.0 02/08/2010 0600   VLDL 50 (H) 02/08/2010 0600   LDLCALC  02/08/2010 0600    87        Total Cholesterol/HDL:CHD Risk Coronary Heart Disease Risk Table                     Men   Women  1/2 Average Risk   3.4   3.3  Average Risk       5.0   4.4  2 X Average Risk   9.6   7.1  3 X Average Risk  23.4   11.0        Use the calculated Patient Ratio above and the CHD Risk Table to determine the patient's CHD Risk.        ATP III CLASSIFICATION (LDL):  <100     mg/dL   Optimal   100-129  mg/dL   Near or Above                    Optimal  130-159  mg/dL   Borderline  160-189  mg/dL   High  >190     mg/dL   Very High    Physical Exam:    VS:  BP 128/72   Pulse 86   Ht 6' (1.829 m)   Wt (!) 305 lb 9.6 oz (138.6 kg)   SpO2 99%   BMI 41.45 kg/m     Wt Readings from Last 3 Encounters:  03/16/19 (!) 305 lb 9.6 oz (138.6 kg)  08/08/18 300 lb (136.1 kg)  08/01/18 300 lb (136.1 kg)     GEN: Morbidly obese. No acute distress HEENT: Normal NECK: No JVD. LYMPHATICS: No lymphadenopathy CARDIAC:  RRR without murmur, gallop, but there is bilateral 2-3+ ankle and foot edema.  Edema. VASCULAR:  Normal Pulses. No bruits. RESPIRATORY:  Clear to auscultation without rales, wheezing or rhonchi  ABDOMEN: Soft, non-tender, non-distended, No pulsatile mass, MUSCULOSKELETAL: No deformity  SKIN: Warm and dry NEUROLOGIC:  Alert and oriented x 3 PSYCHIATRIC:  Normal affect   ASSESSMENT:    1. Chronic systolic heart failure ([place])   2. Coronary artery disease involving native coronary artery of native heart without angina pectoris   3. Cardiomyopathy, ischemic   4. Essential hypertension   5. CKD (chronic kidney disease), stage IV ([place])   6. Type 2 diabetes mellitus with nephropathy ([place])  7. OSA (obstructive sleep apnea)   8. AICD (automatic cardioverter/defibrillator) present   9. Other hyperlipidemia   10. Educated about COVID-19 virus infection    PLAN:    In order of problems listed above:  1. Volume overload.  This is been difficult to control.  Kidney function is progressing.  He will be much improved once dialysis is started.  Most recent EF in May is 35% or less. 2. Secondary prevention discussed. 3. Combined ischemic and hypertensive cardiomyopathy. 4. Blood pressure control is adequate.  Target less than 140/80 mmHg.  Will improve with dialysis. 5. CKD is now stage V.  An access for dialysis is planned.  Was evaluated at the transplant center at [facility] and that is not an option due to obesity and other comorbidities. 68. Being followed by his primary care physician.  Last hemoglobin A1c was 8.8 in April. 7. Encouraged use of CPAP. 8. Followed in device clinic. 9. Not discussed 10. Social distancing, mask wearing, and handwashing is advocated.  Overall education and awareness concerning primary/secondary risk prevention was discussed in detail: LDL less than 70, hemoglobin A1c less than 7, blood pressure target less than 130/80 mmHg, >150 minutes of moderate aerobic activity per week, avoidance of smoking, weight control (via diet and exercise), and continued surveillance/management of/for obstructive sleep apnea.     Medication Adjustments/Labs and Tests Ordered: Current medicines are reviewed at length with the patient today.  Concerns regarding medicines are outlined above.  Orders Placed This Encounter  Procedures  . EKG 12-Lead   No orders of the defined types were placed in this encounter.   Patient Instructions  Medication Instructions:  1) DECREASE Aspirin to 81mg  once daily  If you need a refill on your cardiac medications before your next appointment, please call your pharmacy.   Lab work: None If you have labs (blood work) drawn today and your tests are completely normal, you will receive your results only by: Marland Kitchen MyChart Message (if you have MyChart) OR . A paper copy in the mail If you have any lab test that is abnormal or we need to change your treatment, we will call you to review the results.  Testing/Procedures: None  Follow-Up: At [facility], you and your health needs are our priority.  As part of our continuing mission to provide you with exceptional heart care, we have created designated Provider Care Teams.  These Care Teams include your primary Cardiologist (physician) and Advanced Practice Providers (APPs -  Physician Assistants and Nurse Practitioners) who all work  together to provide you with the care you need, when you need it. You will need a follow up appointment in 6 months.  Please call our office 2 months in advance to schedule this appointment.  You may see Sinclair Grooms, MD or one of the following Advanced Practice Providers on your designated Care Team:   Truitt Merle, NP Cecilie Kicks, NP . Kathyrn Drown, NP  Any Other Special Instructions Will Be Listed Below (If Applicable).       Signed, Sinclair Grooms, MD  03/16/2019 3:32 PM    [place]

## 2019-03-16 ENCOUNTER — Encounter (INDEPENDENT_AMBULATORY_CARE_PROVIDER_SITE_OTHER): Payer: Self-pay

## 2019-03-16 ENCOUNTER — Encounter: Payer: Self-pay | Admitting: Interventional Cardiology

## 2019-03-16 ENCOUNTER — Other Ambulatory Visit: Payer: Self-pay

## 2019-03-16 ENCOUNTER — Ambulatory Visit (INDEPENDENT_AMBULATORY_CARE_PROVIDER_SITE_OTHER): Payer: Medicare Other | Admitting: Interventional Cardiology

## 2019-03-16 VITALS — BP 128/72 | HR 86 | Ht 72.0 in | Wt 305.6 lb

## 2019-03-16 DIAGNOSIS — G4733 Obstructive sleep apnea (adult) (pediatric): Secondary | ICD-10-CM

## 2019-03-16 DIAGNOSIS — N184 Chronic kidney disease, stage 4 (severe): Secondary | ICD-10-CM

## 2019-03-16 DIAGNOSIS — I255 Ischemic cardiomyopathy: Secondary | ICD-10-CM

## 2019-03-16 DIAGNOSIS — Z95810 Presence of automatic (implantable) cardiac defibrillator: Secondary | ICD-10-CM

## 2019-03-16 DIAGNOSIS — I5022 Chronic systolic (congestive) heart failure: Secondary | ICD-10-CM | POA: Diagnosis not present

## 2019-03-16 DIAGNOSIS — I10 Essential (primary) hypertension: Secondary | ICD-10-CM | POA: Diagnosis not present

## 2019-03-16 DIAGNOSIS — I2510 Atherosclerotic heart disease of native coronary artery without angina pectoris: Secondary | ICD-10-CM | POA: Diagnosis not present

## 2019-03-16 DIAGNOSIS — E1121 Type 2 diabetes mellitus with diabetic nephropathy: Secondary | ICD-10-CM

## 2019-03-16 DIAGNOSIS — Z7189 Other specified counseling: Secondary | ICD-10-CM

## 2019-03-16 DIAGNOSIS — E7849 Other hyperlipidemia: Secondary | ICD-10-CM

## 2019-03-16 NOTE — Patient Instructions (Signed)
Medication Instructions:  1) DECREASE Aspirin to 81mg  once daily  If you need a refill on your cardiac medications before your next appointment, please call your pharmacy.   Lab work: None If you have labs (blood work) drawn today and your tests are completely normal, you will receive your results only by: Marland Kitchen MyChart Message (if you have MyChart) OR . A paper copy in the mail If you have any lab test that is abnormal or we need to change your treatment, we will call you to review the results.  Testing/Procedures: None  Follow-Up: At [facility], you and your health needs are our priority.  As part of our continuing mission to provide you with exceptional heart care, we have created designated Provider Care Teams.  These Care Teams include your primary Cardiologist (physician) and Advanced Practice Providers (APPs -  Physician Assistants and Nurse Practitioners) who all work together to provide you with the care you need, when you need it. You will need a follow up appointment in 6 months.  Please call our office 2 months in advance to schedule this appointment.  You may see Sinclair Grooms, MD or one of the following Advanced Practice Providers on your designated Care Team:   Truitt Merle, NP Cecilie Kicks, NP . Kathyrn Drown, NP  Any Other Special Instructions Will Be Listed Below (If Applicable).

## 2019-03-20 ENCOUNTER — Ambulatory Visit (HOSPITAL_COMMUNITY)
Admission: RE | Admit: 2019-03-20 | Discharge: 2019-03-20 | Disposition: A | Discharge status: Home / Self Care | Payer: Medicare Other | Source: Ambulatory Visit | Attending: Nephrology | Admitting: Nephrology

## 2019-03-20 ENCOUNTER — Other Ambulatory Visit: Payer: Self-pay

## 2019-03-20 VITALS — BP 102/65 | HR 67 | Temp 97.3°F | Resp 18

## 2019-03-20 DIAGNOSIS — N184 Chronic kidney disease, stage 4 (severe): Secondary | ICD-10-CM | POA: Diagnosis not present

## 2019-03-20 LAB — CBC WITH DIFFERENTIAL/PLATELET
Abs Immature Granulocytes: 0.02 10*3/uL (ref 0.00–0.07)
Basophils Absolute: 0 10*3/uL (ref 0.0–0.1)
Basophils Relative: 0 %
Eosinophils Absolute: 0.2 10*3/uL (ref 0.0–0.5)
Eosinophils Relative: 2 %
HCT: 33.3 % — ABNORMAL LOW (ref 39.0–52.0)
Hemoglobin: 10.3 g/dL — ABNORMAL LOW (ref 13.0–17.0)
Immature Granulocytes: 0 %
Lymphocytes Relative: 31 %
Lymphs Abs: 2.3 10*3/uL (ref 0.7–4.0)
MCH: 27.5 pg (ref 26.0–34.0)
MCHC: 30.9 g/dL (ref 30.0–36.0)
MCV: 89 fL (ref 80.0–100.0)
Monocytes Absolute: 0.7 10*3/uL (ref 0.1–1.0)
Monocytes Relative: 10 %
Neutro Abs: 4 10*3/uL (ref 1.7–7.7)
Neutrophils Relative %: 57 %
Platelets: 140 10*3/uL — ABNORMAL LOW (ref 150–400)
RBC: 3.74 MIL/uL — ABNORMAL LOW (ref 4.22–5.81)
RDW: 16 % — ABNORMAL HIGH (ref 11.5–15.5)
WBC: 7.2 10*3/uL (ref 4.0–10.5)
nRBC: 0 % (ref 0.0–0.2)

## 2019-03-20 LAB — POCT HEMOGLOBIN-HEMACUE: Hemoglobin: 10.4 g/dL — ABNORMAL LOW (ref 13.0–17.0)

## 2019-03-20 MED ORDER — DARBEPOETIN ALFA 40 MCG/0.4ML IJ SOSY
40.0000 ug | PREFILLED_SYRINGE | INTRAMUSCULAR | Status: DC
  Administered 2019-03-20: 13:00:00 40 ug via SUBCUTANEOUS

## 2019-03-20 MED ORDER — DARBEPOETIN ALFA 40 MCG/0.4ML IJ SOSY
PREFILLED_SYRINGE | INTRAMUSCULAR | Status: AC
  Administered 2019-03-20: 40 ug via SUBCUTANEOUS
  Filled 2019-03-20: qty 0.4

## 2019-03-22 ENCOUNTER — Other Ambulatory Visit: Payer: Self-pay

## 2019-03-22 DIAGNOSIS — N184 Chronic kidney disease, stage 4 (severe): Secondary | ICD-10-CM

## 2019-03-26 ENCOUNTER — Telehealth (HOSPITAL_COMMUNITY): Payer: Self-pay

## 2019-03-26 NOTE — Telephone Encounter (Signed)

## 2019-03-27 ENCOUNTER — Other Ambulatory Visit: Payer: Self-pay

## 2019-03-27 ENCOUNTER — Encounter: Payer: Self-pay | Admitting: Vascular Surgery

## 2019-03-27 ENCOUNTER — Encounter: Payer: Self-pay | Admitting: *Deleted

## 2019-03-27 ENCOUNTER — Ambulatory Visit (INDEPENDENT_AMBULATORY_CARE_PROVIDER_SITE_OTHER)
Admission: RE | Admit: 2019-03-27 | Discharge: 2019-03-27 | Disposition: A | Discharge status: Home / Self Care | Payer: Medicare Other | Source: Ambulatory Visit | Attending: Family | Admitting: Family

## 2019-03-27 ENCOUNTER — Ambulatory Visit (INDEPENDENT_AMBULATORY_CARE_PROVIDER_SITE_OTHER): Payer: Medicare Other | Admitting: Vascular Surgery

## 2019-03-27 ENCOUNTER — Ambulatory Visit (HOSPITAL_COMMUNITY)
Admission: RE | Admit: 2019-03-27 | Discharge: 2019-03-27 | Disposition: A | Discharge status: Home / Self Care | Payer: Medicare Other | Source: Ambulatory Visit | Attending: Family | Admitting: Family

## 2019-03-27 DIAGNOSIS — N184 Chronic kidney disease, stage 4 (severe): Secondary | ICD-10-CM | POA: Diagnosis not present

## 2019-03-27 DIAGNOSIS — N185 Chronic kidney disease, stage 5: Secondary | ICD-10-CM

## 2019-03-27 HISTORY — DX: Chronic kidney disease, stage 5: N18.5

## 2019-03-27 NOTE — Progress Notes (Signed)
Patient name: Matthew Wood. MRN: [id number] DOB: 1950-07-30 Sex: male  REASON FOR CONSULT: Evaluate for new hemodialysis access  HPI: Matthew Wood. is a 68 y.o. male, with history of coronary disease, combined systolic diastolic heart failure, hypertension, hyperlipidemia, obstructive sleep apnea on CPAP, diabetes, stage V CKD that presents for new dialysis access evaluation.  Patient states he has never had a fistula in the past.  His renal failure is related to diabetes and hypertension.  He does note that he has left arm swelling at baseline and has left chest wall implant with ICD.  Patient is retired Hotel manager previously Garment/textile technologist.  He is right handed.  No issues with weakness or numbness in upper extremities.  Past Medical History:  Diagnosis Date  . Anemia   . CHF (congestive heart failure) ([place])   . Coronary artery disease    a. LHC 04/27/10 left main patent, 30-40% LAD, mid circumflex 30%, 80% mid RCA accounting for the appearance of an infract/peri-infract ischemia on nuclear testing, LV EF of 30-45%--> medical therapy  . CVA (cerebrovascular accident) ([place]) 2011   potine; "little balance problems since" (05/25/2016)  . Dyspnea   . Gout   . History of kidney stones    "passed them" (05/25/2016)  . Hyperlipidemia   . Hypertension   . LBBB (left bundle branch block)   . Multinodular goiter   . Obesity   . OSA on CPAP   . Presence of permanent cardiac pacemaker   . Renal insufficiency   . Type II diabetes mellitus ([place])     Past Surgical History:  Procedure Laterality Date  . COLONOSCOPY WITH PROPOFOL N/A 04/16/2014   Procedure: COLONOSCOPY WITH PROPOFOL;  Surgeon: [name], MD;  [facility];  Service: Endoscopy;  Laterality: N/A;  . EP IMPLANTABLE DEVICE N/A 05/25/2016   Procedure: BiV ICD Insertion CRT-D;  Surgeon: [name], MD;  Location: [place] [facility];  Service: Cardiovascular;  Laterality: N/A;  . INSERT / REPLACE /  REMOVE PACEMAKER  05/25/2016   biventricular pacemaker  . LAPAROSCOPIC CHOLECYSTECTOMY      Family History  Problem Relation Age of Onset  . Hypertension Mother   . Breast cancer Mother   . Heart attack Father   . Stroke Father   . Diabetes type II Father   . Hypertension Sister     SOCIAL HISTORY: Social History   Socioeconomic History  . Marital status: Married    Spouse name: Not on file  . Number of children: 1  . Years of education: Not on file  . Highest education level: Not on file  Occupational History  . Occupation: Diplomatic Services operational officer: [employer]  Social Needs  . Financial resource strain: Not on file  . Food insecurity    Worry: Not on file    Inability: Not on file  . Transportation needs    Medical: Not on file    Non-medical: Not on file  Tobacco Use  . Smoking status: Former Smoker    Packs/day: 0.50    Years: 27.00    Pack years: 13.50    Types: Cigarettes    Quit date: 03/25/2010    Years since quitting: 9.0  . Smokeless tobacco: Never Used  Substance and Sexual Activity  . Alcohol use: Yes    Comment: 05/25/2016 "was an occasional drinker; maybe 4 drinks/week; stopped after stroke in 2011"  . Drug use: No  .  Sexual activity: Not on file  Lifestyle  . Physical activity    Days per week: Not on file    Minutes per session: Not on file  . Stress: Not on file  Relationships  . Social Herbalist on phone: Not on file    Gets together: Not on file    Attends religious service: Not on file    Active member of club or organization: Not on file    Attends meetings of clubs or organizations: Not on file    Relationship status: Not on file  . Intimate partner violence    Fear of current or ex partner: Not on file    Emotionally abused: Not on file    Physically abused: Not on file    Forced sexual activity: Not on file  Other Topics Concern  . Not on file  Social History Narrative  . Not on file    No Known Allergies   Current Outpatient Medications  Medication Sig Dispense Refill  . allopurinol (ZYLOPRIM) 300 MG tablet Take 300 mg by mouth daily.    Marland Kitchen amLODipine-benazepril (LOTREL) 10-40 MG per capsule Take 1 capsule by mouth every morning.     . carvedilol (COREG) 25 MG tablet Take 25 mg by mouth 2 (two) times daily with a meal.    . cloNIDine (CATAPRES) 0.2 MG tablet Take 0.1-0.2 mg by mouth See admin instructions. Take 0.1 mg by mouth in the morning and 0.2 mg in the evening    . furosemide (LASIX) 40 MG tablet Take 2 tablets (80 mg total) by mouth daily. 60 tablet 3  . glimepiride (AMARYL) 4 MG tablet Take 4 mg by mouth 2 (two) times daily.    Marland Kitchen HUMALOG KWIKPEN 100 UNIT/ML KiwkPen Inject 16 Units into the skin daily after breakfast.   5  . Insulin Glargine (BASAGLAR KWIKPEN) 100 UNIT/ML SOPN Inject 30 Units into the skin at bedtime.     . isosorbide-hydrALAZINE (BIDIL) 20-37.5 MG tablet Take 1 tablet by mouth 2 (two) times daily. 60 tablet 4  . KLOR-CON M10 10 MEQ tablet TAKE 1 TABLET (10 MEQ TOTAL) BY MOUTH 2 (TWO) TIMES DAILY. 180 tablet 3  . metolazone (ZAROXOLYN) 2.5 MG tablet Take 1 tablet by mouth once weekly if you gain 3-4 pounds in a day 15 tablet 2  . NON FORMULARY CPAP: At bedtime    . rosuvastatin (CRESTOR) 20 MG tablet Take 20 mg by mouth daily.     No current facility-administered medications for this visit.     REVIEW OF SYSTEMS:  [X]  denotes positive finding, [ ]  denotes negative finding Cardiac  Comments:  Chest pain or chest pressure:    Shortness of breath upon exertion:    Short of breath when lying flat:    Irregular heart rhythm:        Vascular    Pain in calf, thigh, or hip brought on by ambulation:    Pain in feet at night that wakes you up from your sleep:     Blood clot in your veins:    Leg swelling:         Pulmonary    Oxygen at home:    Productive cough:     Wheezing:         Neurologic    Sudden weakness in arms or legs:     Sudden numbness in arms or  legs:     Sudden onset of difficulty speaking or slurred speech:  Temporary loss of vision in one eye:     Problems with dizziness:         Gastrointestinal    Blood in stool:     Vomited blood:         Genitourinary    Burning when urinating:     Blood in urine:        Psychiatric    Major depression:         Hematologic    Bleeding problems:    Problems with blood clotting too easily:        Skin    Rashes or ulcers:        Constitutional    Fever or chills:      PHYSICAL EXAM: Vitals:   03/27/19 1447  BP: 135/74  Pulse: 75  Resp: 16  Temp: 97.9 F (36.6 C)  TempSrc: Temporal  SpO2: 100%  Weight: (!) 305 lb (138.3 kg)  Height: 6' (1.829 m)    GENERAL: The patient is a well-nourished male, in no acute distress. The vital signs are documented above. CARDIAC: There is a regular rate and rhythm.  VASCULAR:  2+ palpable radial pulse bilateral upper extremity 2+ palpable brachial pulse bilateral upper extremity PULMONARY: There is good air exchange bilaterally without wheezing or rales. ABDOMEN: Soft and non-tender with normal pitched bowel sounds.  MUSCULOSKELETAL: There are no major deformities or cyanosis. NEUROLOGIC: No focal weakness or paresthesias are detected. SKIN: There are no ulcers or rashes noted. PSYCHIATRIC: The patient has a normal affect.  DATA:   Arterial duplex shows triphasic waveforms in bilateral upper extremities.  Vein mapping shows nice cephalic veins in the bilateral upper extremities  Assessment/Plan:  68 year old male with multiple medical problems as documented above that presents with stage V chronic kidney disease and evaluation for new dialysis access.  Patient is right-handed and discussed we would normally opt for a left arm AV fistula.  However, he has a fair amount of swelling in the left arm at baseline and has a chronic indwelling chest wall implant with an ICD.  I suspect he probably has central venous stenosis or  occlusion on the left.  As a result I have recommended right arm AV fistula placement.  He does have a very nice cephalic vein all the way down to the distal forearm and could evaluate for radiocephalic versus brachiocephalic in the OR with ultrasound.  Risk and benefits were discussed with him and his wife in detail.   Discussed risk of steal, failure to mature, bleeding, infection, etc.   Marty Heck, MD [facility] Office: [phone] Pager: [phone]

## 2019-04-17 ENCOUNTER — Ambulatory Visit (HOSPITAL_COMMUNITY)
Admission: RE | Admit: 2019-04-17 | Discharge: 2019-04-17 | Disposition: A | Discharge status: Home / Self Care | Payer: Medicare Other | Source: Ambulatory Visit | Attending: Nephrology | Admitting: Nephrology

## 2019-04-17 ENCOUNTER — Other Ambulatory Visit: Payer: Self-pay

## 2019-04-17 VITALS — BP 123/67 | HR 71 | Temp 97.3°F | Resp 18

## 2019-04-17 DIAGNOSIS — N184 Chronic kidney disease, stage 4 (severe): Secondary | ICD-10-CM | POA: Diagnosis not present

## 2019-04-17 LAB — COMPREHENSIVE METABOLIC PANEL
ALT: 15 U/L (ref 0–44)
AST: 15 U/L (ref 15–41)
Albumin: 3.7 g/dL (ref 3.5–5.0)
Alkaline Phosphatase: 76 U/L (ref 38–126)
Anion gap: 14 (ref 5–15)
BUN: 69 mg/dL — ABNORMAL HIGH (ref 8–23)
CO2: 19 mmol/L — ABNORMAL LOW (ref 22–32)
Calcium: 10.1 mg/dL (ref 8.9–10.3)
Chloride: 107 mmol/L (ref 98–111)
Creatinine, Ser: 4.43 mg/dL — ABNORMAL HIGH (ref 0.61–1.24)
GFR calc Af Amer: 15 mL/min — ABNORMAL LOW (ref >60)
GFR calc non Af Amer: 13 mL/min — ABNORMAL LOW (ref >60)
Glucose, Bld: 61 mg/dL — ABNORMAL LOW (ref 70–99)
Potassium: 4 mmol/L (ref 3.5–5.1)
Sodium: 140 mmol/L (ref 135–145)
Total Bilirubin: 0.7 mg/dL (ref 0.3–1.2)
Total Protein: 7 g/dL (ref 6.5–8.1)

## 2019-04-17 LAB — PHOSPHORUS: Phosphorus: 4.5 mg/dL (ref 2.5–4.6)

## 2019-04-17 LAB — CBC WITH DIFFERENTIAL/PLATELET
Abs Immature Granulocytes: 0.02 10*3/uL (ref 0.00–0.07)
Basophils Absolute: 0 10*3/uL (ref 0.0–0.1)
Basophils Relative: 0 %
Eosinophils Absolute: 0.1 10*3/uL (ref 0.0–0.5)
Eosinophils Relative: 2 %
HCT: 32.3 % — ABNORMAL LOW (ref 39.0–52.0)
Hemoglobin: 10.2 g/dL — ABNORMAL LOW (ref 13.0–17.0)
Immature Granulocytes: 0 %
Lymphocytes Relative: 35 %
Lymphs Abs: 2.3 10*3/uL (ref 0.7–4.0)
MCH: 27.7 pg (ref 26.0–34.0)
MCHC: 31.6 g/dL (ref 30.0–36.0)
MCV: 87.8 fL (ref 80.0–100.0)
Monocytes Absolute: 0.8 10*3/uL (ref 0.1–1.0)
Monocytes Relative: 11 %
Neutro Abs: 3.4 10*3/uL (ref 1.7–7.7)
Neutrophils Relative %: 52 %
Platelets: 175 10*3/uL (ref 150–400)
RBC: 3.68 MIL/uL — ABNORMAL LOW (ref 4.22–5.81)
RDW: 16.4 % — ABNORMAL HIGH (ref 11.5–15.5)
WBC: 6.7 10*3/uL (ref 4.0–10.5)
nRBC: 0 % (ref 0.0–0.2)

## 2019-04-17 LAB — POCT HEMOGLOBIN-HEMACUE: Hemoglobin: 10.4 g/dL — ABNORMAL LOW (ref 13.0–17.0)

## 2019-04-17 LAB — IRON AND TIBC
Iron: 49 ug/dL (ref 45–182)
Saturation Ratios: 21 % (ref 17.9–39.5)
TIBC: 235 ug/dL — ABNORMAL LOW (ref 250–450)
UIBC: 186 ug/dL

## 2019-04-17 LAB — FERRITIN: Ferritin: 608 ng/mL — ABNORMAL HIGH (ref 24–336)

## 2019-04-17 MED ORDER — DARBEPOETIN ALFA 40 MCG/0.4ML IJ SOSY
PREFILLED_SYRINGE | INTRAMUSCULAR | Status: AC
  Filled 2019-04-17: qty 0.4

## 2019-04-17 MED ORDER — DARBEPOETIN ALFA 40 MCG/0.4ML IJ SOSY
40.0000 ug | PREFILLED_SYRINGE | INTRAMUSCULAR | Status: DC
  Administered 2019-04-17: 13:00:00 40 ug via SUBCUTANEOUS

## 2019-04-30 ENCOUNTER — Other Ambulatory Visit: Payer: Self-pay | Admitting: *Deleted

## 2019-05-15 ENCOUNTER — Ambulatory Visit (HOSPITAL_COMMUNITY)
Admission: RE | Admit: 2019-05-15 | Discharge: 2019-05-15 | Disposition: A | Discharge status: Home / Self Care | Payer: Medicare Other | Source: Ambulatory Visit | Attending: Nephrology | Admitting: Nephrology

## 2019-05-15 ENCOUNTER — Other Ambulatory Visit: Payer: Self-pay

## 2019-05-15 VITALS — BP 111/67 | HR 70 | Temp 97.3°F | Resp 16 | Ht 72.0 in | Wt 300.0 lb

## 2019-05-15 DIAGNOSIS — N184 Chronic kidney disease, stage 4 (severe): Secondary | ICD-10-CM

## 2019-05-15 LAB — CBC WITH DIFFERENTIAL/PLATELET
Abs Immature Granulocytes: 0.01 10*3/uL (ref 0.00–0.07)
Basophils Absolute: 0 10*3/uL (ref 0.0–0.1)
Basophils Relative: 0 %
Eosinophils Absolute: 0.2 10*3/uL (ref 0.0–0.5)
Eosinophils Relative: 3 %
HCT: 34.5 % — ABNORMAL LOW (ref 39.0–52.0)
Hemoglobin: 10.8 g/dL — ABNORMAL LOW (ref 13.0–17.0)
Immature Granulocytes: 0 %
Lymphocytes Relative: 35 %
Lymphs Abs: 2.5 10*3/uL (ref 0.7–4.0)
MCH: 27.3 pg (ref 26.0–34.0)
MCHC: 31.3 g/dL (ref 30.0–36.0)
MCV: 87.1 fL (ref 80.0–100.0)
Monocytes Absolute: 0.8 10*3/uL (ref 0.1–1.0)
Monocytes Relative: 11 %
Neutro Abs: 3.7 10*3/uL (ref 1.7–7.7)
Neutrophils Relative %: 51 %
Platelets: 165 10*3/uL (ref 150–400)
RBC: 3.96 MIL/uL — ABNORMAL LOW (ref 4.22–5.81)
RDW: 16.5 % — ABNORMAL HIGH (ref 11.5–15.5)
WBC: 7.2 10*3/uL (ref 4.0–10.5)
nRBC: 0 % (ref 0.0–0.2)

## 2019-05-15 LAB — POCT HEMOGLOBIN-HEMACUE: Hemoglobin: 12 g/dL — ABNORMAL LOW (ref 13.0–17.0)

## 2019-05-15 MED ORDER — DARBEPOETIN ALFA 40 MCG/0.4ML IJ SOSY: 40.0000 ug | PREFILLED_SYRINGE | INTRAMUSCULAR | Status: DC

## 2019-05-22 ENCOUNTER — Other Ambulatory Visit (HOSPITAL_COMMUNITY)
Admission: RE | Admit: 2019-05-22 | Discharge: 2019-05-22 | Disposition: A | Discharge status: Home / Self Care | Payer: Medicare Other | Source: Ambulatory Visit | Attending: Vascular Surgery | Admitting: Vascular Surgery

## 2019-05-22 DIAGNOSIS — Z01812 Encounter for preprocedural laboratory examination: Secondary | ICD-10-CM | POA: Diagnosis present

## 2019-05-22 DIAGNOSIS — Z20828 Contact with and (suspected) exposure to other viral communicable diseases: Secondary | ICD-10-CM | POA: Insufficient documentation

## 2019-05-23 ENCOUNTER — Encounter (HOSPITAL_COMMUNITY): Payer: Self-pay | Admitting: *Deleted

## 2019-05-23 LAB — NOVEL CORONAVIRUS, NAA (HOSP ORDER, SEND-OUT TO REF LAB; TAT 18-24 HRS): SARS-CoV-2, NAA: NOT DETECTED

## 2019-05-23 NOTE — Progress Notes (Addendum)
Denies chest pain or shob. Cardiologist is Dr. Tamala Julian. ICD order form sent to device clinic. [employer] reps emailed. Reports compliance with quarantine post COVID test collection. Requested anesthesia review cardiac history. Below instructions provided regarding DM management.  . If your blood sugar is less than 70 mg/dL, you will need to treat for low blood sugar: o Do not take insulin. o Treat a low blood sugar (less than 70 mg/dL) with  cup of clear juice (cranberry or apple), 4 glucose tablets, OR glucose gel. Recheck blood sugar in 15 minutes after treatment (to make sure it is greater than 70 mg/dL). If your blood sugar is not greater than 70 mg/dL on recheck, call [phone] o  for further instructions. . Report your blood sugar to the short stay nurse when you get to Short Stay.    WHAT DO I DO ABOUT MY DIABETES MEDICATION?   Marland Kitchen Do not take oral diabetes medicines (pills) the morning of surgery.  . THE NIGHT BEFORE SURGERY, take ___20________ units of _Lantus__________insulin.       . If your CBG is greater than 220 mg/dL, you may take  of your sliding scale (correction) dose of insulin.

## 2019-05-24 MED ORDER — DEXTROSE 5 % IV SOLN
3.0000 g | INTRAVENOUS | Status: AC
  Administered 2019-05-25: 3 g via INTRAVENOUS
  Filled 2019-05-24: qty 3000
  Filled 2019-05-24: qty 3

## 2019-05-24 NOTE — Anesthesia Preprocedure Evaluation (Addendum)
Anesthesia Evaluation  Patient identified by MRN, date of birth, ID band Patient awake    Reviewed: Allergy & Precautions, NPO status , Patient's Chart, lab work & pertinent test results, reviewed documented beta blocker date and time   History of Anesthesia Complications Negative for: history of anesthetic complications  Airway Mallampati: II  TM Distance: >3 FB Neck ROM: Full    Dental  (+) Dental Advisory Given, Partial Lower, Partial Upper   Pulmonary sleep apnea and Continuous Positive Airway Pressure Ventilation , former smoker,    Pulmonary exam normal        Cardiovascular hypertension, Pt. on medications and Pt. on home beta blockers + CAD and +CHF  Normal cardiovascular exam+ Cardiac Defibrillator    Carotid aneurysm  '17 TTE - LV cavity size was moderately dilated. Mild LVH. EF 30% to 35%. Diffuse hypokinesis. Grade 2 diastolic dysfunction. Mild AI. Mildly dilated aortic root and ascending aorta. Aortic root dimension: 39 mm (ED). Ascending aortic diameter: 42 mm (S). Mild MR. LA was moderately dilated. RA was mildly dilated. A trivial pericardial effusion was identified posterior to the heart.  He was last evaluated by cardiologist Dr. Tamala Julian on 03/16/19.  He was aware patient would need upcoming hemodialysis access.  No angina, palpitations, syncope or ICD discharges.  DOE and LE edema persisted.  He notes that patient's volume status has been difficult to control given progressive kidney function.  He anticipates it will be much improved once he begins hemodialysis.    Neuro/Psych CVA, Residual Symptoms negative psych ROS   GI/Hepatic negative GI ROS, Neg liver ROS,   Endo/Other  diabetes, Type 2, Insulin DependentMorbid obesity  Renal/GU ESRFRenal disease     Musculoskeletal  (+) Arthritis ,  Gout    Abdominal   Peds  Hematology  (+) anemia ,   Anesthesia Other Findings Covid neg 12/8  Reproductive/Obstetrics                           Anesthesia Physical Anesthesia Plan  ASA: IV  Anesthesia Plan: MAC   Post-op Pain Management:    Induction: Intravenous  PONV Risk Score and Plan: 1 and Propofol infusion and Treatment may vary due to age or medical condition  Airway Management Planned: Natural Airway and Simple Face Mask  Additional Equipment: None  Intra-op Plan:   Post-operative Plan:   Informed Consent: I have reviewed the patients History and Physical, chart, labs and discussed the procedure including the risks, benefits and alternatives for the proposed anesthesia with the patient or authorized representative who has indicated his/her understanding and acceptance.       Plan Discussed with: CRNA and Anesthesiologist  Anesthesia Plan Comments:      Anesthesia Quick Evaluation

## 2019-05-24 NOTE — Progress Notes (Signed)
Anesthesia Chart Review: Matthew Wood   Case: [id number] Date/Time: 05/25/19 0715   Procedure: ARTERIOVENOUS (AV) FISTULA CREATION RIGHT ARM (Right )   Anesthesia type: Choice   Pre-op diagnosis: CHRONIC KIDNEY DISEASE   Location: [facility] OR ROOM 11 / [facility]   Surgeons: Marty Heck, MD      DISCUSSION: Patient is a 68 year old male scheduled for the above procedure.    History includes former smoker (quit 03/25/10), HTN, CAD (80% mRCA 04/27/10, medical therapy), combined chronic systolic and diastolic CHF (EF 21-30% 86/5784, 30-35% 02/2016, 08/2018), Medtronic BiV ICD CRT-D (05/25/16, left chest), left BBB, HLD, multinodular goiter, DM2, anemia, OSA (CPAP), dyspnea, CVA (2011).   He was last evaluated by cardiologist Dr. Tamala Julian on 03/16/19.  He was aware patient would need upcoming hemodialysis access.  No angina, palpitations, syncope or ICD discharges.  DOE and LE edema persisted.  He notes that patient's volume status has been difficult to control given progressive kidney function.  He anticipates it will be much improved once he begins hemodialysis.  05/22/2019 COVID-19 test was negative.  Patient is a same day work-up so he will get labs and anesthesia team evaluation on the day of surgery.    PROVIDERS: Seward Carol, MD is PCP Daneen Schick, MD is cardiologist Allegra Lai, MD is EP cardiologist. Last note seen was 09/06/17, with transmission on 03/07/19 that showed normal device function.  Donato Heinz, MD is nephrologist.  Reportedly patient was turned down for renal transplant at [facility]    LABS: He is for ISTAT on the day of surgery. H/H 10.8/34.5 on 05/15/19.    IMAGES: Thyroid US 11/17/18: IMPRESSION: 1. Confirmed greater than 5 year stability of the previously biopsied left thyroid mass consistent with a benign process. 2. Additional sonographically benign thyroid cysts identified in the isthmus and right mid gland. No further follow-up required.   EKG: 03/16/19: AV  dual paced rhythm with prolonged AV conduction   CV: Echo 08/30/18 [facility]): SUMMARY The left ventricle is severely dilated. There is normal left ventricular wall thickness. LV ejection fraction = 30-35%. Left ventricular systolic function is moderately reduced. There is moderate global hypokinesis of the left ventricle. Left ventricular filling pattern is pseudonormal. The right ventricle is mildly dilated. The right ventricular systolic function is normal. The left atrium is moderately dilated. There is mild aortic regurgitation. There was insufficient TR detected to calculate RV systolic pressure. There is moderate size pericardial effusion. No cardiac tamponade The IVC is normal in size with an inspiratory collapse of greater then 50%,  suggesting normal right atrial pressure. There is no comparison study available. (Comparison EF 30-35% 02/19/16, 35-45% 04/2010)   Cardiac cath 04/27/10: Conclusion: 1.  Obstruction 80% in the mid right coronary accounting for the appearance of an infarct/peri-infarct ischemia on nuclear testing. 2.  Widely patent left coronary system. 3.  Left ventriculography not performed but the patient is known to have moderate left ventricular dysfunction with ejection fraction in the 35 to 45% range. Plan: Aggressive medical therapy, and consider intervention on the right coronary if the patient develops symptoms.  For the time being, we will treat this patient medically to avoid further contrast use that may impair renal function.   Past Medical History:  Diagnosis Date  . Anemia   . CHF (congestive heart failure) ([place])   . Coronary artery disease    a. LHC 04/27/10 left main patent, 30-40% LAD, mid circumflex 30%, 80% mid RCA accounting for the appearance of an  infract/peri-infract ischemia on nuclear testing, LV EF of 30-45%--> medical therapy  . CVA (cerebrovascular accident) ([place]) 2011   potine; "little balance problems since" (05/25/2016)  .  Dyspnea   . Gout   . History of kidney stones    "passed them" (05/25/2016)  . Hyperlipidemia   . Hypertension   . LBBB (left bundle branch block)   . Multinodular goiter   . Obesity   . OSA on CPAP   . Presence of permanent cardiac pacemaker   . Renal insufficiency   . Type II diabetes mellitus ([place])     Past Surgical History:  Procedure Laterality Date  . COLONOSCOPY WITH PROPOFOL N/A 04/16/2014   Procedure: COLONOSCOPY WITH PROPOFOL;  Surgeon: [name], MD;  Location: [facility];  Service: Endoscopy;  Laterality: N/A;  . EP IMPLANTABLE DEVICE N/A 05/25/2016   Procedure: BiV ICD Insertion CRT-D;  Surgeon: [name], MD;  Location: [facility];  Service: Cardiovascular;  Laterality: N/A;  . INSERT / REPLACE / REMOVE PACEMAKER  05/25/2016   biventricular pacemaker  . LAPAROSCOPIC CHOLECYSTECTOMY      MEDICATIONS: . [START ON 05/25/2019] ceFAZolin (ANCEF) 3 g in dextrose 5 % 50 mL IVPB   . allopurinol (ZYLOPRIM) 300 MG tablet  . amLODipine-benazepril (LOTREL) 10-40 MG per capsule  . aspirin EC 81 MG tablet  . carvedilol (COREG) 25 MG tablet  . cloNIDine (CATAPRES) 0.2 MG tablet  . furosemide (LASIX) 80 MG tablet  . HUMALOG KWIKPEN 100 UNIT/ML KiwkPen  . insulin glargine (LANTUS) 100 UNIT/ML injection  . isosorbide-hydrALAZINE (BIDIL) 20-37.5 MG tablet  . KLOR-CON M10 10 MEQ tablet  . metolazone (ZAROXOLYN) 2.5 MG tablet  . rosuvastatin (CRESTOR) 20 MG tablet  . NON FORMULARY     Myra Gianotti, PA-C Surgical Short Stay/Anesthesiology [facility] Phone [phone] [facility] Phone [phone] 05/24/2019 12:08 PM

## 2019-05-25 ENCOUNTER — Encounter (HOSPITAL_COMMUNITY): Payer: Self-pay | Admitting: Vascular Surgery

## 2019-05-25 ENCOUNTER — Ambulatory Visit (HOSPITAL_COMMUNITY): Payer: Medicare Other | Admitting: Vascular Surgery

## 2019-05-25 ENCOUNTER — Other Ambulatory Visit: Payer: Self-pay

## 2019-05-25 ENCOUNTER — Encounter (HOSPITAL_COMMUNITY)
Admission: RE | Disposition: A | Discharge status: Home / Self Care | Payer: Self-pay | Source: Ambulatory Visit | Attending: Vascular Surgery

## 2019-05-25 ENCOUNTER — Ambulatory Visit (HOSPITAL_COMMUNITY)
Admission: RE | Admit: 2019-05-25 | Discharge: 2019-05-25 | Disposition: A | Discharge status: Home / Self Care | Payer: Medicare Other | Source: Ambulatory Visit | Attending: Vascular Surgery | Admitting: Vascular Surgery

## 2019-05-25 DIAGNOSIS — E785 Hyperlipidemia, unspecified: Secondary | ICD-10-CM | POA: Insufficient documentation

## 2019-05-25 DIAGNOSIS — E669 Obesity, unspecified: Secondary | ICD-10-CM | POA: Diagnosis not present

## 2019-05-25 DIAGNOSIS — Z950 Presence of cardiac pacemaker: Secondary | ICD-10-CM | POA: Insufficient documentation

## 2019-05-25 DIAGNOSIS — I5042 Chronic combined systolic (congestive) and diastolic (congestive) heart failure: Secondary | ICD-10-CM | POA: Insufficient documentation

## 2019-05-25 DIAGNOSIS — E1122 Type 2 diabetes mellitus with diabetic chronic kidney disease: Secondary | ICD-10-CM | POA: Insufficient documentation

## 2019-05-25 DIAGNOSIS — I2510 Atherosclerotic heart disease of native coronary artery without angina pectoris: Secondary | ICD-10-CM | POA: Diagnosis not present

## 2019-05-25 DIAGNOSIS — Z794 Long term (current) use of insulin: Secondary | ICD-10-CM | POA: Diagnosis not present

## 2019-05-25 DIAGNOSIS — Z8249 Family history of ischemic heart disease and other diseases of the circulatory system: Secondary | ICD-10-CM | POA: Insufficient documentation

## 2019-05-25 DIAGNOSIS — Z87891 Personal history of nicotine dependence: Secondary | ICD-10-CM | POA: Diagnosis not present

## 2019-05-25 DIAGNOSIS — I132 Hypertensive heart and chronic kidney disease with heart failure and with stage 5 chronic kidney disease, or end stage renal disease: Secondary | ICD-10-CM | POA: Insufficient documentation

## 2019-05-25 DIAGNOSIS — Z6841 Body Mass Index (BMI) 40.0 and over, adult: Secondary | ICD-10-CM | POA: Insufficient documentation

## 2019-05-25 DIAGNOSIS — Z79899 Other long term (current) drug therapy: Secondary | ICD-10-CM | POA: Diagnosis not present

## 2019-05-25 DIAGNOSIS — G4733 Obstructive sleep apnea (adult) (pediatric): Secondary | ICD-10-CM | POA: Insufficient documentation

## 2019-05-25 DIAGNOSIS — N185 Chronic kidney disease, stage 5: Secondary | ICD-10-CM

## 2019-05-25 DIAGNOSIS — Z8673 Personal history of transient ischemic attack (TIA), and cerebral infarction without residual deficits: Secondary | ICD-10-CM | POA: Insufficient documentation

## 2019-05-25 DIAGNOSIS — M109 Gout, unspecified: Secondary | ICD-10-CM | POA: Diagnosis not present

## 2019-05-25 HISTORY — PX: AV FISTULA PLACEMENT: SHX1204

## 2019-05-25 LAB — POCT I-STAT, CHEM 8
BUN: >140 mg/dL — ABNORMAL HIGH (ref 8–23)
Calcium, Ion: 1.26 mmol/L (ref 1.15–1.40)
Chloride: 104 mmol/L (ref 98–111)
Creatinine, Ser: 6.3 mg/dL — ABNORMAL HIGH (ref 0.61–1.24)
Glucose, Bld: 157 mg/dL — ABNORMAL HIGH (ref 70–99)
HCT: 32 % — ABNORMAL LOW (ref 39.0–52.0)
Hemoglobin: 10.9 g/dL — ABNORMAL LOW (ref 13.0–17.0)
Potassium: 3.4 mmol/L — ABNORMAL LOW (ref 3.5–5.1)
Sodium: 139 mmol/L (ref 135–145)
TCO2: 21 mmol/L — ABNORMAL LOW (ref 22–32)

## 2019-05-25 LAB — GLUCOSE, CAPILLARY
Glucose-Capillary: 151 mg/dL — ABNORMAL HIGH (ref 70–99)
Glucose-Capillary: 98 mg/dL (ref 70–99)

## 2019-05-25 SURGERY — ARTERIOVENOUS (AV) FISTULA CREATION
Anesthesia: Monitor Anesthesia Care | Site: Arm Lower | Laterality: Right

## 2019-05-25 MED ORDER — PROPOFOL 10 MG/ML IV BOLUS
INTRAVENOUS | Status: AC
  Filled 2019-05-25: qty 20

## 2019-05-25 MED ORDER — CHLORHEXIDINE GLUCONATE 4 % EX LIQD: 60.0000 mL | Freq: Once | CUTANEOUS | Status: DC

## 2019-05-25 MED ORDER — SODIUM CHLORIDE 0.9 % IV SOLN
INTRAVENOUS | Status: AC
  Filled 2019-05-25: qty 1.2

## 2019-05-25 MED ORDER — PHENYLEPHRINE 40 MCG/ML (10ML) SYRINGE FOR IV PUSH (FOR BLOOD PRESSURE SUPPORT)
PREFILLED_SYRINGE | INTRAVENOUS | Status: DC | PRN
  Administered 2019-05-25: 40 ug via INTRAVENOUS

## 2019-05-25 MED ORDER — EPHEDRINE 5 MG/ML INJ
INTRAVENOUS | Status: AC
  Filled 2019-05-25: qty 10

## 2019-05-25 MED ORDER — HEPARIN SODIUM (PORCINE) 1000 UNIT/ML IJ SOLN
INTRAMUSCULAR | Status: DC | PRN
  Administered 2019-05-25: 3000 [IU] via INTRAVENOUS

## 2019-05-25 MED ORDER — LIDOCAINE HCL 1 % IJ SOLN
INTRAMUSCULAR | Status: DC | PRN
  Administered 2019-05-25: 8 mL

## 2019-05-25 MED ORDER — FENTANYL CITRATE (PF) 100 MCG/2ML IJ SOLN
INTRAMUSCULAR | Status: DC | PRN
  Administered 2019-05-25: 100 ug via INTRAVENOUS

## 2019-05-25 MED ORDER — MIDAZOLAM HCL 2 MG/2ML IJ SOLN
INTRAMUSCULAR | Status: AC
  Filled 2019-05-25: qty 2

## 2019-05-25 MED ORDER — 0.9 % SODIUM CHLORIDE (POUR BTL) OPTIME
TOPICAL | Status: DC | PRN
  Administered 2019-05-25: 1000 mL

## 2019-05-25 MED ORDER — PHENYLEPHRINE 40 MCG/ML (10ML) SYRINGE FOR IV PUSH (FOR BLOOD PRESSURE SUPPORT)
PREFILLED_SYRINGE | INTRAVENOUS | Status: AC
  Filled 2019-05-25: qty 10

## 2019-05-25 MED ORDER — SODIUM CHLORIDE 0.9 % IV SOLN
INTRAVENOUS | Status: DC
  Administered 2019-05-25: 07:00:00 via INTRAVENOUS

## 2019-05-25 MED ORDER — EPHEDRINE SULFATE-NACL 50-0.9 MG/10ML-% IV SOSY
PREFILLED_SYRINGE | INTRAVENOUS | Status: DC | PRN
  Administered 2019-05-25: 10 mg via INTRAVENOUS
  Administered 2019-05-25: 5 mg via INTRAVENOUS
  Administered 2019-05-25: 10 mg via INTRAVENOUS
  Administered 2019-05-25 (×2): 5 mg via INTRAVENOUS

## 2019-05-25 MED ORDER — LIDOCAINE HCL (PF) 1 % IJ SOLN
INTRAMUSCULAR | Status: AC
  Filled 2019-05-25: qty 30

## 2019-05-25 MED ORDER — FENTANYL CITRATE (PF) 250 MCG/5ML IJ SOLN
INTRAMUSCULAR | Status: AC
  Filled 2019-05-25: qty 5

## 2019-05-25 MED ORDER — SODIUM CHLORIDE 0.9 % IV SOLN
INTRAVENOUS | Status: DC | PRN
  Administered 2019-05-25: 08:00:00

## 2019-05-25 MED ORDER — STERILE WATER FOR IRRIGATION IR SOLN
Status: DC | PRN
  Administered 2019-05-25: 1000 mL

## 2019-05-25 MED ORDER — HYDROCODONE-ACETAMINOPHEN 5-325 MG PO TABS: 1.0000 | ORAL_TABLET | Freq: Four times a day (QID) | ORAL | 0 refills | Status: DC | PRN

## 2019-05-25 MED ORDER — MIDAZOLAM HCL 5 MG/5ML IJ SOLN
INTRAMUSCULAR | Status: DC | PRN
  Administered 2019-05-25: 2 mg via INTRAVENOUS

## 2019-05-25 MED ORDER — PROPOFOL 500 MG/50ML IV EMUL
INTRAVENOUS | Status: DC | PRN
  Administered 2019-05-25: 25 ug/kg/min via INTRAVENOUS

## 2019-05-25 MED ORDER — HEPARIN SODIUM (PORCINE) 1000 UNIT/ML IJ SOLN
INTRAMUSCULAR | Status: AC
  Filled 2019-05-25: qty 1

## 2019-05-25 SURGICAL SUPPLY — 32 items
ARMBAND PINK RESTRICT EXTREMIT (MISCELLANEOUS) ×4 IMPLANT
CANISTER SUCT 3000ML PPV (MISCELLANEOUS) ×2 IMPLANT
CLIP VESOCCLUDE MED 6/CT (CLIP) ×2 IMPLANT
CLIP VESOCCLUDE SM WIDE 6/CT (CLIP) ×2 IMPLANT
COVER PROBE W GEL 5X96 (DRAPES) ×2 IMPLANT
COVER WAND RF STERILE (DRAPES) ×2 IMPLANT
DECANTER SPIKE VIAL GLASS SM (MISCELLANEOUS) ×2 IMPLANT
DERMABOND ADVANCED (GAUZE/BANDAGES/DRESSINGS) ×1
DERMABOND ADVANCED .7 DNX12 (GAUZE/BANDAGES/DRESSINGS) ×1 IMPLANT
ELECT REM PT RETURN 9FT ADLT (ELECTROSURGICAL) ×2
ELECTRODE REM PT RTRN 9FT ADLT (ELECTROSURGICAL) ×1 IMPLANT
GLOVE BIO SURGEON STRL SZ7.5 (GLOVE) ×2 IMPLANT
GLOVE BIOGEL PI IND STRL 8 (GLOVE) ×1 IMPLANT
GLOVE BIOGEL PI INDICATOR 8 (GLOVE) ×1
GOWN STRL REUS W/ TWL LRG LVL3 (GOWN DISPOSABLE) ×2 IMPLANT
GOWN STRL REUS W/ TWL XL LVL3 (GOWN DISPOSABLE) ×2 IMPLANT
GOWN STRL REUS W/TWL LRG LVL3 (GOWN DISPOSABLE) ×2
GOWN STRL REUS W/TWL XL LVL3 (GOWN DISPOSABLE) ×2
HEMOSTAT SPONGE AVITENE ULTRA (HEMOSTASIS) IMPLANT
KIT BASIN OR (CUSTOM PROCEDURE TRAY) ×2 IMPLANT
KIT TURNOVER KIT B (KITS) ×2 IMPLANT
NS IRRIG 1000ML POUR BTL (IV SOLUTION) ×2 IMPLANT
PACK CV ACCESS (CUSTOM PROCEDURE TRAY) ×2 IMPLANT
PAD ARMBOARD 7.5X6 YLW CONV (MISCELLANEOUS) ×4 IMPLANT
SUT MNCRL AB 4-0 PS2 18 (SUTURE) ×2 IMPLANT
SUT PROLENE 6 0 BV (SUTURE) ×2 IMPLANT
SUT PROLENE 7 0 BV 1 (SUTURE) IMPLANT
SUT VIC AB 3-0 SH 27 (SUTURE) ×1
SUT VIC AB 3-0 SH 27X BRD (SUTURE) ×1 IMPLANT
TOWEL GREEN STERILE (TOWEL DISPOSABLE) ×2 IMPLANT
UNDERPAD 30X30 (UNDERPADS AND DIAPERS) ×2 IMPLANT
WATER STERILE IRR 1000ML POUR (IV SOLUTION) ×2 IMPLANT

## 2019-05-25 NOTE — H&P (Signed)
Patient name: Matthew Wood. MRN: [id number] DOB: [birth year] Sex: male   REASON FOR CONSULT: Evaluate for new hemodialysis access   HPI:  Matthew Wood. is a 68 y.o. male, with history of coronary disease, combined systolic diastolic heart failure, hypertension, hyperlipidemia, obstructive sleep apnea on CPAP, diabetes, stage V CKD that presents for new dialysis access evaluation. Patient states he has never had a fistula in the past. His renal failure is related to diabetes and hypertension. He does note that he has left arm swelling at baseline and has left chest wall implant with ICD. Patient is retired Hotel manager previously Garment/textile technologist. He is right handed. No issues with weakness or numbness in upper extremities.       Past Medical History:  Diagnosis Date  . Anemia   . CHF (congestive heart failure) ([place])   . Coronary artery disease    a. LHC 04/27/10 left main patent, 30-40% LAD, mid circumflex 30%, 80% mid RCA accounting for the appearance of an infract/peri-infract ischemia on nuclear testing, LV EF of 30-45%--> medical therapy  . CVA (cerebrovascular accident) ([place]) 2011   potine; "little balance problems since" (05/25/2016)  . Dyspnea   . Gout   . History of kidney stones    "passed them" (05/25/2016)  . Hyperlipidemia   . Hypertension   . LBBB (left bundle branch block)   . Multinodular goiter   . Obesity   . OSA on CPAP   . Presence of permanent cardiac pacemaker   . Renal insufficiency   . Type II diabetes mellitus ([place])         Past Surgical History:  Procedure Laterality Date  . COLONOSCOPY WITH PROPOFOL N/A 04/16/2014   Procedure: COLONOSCOPY WITH PROPOFOL; Surgeon: [name], MD; Location: [facility]; Service: Endoscopy; Laterality: N/A;  . EP IMPLANTABLE DEVICE N/A 05/25/2016   Procedure: BiV ICD Insertion CRT-D; Surgeon: [name], MD; Location: [facility]; Service: Cardiovascular; Laterality: N/A;  . INSERT / REPLACE / REMOVE  PACEMAKER  05/25/2016   biventricular pacemaker  . LAPAROSCOPIC CHOLECYSTECTOMY          Family History  Problem Relation Age of Onset  . Hypertension Mother   . Breast cancer Mother   . Heart attack Father   . Stroke Father   . Diabetes type II Father   . Hypertension Sister    SOCIAL HISTORY:  Social History        Socioeconomic History  . Marital status: Married    Spouse name: Not on file  . Number of children: 1  . Years of education: Not on file  . Highest education level: Not on file  Occupational History  . Occupation: Diplomatic Services operational officer: [employer]  Social Needs  . Financial resource strain: Not on file  . Food insecurity    Worry: Not on file    Inability: Not on file  . Transportation needs    Medical: Not on file    Non-medical: Not on file  Tobacco Use  . Smoking status: Former Smoker    Packs/day: 0.50    Years: 27.00    Pack years: 13.50    Types: Cigarettes    Quit date: 03/25/2010    Years since quitting: 9.0  . Smokeless tobacco: Never Used  Substance and Sexual Activity  . Alcohol use: Yes    Comment: 05/25/2016 "was an occasional drinker; maybe 4 drinks/week; stopped after stroke in 2011"  .  Drug use: No  . Sexual activity: Not on file  Lifestyle  . Physical activity    Days per week: Not on file    Minutes per session: Not on file  . Stress: Not on file  Relationships  . Social Herbalist on phone: Not on file    Gets together: Not on file    Attends religious service: Not on file    Active member of club or organization: Not on file    Attends meetings of clubs or organizations: Not on file    Relationship status: Not on file  . Intimate partner violence    Fear of current or ex partner: Not on file    Emotionally abused: Not on file    Physically abused: Not on file    Forced sexual activity: Not on file  Other Topics Concern  . Not on file  Social History Narrative  . Not on file   No Known Allergies         Current Outpatient Medications  Medication Sig Dispense Refill  . allopurinol (ZYLOPRIM) 300 MG tablet Take 300 mg by mouth daily.    Marland Kitchen amLODipine-benazepril (LOTREL) 10-40 MG per capsule Take 1 capsule by mouth every morning.     . carvedilol (COREG) 25 MG tablet Take 25 mg by mouth 2 (two) times daily with a meal.    . cloNIDine (CATAPRES) 0.2 MG tablet Take 0.1-0.2 mg by mouth See admin instructions. Take 0.1 mg by mouth in the morning and 0.2 mg in the evening    . furosemide (LASIX) 40 MG tablet Take 2 tablets (80 mg total) by mouth daily. 60 tablet 3  . glimepiride (AMARYL) 4 MG tablet Take 4 mg by mouth 2 (two) times daily.    Marland Kitchen HUMALOG KWIKPEN 100 UNIT/ML KiwkPen Inject 16 Units into the skin daily after breakfast.   5  . Insulin Glargine (BASAGLAR KWIKPEN) 100 UNIT/ML SOPN Inject 30 Units into the skin at bedtime.     . isosorbide-hydrALAZINE (BIDIL) 20-37.5 MG tablet Take 1 tablet by mouth 2 (two) times daily. 60 tablet 4  . KLOR-CON M10 10 MEQ tablet TAKE 1 TABLET (10 MEQ TOTAL) BY MOUTH 2 (TWO) TIMES DAILY. 180 tablet 3  . metolazone (ZAROXOLYN) 2.5 MG tablet Take 1 tablet by mouth once weekly if you gain 3-4 pounds in a day 15 tablet 2  . NON FORMULARY CPAP: At bedtime    . rosuvastatin (CRESTOR) 20 MG tablet Take 20 mg by mouth daily.     No current facility-administered medications for this visit.    REVIEW OF SYSTEMS:  [X]  denotes positive finding, [ ]  denotes negative finding  Cardiac  Comments:  Chest pain or chest pressure:    Shortness of breath upon exertion:    Short of breath when lying flat:    Irregular heart rhythm:        Vascular    Pain in calf, thigh, or hip brought on by ambulation:    Pain in feet at night that wakes you up from your sleep:     Blood clot in your veins:    Leg swelling:         Pulmonary    Oxygen at home:    Productive cough:     Wheezing:         Neurologic    Sudden weakness in arms or legs:     Sudden numbness in  arms or legs:  Sudden onset of difficulty speaking or slurred speech:    Temporary loss of vision in one eye:     Problems with dizziness:         Gastrointestinal    Blood in stool:     Vomited blood:         Genitourinary    Burning when urinating:     Blood in urine:        Psychiatric    Major depression:         Hematologic    Bleeding problems:    Problems with blood clotting too easily:        Skin    Rashes or ulcers:        Constitutional    Fever or chills:    PHYSICAL EXAM:     Vitals:   03/27/19 1447  BP: 135/74  Pulse: 75  Resp: 16  Temp: 97.9 F (36.6 C)  TempSrc: Temporal  SpO2: 100%  Weight: (!) 305 lb (138.3 kg)  Height: 6' (1.829 m)   GENERAL: The patient is a well-nourished male, in no acute distress. The vital signs are documented above.  CARDIAC: There is a regular rate and rhythm.  VASCULAR:  2+ palpable radial pulse bilateral upper extremity  2+ palpable brachial pulse bilateral upper extremity  PULMONARY: There is good air exchange bilaterally without wheezing or rales.  ABDOMEN: Soft and non-tender with normal pitched bowel sounds.  MUSCULOSKELETAL: There are no major deformities or cyanosis.  NEUROLOGIC: No focal weakness or paresthesias are detected.  SKIN: There are no ulcers or rashes noted.  PSYCHIATRIC: The patient has a normal affect.  DATA:  Arterial duplex shows triphasic waveforms in bilateral upper extremities.  Vein mapping shows nice cephalic veins in the bilateral upper extremities   Assessment/Plan:  68 year old male with multiple medical problems as documented above that presents with stage V chronic kidney disease and evaluation for new dialysis access. Patient is right-handed and discussed we would normally opt for a left arm AV fistula. However, he has a fair amount of swelling in the left arm at baseline and has a chronic indwelling chest wall implant with an ICD. I suspect he probably has central venous stenosis  or occlusion on the left. As a result I have recommended right arm AV fistula placement. He does have a very nice cephalic vein all the way down to the distal forearm and could evaluate for radiocephalic versus brachiocephalic in the OR with ultrasound. Risk and benefits were discussed with him and his wife in detail. Discussed risk of steal, failure to mature, bleeding, infection, etc.   Marty Heck, MD [facility] Office: [phone] Pager: [place]

## 2019-05-25 NOTE — Transfer of Care (Signed)
Immediate Anesthesia Transfer of Care Note  Patient: Matthew Wood.  Procedure(s) Performed: ARTERIOVENOUS (AV) FISTULA CREATION RIGHT ARM (Right Arm Lower)  Patient Location: PACU  Anesthesia Type:MAC  Level of Consciousness: awake and patient cooperative  Airway & Oxygen Therapy: Patient Spontanous Breathing  Post-op Assessment: Report given to RN, Post -op Vital signs reviewed and stable and Patient moving all extremities  Post vital signs: Reviewed and stable  Last Vitals:  Vitals Value Taken Time  BP    Temp    Pulse 64 05/25/19 0910  Resp 11 05/25/19 0910  SpO2 100 % 05/25/19 0910  Vitals shown include unvalidated device data.  Last Pain: There were no vitals filed for this visit.       Complications: No apparent anesthesia complications

## 2019-05-25 NOTE — Discharge Instructions (Signed)
° °  [facility] ° °Discharge Instructions ° °AV Fistula or Graft Surgery for Dialysis Access ° °Please refer to the following instructions for your post-procedure care. Your surgeon or physician assistant will discuss any changes with you. ° °Activity ° °You may drive the day following your surgery, if you are comfortable and no longer taking prescription pain medication. Resume full activity as the soreness in your incision resolves. ° °Bathing/Showering ° °You may shower after you go home. Keep your incision dry for 48 hours. Do not soak in a bathtub, hot tub, or swim until the incision heals completely. You may not shower if you have a hemodialysis catheter. ° °Incision Care ° °Clean your incision with mild soap and water after 48 hours. Pat the area dry with a clean towel. You do not need a bandage unless otherwise instructed. Do not apply any ointments or creams to your incision. You may have skin glue on your incision. Do not peel it off. It will come off on its own in about one week. Your arm may swell a bit after surgery. To reduce swelling use pillows to elevate your arm so it is above your heart. Your doctor will tell you if you need to lightly wrap your arm with an ACE bandage. ° °Diet ° °Resume your normal diet. There are not special food restrictions following this procedure. In order to heal from your surgery, it is CRITICAL to get adequate nutrition. Your body requires vitamins, minerals, and protein. Vegetables are the best source of vitamins and minerals. Vegetables also provide the perfect balance of protein. Processed food has little nutritional value, so try to avoid this. ° °Medications ° °Resume taking all of your medications. If your incision is causing pain, you may take over-the counter pain relievers such as acetaminophen (Tylenol). If you were prescribed a stronger pain medication, please be aware these medications can cause nausea and constipation. Prevent  nausea by taking the medication with a snack or meal. Avoid constipation by drinking plenty of fluids and eating foods with high amount of fiber, such as fruits, vegetables, and grains. Do not take Tylenol if you are taking prescription pain medications. ° ° ° ° °Follow up °Your surgeon may want to see you in the office following your access surgery. If so, this will be arranged at the time of your surgery. ° °Please call us immediately for any of the following conditions: ° °Increased pain, redness, drainage (pus) from your incision site °Fever of 101 degrees or higher °Severe or worsening pain at your incision site °Hand pain or numbness. ° °Reduce your risk of vascular disease: ° °Stop smoking. If you would like help, call [facility] at 1-800-QUIT-NOW (1-800-784-8669) or [place] at 336-586-4000 ° °Manage your cholesterol °Maintain a desired weight °Control your diabetes °Keep your blood pressure down ° °Dialysis ° °It will take several weeks to several months for your new dialysis access to be ready for use. Your surgeon will determine when it is OK to use it. Your nephrologist will continue to direct your dialysis. You can continue to use your Permcath until your new access is ready for use. ° °If you have any questions, please call the office at [phone]. ° °

## 2019-05-25 NOTE — Anesthesia Procedure Notes (Signed)
Procedure Name: MAC Performed by: Davanna He R, CRNA Pre-anesthesia Checklist: Patient identified, Emergency Drugs available, Suction available, Patient being monitored and Timeout performed Patient Re-evaluated:Patient Re-evaluated prior to induction Oxygen Delivery Method: Nasal cannula Placement Confirmation: positive ETCO2 Dental Injury: Teeth and Oropharynx as per pre-operative assessment        

## 2019-05-25 NOTE — Anesthesia Postprocedure Evaluation (Signed)
Anesthesia Post Note  Patient: Matthew Wood.  Procedure(s) Performed: ARTERIOVENOUS (AV) FISTULA CREATION RIGHT ARM (Right Arm Lower)     Patient location during evaluation: PACU Anesthesia Type: MAC Level of consciousness: awake and alert Pain management: pain level controlled Vital Signs Assessment: post-procedure vital signs reviewed and stable Respiratory status: spontaneous breathing, nonlabored ventilation and respiratory function stable Cardiovascular status: stable and blood pressure returned to baseline Anesthetic complications: no    Last Vitals:  Vitals:   05/25/19 0925 05/25/19 0930  BP: (!) 104/56 118/64  Pulse: (!) 59 61  Resp: 11 11  Temp:  36.4 C  SpO2: 99% 99%    Last Pain:  Vitals:   05/25/19 0930  PainSc: 0-No pain                 Audry Pili

## 2019-05-25 NOTE — Op Note (Signed)
OPERATIVE NOTE   PROCEDURE: right radiocephaic arteriovenous fistula placement  PRE-OPERATIVE DIAGNOSIS: Stage V CKD  POST-OPERATIVE DIAGNOSIS: Same  SURGEON: Marty Heck, MD  ASSISTANT(S): Arlee Muslim, PA  ANESTHESIA: MAC  ESTIMATED BLOOD LOSS: Minimal  FINDING(S): 1.  Cephalic vein: 3 mm, acceptable 2.  Radial artery: 3 mm, disease free 3.  Venous outflow: palpable thrill  4.  Radial flow: dopplerable radial signal  SPECIMEN(S):  none   INDICATIONS:   Matthew Wood. is a 68 y.o. male who presents with stage V CKD and need for permanent hemodialysis access.  The patient is scheduled for right radiocephalic arteriovenous fistula placement.  The patient is aware the risks include but are not limited to: bleeding, infection, steal syndrome, nerve damage, ischemic monomelic neuropathy, failure to mature, and need for additional procedures.  The patient is aware of the risks of the procedure and elects to proceed forward.  DESCRIPTION: After full informed written consent was obtained from the patient, the patient was brought back to the operating room and placed supine upon the operating table.  Prior to induction, the patient received IV antibiotics.   After obtaining adequate anesthesia, the patient was then prepped and draped in the standard fashion for a right arm access procedure.   I turned my attention first to identifying the patient's distal cephalic vein and radial artery.  Using SonoSite guidance, the location of these vessels were marked out on the skin.   At this point, I injected local anesthetic to obtain a field block of the wrist.  In total, I injected about 5 mL of 1% lidocaine without epinephrine.  I made a longitudinal incision at the level of the wrist and dissected through the subcutaneous tissue and fascia to gain exposure of the radial artery.  This was noted to be 3 mm in diameter externally.  This was dissected out proximally and distally and  controlled with vessel loops .  I then dissected out the cephalic vein.  This was noted to be 3 mm in diameter externally.  The distal segment of the vein was ligated with a  2-0 silk, and the vein was transected.  The proximal segment was interrogated with serial dilators.  The vein accepted up to a 4 mm dilator without any difficulty.  I then instilled the heparinized saline into the vein and clamped it.  The patient was given 3000 units IV heparin.  At this point, I reset my exposure of the radial artery and placed the artery under tension proximally and distally.  I made an arteriotomy with a #11 blade, and then I extended the arteriotomy with a Potts scissor.  I injected heparinized saline proximal and distal to this arteriotomy.  The vein was then sewn to the artery in an end-to-side configuration with a running stitch of 6-0 Prolene.  Prior to completing this anastomosis, I allowed the vein and artery to backbleed.  There was no evidence of clot from any vessels.  I completed the anastomosis in the usual fashion and then released all vessel loops and clamps.    There was a palpable thrill in the venous outflow, and there was a dopplerable radial signal.  At this point, I irrigated out the surgical wound.  There was no further active bleeding.  The subcutaneous tissue was reapproximated with a running stitch of 3-0 Vicryl.  The skin was then reapproximated with a running subcuticular stitch of 4-0 Monocryl.  The skin was then cleaned, dried, and reinforced  with Dermabond.  The patient tolerated this procedure well.   COMPLICATIONS: None  CONDITION: Stable   [name], MD [facility] [place] Office: [phone] Pager: [phone]  05/25/2019, 9:11 AM

## 2019-05-29 ENCOUNTER — Telehealth: Payer: Self-pay | Admitting: Interventional Cardiology

## 2019-05-29 ENCOUNTER — Other Ambulatory Visit: Payer: Self-pay

## 2019-05-29 ENCOUNTER — Encounter (HOSPITAL_COMMUNITY)
Admission: RE | Admit: 2019-05-29 | Discharge: 2019-05-29 | Disposition: A | Discharge status: Home / Self Care | Payer: Medicare Other | Source: Ambulatory Visit | Attending: Nephrology | Admitting: Nephrology

## 2019-05-29 VITALS — BP 115/64 | HR 78 | Temp 97.1°F | Resp 18

## 2019-05-29 DIAGNOSIS — N184 Chronic kidney disease, stage 4 (severe): Secondary | ICD-10-CM | POA: Insufficient documentation

## 2019-05-29 LAB — POCT HEMOGLOBIN-HEMACUE: Hemoglobin: 10.5 g/dL — ABNORMAL LOW (ref 13.0–17.0)

## 2019-05-29 MED ORDER — DARBEPOETIN ALFA 40 MCG/0.4ML IJ SOSY
PREFILLED_SYRINGE | INTRAMUSCULAR | Status: AC
  Filled 2019-05-29: qty 0.4

## 2019-05-29 MED ORDER — DARBEPOETIN ALFA 40 MCG/0.4ML IJ SOSY
40.0000 ug | PREFILLED_SYRINGE | INTRAMUSCULAR | Status: DC
  Administered 2019-05-29: 40 ug via SUBCUTANEOUS

## 2019-05-29 NOTE — Telephone Encounter (Signed)
  STAT if patient feels like he/she is going to faint   1) Are you dizzy now? no  2) Do you feel faint or have you passed out? no  3) Do you have any other symptoms?  He has no appetite, and has been nauseas and has thrown up. He also has had loose bowels. He is also weak and shaky.  4) Have you checked your HR and BP (record if available)? no  Patient's wife calling stating that the patient had a port put in this arm for dialysis on 05/25/19. She is concerned his symptoms may be related to this.

## 2019-05-29 NOTE — Telephone Encounter (Signed)
Spoke with wife, DPR on file.  Pt had fistula placed 12/11 and since then has had loose BMs, no appetite, fatigue and shaky.  Advised wife to reach out to PCP and Nephrology about sxs.  Wife verbalized understanding.

## 2019-05-30 ENCOUNTER — Telehealth: Payer: Self-pay

## 2019-05-30 NOTE — Telephone Encounter (Signed)
Charmaine - pts daughter called and said that pt was not feeling well. He is lethargic, dizzy, nauseated and just not feeling himself. Advised her that these could be effects from anesthesia. Advised her to push fluids on him as he may also be dehydrated given she said that he has not been eating and drinking well.   She said that his BP was pretty low, 90/50s and one reading 87/42. Suggested she take him to the ER for evaluation given his symptoms and that he is not feeling well at all. She said that he is refusing to go but she thinks that she will call the paramedics to have him evaluated.   York Cerise, CMA

## 2019-06-06 ENCOUNTER — Ambulatory Visit (INDEPENDENT_AMBULATORY_CARE_PROVIDER_SITE_OTHER): Payer: Medicare Other | Admitting: *Deleted

## 2019-06-06 DIAGNOSIS — I5022 Chronic systolic (congestive) heart failure: Secondary | ICD-10-CM | POA: Diagnosis not present

## 2019-06-07 LAB — CUP PACEART REMOTE DEVICE CHECK
Battery Remaining Longevity: 45 mo
Battery Voltage: 2.96 V
Brady Statistic AP VP Percent: 65.65 %
Brady Statistic AP VS Percent: 0.25 %
Brady Statistic AS VP Percent: 33.33 %
Brady Statistic AS VS Percent: 0.78 %
Brady Statistic RA Percent Paced: 64.35 %
Brady Statistic RV Percent Paced: 94.55 %
Date Time Interrogation Session: 20201223033325
HighPow Impedance: 58 Ohm
Implantable Lead Implant Date: 20171212
Implantable Lead Implant Date: 20171212
Implantable Lead Implant Date: 20171212
Implantable Lead Location: 753858
Implantable Lead Location: 753859
Implantable Lead Location: 753860
Implantable Lead Model: 4298
Implantable Lead Model: 5076
Implantable Pulse Generator Implant Date: 20171212
Lead Channel Impedance Value: 114 Ohm
Lead Channel Impedance Value: 118.56 Ohm
Lead Channel Impedance Value: 126.667
Lead Channel Impedance Value: 126.667
Lead Channel Impedance Value: 132.321
Lead Channel Impedance Value: 228 Ohm
Lead Channel Impedance Value: 228 Ohm
Lead Channel Impedance Value: 247 Ohm
Lead Channel Impedance Value: 247 Ohm
Lead Channel Impedance Value: 285 Ohm
Lead Channel Impedance Value: 285 Ohm
Lead Channel Impedance Value: 342 Ohm
Lead Channel Impedance Value: 361 Ohm
Lead Channel Impedance Value: 361 Ohm
Lead Channel Impedance Value: 418 Ohm
Lead Channel Impedance Value: 418 Ohm
Lead Channel Impedance Value: 418 Ohm
Lead Channel Impedance Value: 418 Ohm
Lead Channel Pacing Threshold Amplitude: 0.625 V
Lead Channel Pacing Threshold Amplitude: 0.875 V
Lead Channel Pacing Threshold Amplitude: 0.875 V
Lead Channel Pacing Threshold Pulse Width: 0.4 ms
Lead Channel Pacing Threshold Pulse Width: 0.4 ms
Lead Channel Pacing Threshold Pulse Width: 0.4 ms
Lead Channel Sensing Intrinsic Amplitude: 15.625 mV
Lead Channel Sensing Intrinsic Amplitude: 15.625 mV
Lead Channel Sensing Intrinsic Amplitude: 3.75 mV
Lead Channel Sensing Intrinsic Amplitude: 3.75 mV
Lead Channel Setting Pacing Amplitude: 1.5 V
Lead Channel Setting Pacing Amplitude: 2 V
Lead Channel Setting Pacing Amplitude: 2.5 V
Lead Channel Setting Pacing Pulse Width: 0.4 ms
Lead Channel Setting Pacing Pulse Width: 0.4 ms
Lead Channel Setting Sensing Sensitivity: 0.3 mV

## 2019-06-12 ENCOUNTER — Encounter (HOSPITAL_COMMUNITY): Payer: Medicare Other

## 2019-06-18 ENCOUNTER — Ambulatory Visit (INDEPENDENT_AMBULATORY_CARE_PROVIDER_SITE_OTHER): Payer: Medicare Other | Admitting: Cardiology

## 2019-06-18 ENCOUNTER — Encounter: Payer: Self-pay | Admitting: Cardiology

## 2019-06-18 ENCOUNTER — Other Ambulatory Visit: Payer: Self-pay

## 2019-06-18 VITALS — BP 124/66 | HR 99 | Ht 72.0 in | Wt 298.1 lb

## 2019-06-18 DIAGNOSIS — I5022 Chronic systolic (congestive) heart failure: Secondary | ICD-10-CM | POA: Diagnosis not present

## 2019-06-18 NOTE — Patient Instructions (Signed)
Medication Instructions:  Your physician recommends that you continue on your current medications as directed. Please refer to the Current Medication list given to you today.  *If you need a refill on your cardiac medications before your next appointment, please call your pharmacy*  Labwork: None ordered  Testing/Procedures: Your physician has recommended that you have a sleep study. This test records several body functions during sleep, including: brain activity, eye movement, oxygen and carbon dioxide blood levels, heart rate and rhythm, breathing rate and rhythm, the flow of air through your mouth and nose, snoring, body muscle movements, and chest and belly movement.  The office will contact you to arrange this after pre-certifying with insurance first.  Follow-Up: Remote monitoring is used to monitor your Pacemaker or ICD from home. This monitoring reduces the number of office visits required to check your device to one time per year. It allows [place] to keep an eye on the functioning of your device to ensure it is working properly. You are scheduled for a device check from home on 09/05/2019. You may send your transmission at any time that day. If you have a wireless device, the transmission will be sent automatically. After your physician reviews your transmission, you will receive a postcard with your next transmission date.  Your physician wants you to follow-up in: 1 year with Dr. Curt Bears. You will receive a reminder letter in the mail two months in advance. If you don't receive a letter, please call our office to schedule the follow-up appointment.    Thank you for choosing [facility]!!   Trinidad Curet, RN [phone]  Any Other Special Instructions Will Be Listed Below (If Applicable).

## 2019-06-18 NOTE — Progress Notes (Signed)
Electrophysiology Office Note   Date:  06/19/2019   ID:  Matthew Wood., DOB [birth year], MRN [id number]  PCP:  Seward Carol, MD  Cardiologist:  Tamala Julian Primary Electrophysiologist:  Tashica Provencio Meredith Leeds, MD    No chief complaint on file.    History of Present Illness: Matthew Wood. is a 69 y.o. male who presents today for electrophysiology evaluation.   Hx CAD, chronic combined systolic and diastolic heart failure, HTN, HLD, LBBB, multinodular goiter, OSA on CPAP, DM, CVA and CKD stage III-IV.   LHC 04/27/10 for decreased LV function and recent stroke: left main patent, 30-40% LAD, mid circumflex 30%, 80% mid RCA accounting for the appearance of an infract/peri-infract ischemia on nuclear testing, LV EF of 30-45%--> medical therapy recommended to avoid further contrast use that may impair renal function.  Had Medtronic CRT-D implant 06/03/16.    Today, denies symptoms of palpitations, chest pain, shortness of breath, orthopnea, PND, lower extremity edema, claudication, dizziness, presyncope, syncope, bleeding, or neurologic sequela. The patient is tolerating medications without difficulties.  He is overall feeling well.  Unfortunately he now has a dialysis fistula in place.  He Matthew Wood likely start dialysis unit this spring or summer.   Past Medical History:  Diagnosis Date  . Anemia   . CHF (congestive heart failure) ([place])   . Coronary artery disease    a. LHC 04/27/10 left main patent, 30-40% LAD, mid circumflex 30%, 80% mid RCA accounting for the appearance of an infract/peri-infract ischemia on nuclear testing, LV EF of 30-45%--> medical therapy  . CVA (cerebrovascular accident) ([place]) 2011   potine; "little balance problems since" (05/25/2016)  . Dyspnea   . Gout   . History of kidney stones    "passed them" (05/25/2016)  . Hyperlipidemia   . Hypertension   . LBBB (left bundle branch block)   . Multinodular goiter   . Obesity   . OSA on CPAP   . Presence of  permanent cardiac pacemaker   . Renal insufficiency   . Type II diabetes mellitus ([place])    Past Surgical History:  Procedure Laterality Date  . AV FISTULA PLACEMENT Right 05/25/2019   Procedure: ARTERIOVENOUS (AV) FISTULA CREATION RIGHT ARM;  Surgeon: Marty Heck, MD;  Location: [facility];  Service: Vascular;  Laterality: Right;  . COLONOSCOPY WITH PROPOFOL N/A 04/16/2014   Procedure: COLONOSCOPY WITH PROPOFOL;  Surgeon: [name], MD;  Location: [facility];  Service: Endoscopy;  Laterality: N/A;  . EP IMPLANTABLE DEVICE N/A 05/25/2016   Procedure: BiV ICD Insertion CRT-D;  Surgeon: Umaima Scholten Meredith Leeds, MD;  Location: [facility];  Service: Cardiovascular;  Laterality: N/A;  . INSERT / REPLACE / REMOVE PACEMAKER  05/25/2016   biventricular pacemaker  . LAPAROSCOPIC CHOLECYSTECTOMY       Current Outpatient Medications  Medication Sig Dispense Refill  . allopurinol (ZYLOPRIM) 300 MG tablet Take 300 mg by mouth daily.    Marland Kitchen amLODipine-benazepril (LOTREL) 10-40 MG per capsule Take 1 capsule by mouth every morning.     Marland Kitchen aspirin EC 81 MG tablet Take 81 mg by mouth at bedtime.    . carvedilol (COREG) 25 MG tablet Take 25 mg by mouth 2 (two) times daily with a meal.    . cloNIDine (CATAPRES) 0.2 MG tablet Take 0.1-0.2 mg by mouth See admin instructions. Take 0.1 mg by mouth in the morning and 0.2 mg in the evening    . furosemide (LASIX) 80 MG tablet Take 80  mg by mouth daily.    Marland Kitchen HUMALOG KWIKPEN 100 UNIT/ML KiwkPen Inject 14 Units into the skin daily after breakfast.   5  . HYDROcodone-acetaminophen (NORCO) 5-325 MG tablet Take 1 tablet by mouth every 6 (six) hours as needed for moderate pain. 10 tablet 0  . insulin glargine (LANTUS) 100 UNIT/ML injection Inject 40 Units into the skin at bedtime.    . isosorbide-hydrALAZINE (BIDIL) 20-37.5 MG tablet Take 1 tablet by mouth 2 (two) times daily. 60 tablet 4  . KLOR-CON M10 10 MEQ tablet TAKE 1 TABLET (10 MEQ TOTAL) BY MOUTH 2  (TWO) TIMES DAILY. (Patient taking differently: Take 10 mEq by mouth 2 (two) times daily. ) 180 tablet 3  . metolazone (ZAROXOLYN) 2.5 MG tablet Take 1 tablet by mouth once weekly if you gain 3-4 pounds in a day (Patient taking differently: Take 2.5 mg by mouth once a week. Take 1 tablet by mouth once weekly if you gain 3-4 pounds in a day) 15 tablet 2  . NON FORMULARY CPAP: At bedtime    . rosuvastatin (CRESTOR) 20 MG tablet Take 20 mg by mouth daily.     No current facility-administered medications for this visit.    Allergies:   Patient has no known allergies.   Social History:  The patient  reports that he quit smoking about 9 years ago. His smoking use included cigarettes. He has a 13.50 pack-year smoking history. He has never used smokeless tobacco. He reports previous alcohol use. He reports that he does not use drugs.   Family History:  The patient's family history includes Breast cancer in his mother; Diabetes type II in his father; Heart attack in his father; Hypertension in his mother and sister; Stroke in his father.   ROS:  Please see the history of present illness.   Otherwise, review of systems is positive for none.   All other systems are reviewed and negative.   PHYSICAL EXAM: VS:  BP 124/66   Pulse 99   Ht 6' (1.829 m)   Wt 298 lb 1.9 oz (135.2 kg)   SpO2 98%   BMI 40.43 kg/m  , BMI Body mass index is 40.43 kg/m. GEN: Well nourished, well developed, in no acute distress  HEENT: normal  Neck: no JVD, carotid bruits, or masses Cardiac: RRR; no murmurs, rubs, or gallops,no edema  Respiratory:  clear to auscultation bilaterally, normal work of breathing GI: soft, nontender, nondistended, + BS MS: no deformity or atrophy  Skin: warm and dry, device site well healed Neuro:  Strength and sensation are intact Psych: euthymic mood, full affect  EKG:  EKG is not ordered today. Personal review of the ekg ordered shows AV paced  Personal review of the device interrogation  today. Results in [facility]: 04/17/2019: ALT 15 05/15/2019: Platelets 165 05/25/2019: BUN >140; Creatinine, Ser 6.30; Potassium 3.4; Sodium 139 05/29/2019: Hemoglobin 10.5    Lipid Panel     Component Value Date/Time   CHOL  02/08/2010 0600    171        ATP III CLASSIFICATION:  <200     mg/dL   Desirable  200-239  mg/dL   Borderline High  >=240    mg/dL   High          TRIG 248 (H) 02/08/2010 0600   HDL 34 (L) 02/08/2010 0600   CHOLHDL 5.0 02/08/2010 0600   VLDL 50 (H) 02/08/2010 0600   LDLCALC  02/08/2010 0600  87        Total Cholesterol/HDL:CHD Risk Coronary Heart Disease Risk Table                     Men   Women  1/2 Average Risk   3.4   3.3  Average Risk       5.0   4.4  2 X Average Risk   9.6   7.1  3 X Average Risk  23.4   11.0        Use the calculated Patient Ratio above and the CHD Risk Table to determine the patient's CHD Risk.        ATP III CLASSIFICATION (LDL):  <100     mg/dL   Optimal  100-129  mg/dL   Near or Above                    Optimal  130-159  mg/dL   Borderline  160-189  mg/dL   High  >190     mg/dL   Very High     Wt Readings from Last 3 Encounters:  06/18/19 298 lb 1.9 oz (135.2 kg)  05/15/19 300 lb (136.1 kg)  03/27/19 (!) 305 lb (138.3 kg)      Other studies Reviewed: Additional studies/ records that were reviewed today include: TTE 02/19/16  Review of the above records today demonstrates:  - Left ventricle: The cavity size was moderately dilated. Wall   thickness was increased in a pattern of mild LVH. Systolic   function was moderately to severely reduced. The estimated   ejection fraction was in the range of 30% to 35%. Diffuse   hypokinesis. Features are consistent with a pseudonormal left   ventricular filling pattern, with concomitant abnormal relaxation   and increased filling pressure (grade 2 diastolic dysfunction). - Aortic valve: There was no stenosis. There was mild   regurgitation. - Aorta: Mildly  dilated aortic root and ascending aorta. Aortic   root dimension: 39 mm (ED). Ascending aortic diameter: 42 mm (S). - Mitral valve: There was mild regurgitation. - Left atrium: The atrium was moderately dilated. - Right ventricle: The cavity size was normal. Systolic function   was normal. - Right atrium: The atrium was mildly dilated. - Pulmonary arteries: No complete TR doppler jet so unable to   estimate PA systolic pressure. - Systemic veins: IVC not fully visualized. - Pericardium, extracardiac: A trivial pericardial effusion was   identified posterior to the heart.   ASSESSMENT AND PLAN:  Chronic combined systolic and diastolic CHF: Ejection fraction 30 to 35% status post Medtronic CRT-D implanted 06/03/2016.  Device functioning appropriately.  No changes at this time.    Coronary artery disease: No current chest pain.  Left heart catheterization with significant disease.  Medically treated.  Essential hypertension:  Well controlled  Hyperlipidemia: Continue statin  Sleep apnea:CPAP compliance encouraged  Diabetes mellitus Type II: Followed by primary care.  Chronic kidney disease, stage III-IV:  Followed by nephrology   Current medicines are reviewed at length with the patient today.   The patient does not have concerns regarding his medicines.  The following changes were made today:  none  Labs/ tests ordered today include:  No orders of the defined types were placed in this encounter.    Disposition:   FU with Lanijah Warzecha 12 months  Signed, Alyze Lauf Meredith Leeds, MD  06/19/2019 7:34 AM     [address]  [place] [id number] [phone] (office) [phone] (fax)

## 2019-06-26 ENCOUNTER — Encounter (HOSPITAL_COMMUNITY)
Admission: RE | Admit: 2019-06-26 | Discharge: 2019-06-26 | Disposition: A | Discharge status: Home / Self Care | Payer: Medicare Other | Source: Ambulatory Visit | Attending: Nephrology | Admitting: Nephrology

## 2019-06-26 ENCOUNTER — Other Ambulatory Visit: Payer: Self-pay

## 2019-06-26 VITALS — BP 105/61 | HR 73 | Temp 97.3°F | Resp 18

## 2019-06-26 DIAGNOSIS — N184 Chronic kidney disease, stage 4 (severe): Secondary | ICD-10-CM | POA: Diagnosis not present

## 2019-06-26 LAB — CBC WITH DIFFERENTIAL/PLATELET
Abs Immature Granulocytes: 0.02 10*3/uL (ref 0.00–0.07)
Basophils Absolute: 0 10*3/uL (ref 0.0–0.1)
Basophils Relative: 0 %
Eosinophils Absolute: 0.1 10*3/uL (ref 0.0–0.5)
Eosinophils Relative: 2 %
HCT: 32 % — ABNORMAL LOW (ref 39.0–52.0)
Hemoglobin: 9.7 g/dL — ABNORMAL LOW (ref 13.0–17.0)
Immature Granulocytes: 0 %
Lymphocytes Relative: 33 %
Lymphs Abs: 2.1 10*3/uL (ref 0.7–4.0)
MCH: 27.3 pg (ref 26.0–34.0)
MCHC: 30.3 g/dL (ref 30.0–36.0)
MCV: 90.1 fL (ref 80.0–100.0)
Monocytes Absolute: 0.8 10*3/uL (ref 0.1–1.0)
Monocytes Relative: 12 %
Neutro Abs: 3.3 10*3/uL (ref 1.7–7.7)
Neutrophils Relative %: 53 %
Platelets: 152 10*3/uL (ref 150–400)
RBC: 3.55 MIL/uL — ABNORMAL LOW (ref 4.22–5.81)
RDW: 16.5 % — ABNORMAL HIGH (ref 11.5–15.5)
WBC: 6.3 10*3/uL (ref 4.0–10.5)
nRBC: 0 % (ref 0.0–0.2)

## 2019-06-26 LAB — COMPREHENSIVE METABOLIC PANEL
ALT: 26 U/L (ref 0–44)
AST: 24 U/L (ref 15–41)
Albumin: 3.9 g/dL (ref 3.5–5.0)
Alkaline Phosphatase: 70 U/L (ref 38–126)
Anion gap: 12 (ref 5–15)
BUN: 114 mg/dL — ABNORMAL HIGH (ref 8–23)
CO2: 20 mmol/L — ABNORMAL LOW (ref 22–32)
Calcium: 9.8 mg/dL (ref 8.9–10.3)
Chloride: 107 mmol/L (ref 98–111)
Creatinine, Ser: 5.13 mg/dL — ABNORMAL HIGH (ref 0.61–1.24)
GFR calc Af Amer: 13 mL/min — ABNORMAL LOW (ref >60)
GFR calc non Af Amer: 11 mL/min — ABNORMAL LOW (ref >60)
Glucose, Bld: 93 mg/dL (ref 70–99)
Potassium: 4 mmol/L (ref 3.5–5.1)
Sodium: 139 mmol/L (ref 135–145)
Total Bilirubin: 0.6 mg/dL (ref 0.3–1.2)
Total Protein: 7.4 g/dL (ref 6.5–8.1)

## 2019-06-26 LAB — IRON AND TIBC
Iron: 46 ug/dL (ref 45–182)
Saturation Ratios: 20 % (ref 17.9–39.5)
TIBC: 232 ug/dL — ABNORMAL LOW (ref 250–450)
UIBC: 186 ug/dL

## 2019-06-26 LAB — POCT HEMOGLOBIN-HEMACUE: Hemoglobin: 9.5 g/dL — ABNORMAL LOW (ref 13.0–17.0)

## 2019-06-26 LAB — FERRITIN: Ferritin: 594 ng/mL — ABNORMAL HIGH (ref 24–336)

## 2019-06-26 LAB — PHOSPHORUS: Phosphorus: 5 mg/dL — ABNORMAL HIGH (ref 2.5–4.6)

## 2019-06-26 MED ORDER — DARBEPOETIN ALFA 40 MCG/0.4ML IJ SOSY
40.0000 ug | PREFILLED_SYRINGE | INTRAMUSCULAR | Status: DC
  Administered 2019-06-26: 40 ug via SUBCUTANEOUS

## 2019-06-26 MED ORDER — DARBEPOETIN ALFA 40 MCG/0.4ML IJ SOSY
PREFILLED_SYRINGE | INTRAMUSCULAR | Status: AC
  Filled 2019-06-26: qty 0.4

## 2019-07-10 ENCOUNTER — Ambulatory Visit (HOSPITAL_COMMUNITY)
Admission: RE | Admit: 2019-07-10 | Discharge: 2019-07-10 | Disposition: A | Discharge status: Home / Self Care | Payer: Medicare Other | Source: Ambulatory Visit | Attending: Vascular Surgery | Admitting: Vascular Surgery

## 2019-07-10 ENCOUNTER — Other Ambulatory Visit: Payer: Self-pay

## 2019-07-10 ENCOUNTER — Ambulatory Visit (INDEPENDENT_AMBULATORY_CARE_PROVIDER_SITE_OTHER): Payer: Self-pay | Admitting: Physician Assistant

## 2019-07-10 VITALS — BP 130/72 | HR 90 | Temp 96.7°F | Ht 72.0 in | Wt 293.0 lb

## 2019-07-10 DIAGNOSIS — N185 Chronic kidney disease, stage 5: Secondary | ICD-10-CM | POA: Diagnosis not present

## 2019-07-10 NOTE — Progress Notes (Signed)
POST OPERATIVE OFFICE NOTE    CC:  F/u for surgery  HPI:  This is a 69 y.o. male who is s/p right forearm radiocephalic AV fistula creation.  He is stage IV/V and not yet requiring HD.    He denise pain, loss of sensation and loss of motor in the right UE.  No Known Allergies  Current Outpatient Medications  Medication Sig Dispense Refill  . allopurinol (ZYLOPRIM) 300 MG tablet Take 300 mg by mouth daily.    Marland Kitchen amLODipine-benazepril (LOTREL) 10-40 MG per capsule Take 1 capsule by mouth every morning.     Marland Kitchen aspirin EC 81 MG tablet Take 81 mg by mouth at bedtime.    . carvedilol (COREG) 25 MG tablet Take 25 mg by mouth 2 (two) times daily with a meal.    . cloNIDine (CATAPRES) 0.2 MG tablet Take 0.1-0.2 mg by mouth See admin instructions. Take 0.1 mg by mouth in the morning and 0.2 mg in the evening    . furosemide (LASIX) 80 MG tablet Take 80 mg by mouth daily.    Marland Kitchen HUMALOG KWIKPEN 100 UNIT/ML KiwkPen Inject 14 Units into the skin daily after breakfast.   5  . HYDROcodone-acetaminophen (NORCO) 5-325 MG tablet Take 1 tablet by mouth every 6 (six) hours as needed for moderate pain. 10 tablet 0  . insulin glargine (LANTUS) 100 UNIT/ML injection Inject 40 Units into the skin at bedtime.    . isosorbide-hydrALAZINE (BIDIL) 20-37.5 MG tablet Take 1 tablet by mouth 2 (two) times daily. 60 tablet 4  . KLOR-CON M10 10 MEQ tablet TAKE 1 TABLET (10 MEQ TOTAL) BY MOUTH 2 (TWO) TIMES DAILY. (Patient taking differently: Take 10 mEq by mouth 2 (two) times daily. ) 180 tablet 3  . metolazone (ZAROXOLYN) 2.5 MG tablet Take 1 tablet by mouth once weekly if you gain 3-4 pounds in a day (Patient taking differently: Take 2.5 mg by mouth once a week. Take 1 tablet by mouth once weekly if you gain 3-4 pounds in a day) 15 tablet 2  . NON FORMULARY CPAP: At bedtime    . rosuvastatin (CRESTOR) 20 MG tablet Take 20 mg by mouth daily.     No current facility-administered medications for this visit.     ROS:  See  HPI  Physical Exam: +------------+----------+-------------+----------+-----------------------------+ OUTFLOW VEINPSV (cm/s)Diameter (cm)Depth (cm)          Describe            +------------+----------+-------------+----------+-----------------------------+ AC Fossa       157        0.52        0.29   Retained valve and competing                                                          branch             +------------+----------+-------------+----------+-----------------------------+ Prox Forearm   184        0.57        0.27                                 +------------+----------+-------------+----------+-----------------------------+ Mid Forearm    172        0.62  0.33                                 +------------+----------+-------------+----------+-----------------------------+ Dist Forearm   341        0.53        0.40                                 +------------+----------+-------------+----------+-----------------------------+       Summary: Patent arteriovenous fistula.    Incision:  Right forearm incision well healed with palpable thrill in fistula.  Palpable radial pulse right UE. Extremities:   Grip 5/5, sensation and motor intact and equal B UE Lungs: non labored breathing   Assessment/Plan:  This is a 69 y.o. male who is s/p: Right forearm radial Cephalic AV fistula creation   The fistula is palpable and the duplex demonstrates good depth.  I have instructed him to exercise his right hand and UE to help the fistula mature.    The fistula may be accessed as of March 12 th 2021 if he requires HD.  F/U PRN.   Roxy Horseman , PA-C [facility] [phone]  Clinic MD:  Early

## 2019-07-12 ENCOUNTER — Telehealth: Payer: Self-pay | Admitting: Cardiology

## 2019-07-12 ENCOUNTER — Telehealth: Payer: Self-pay | Admitting: *Deleted

## 2019-07-12 DIAGNOSIS — G4733 Obstructive sleep apnea (adult) (pediatric): Secondary | ICD-10-CM

## 2019-07-12 NOTE — Telephone Encounter (Signed)
Patient's wife is requesting a call back from Dr. Curt Bears nurse. Per her husbands last OV with Dr. Curt Bears on 06/18/19, patient's wife states that her husband was to be set up for a sleep study so he can get set up with a new cpap machine. She wanted to speak with the nurse because she was unsure if there was miscommunication because it had been a couple weeks and she has not heard from anyone.

## 2019-07-12 NOTE — Telephone Encounter (Signed)
Followed up w/ pt, as he answered phone. Informed that I sent a staff message last week, and this sometimes takes some time. Informed that I would forward to sleep study person to follow up with them and let them know where we are at in the process of this. Pt appreciate and looks forward to them returning call.

## 2019-07-12 NOTE — Telephone Encounter (Signed)
-----   Message from Lauralee Evener, [place] sent at 07/04/2019  4:54 PM EST ----- Regarding: RE: Sleep Study  ----- Message ----- From: Stanton Kidney, RN Sent: 07/04/2019   4:00 PM EST To: Stanton Kidney, RN, [facility] Sleep Studies Subject: Sleep Study                                    Pt has had sleep study in past, needs updated study and/or CPAP titration.  States mask he has does not seal/work, so he isn't wearing it. I did not place sleep study order in case you didn't need.  If needed, let me know and I will place order.  When calling to discuss/arrange - call wife per pt.  States she takes care of things.

## 2019-07-13 NOTE — Telephone Encounter (Signed)
Patient is scheduled for lab study on 07/26/19. Pt is scheduled for COVID screening on 07/23/19 2:30 prior to SS. Patient understands his sleep study will be done at [facility] sleep lab. Patient understands he will receive a sleep packet in a week or so. Patient understands to call if he does not receive the sleep packet in a timely manner. Patient agrees with treatment and thanked me for call.

## 2019-07-13 NOTE — Telephone Encounter (Signed)
Patient is scheduled for lab study on 07/26/19. Pt is scheduled for COVID screening on 07/23/19 2:30 prior to SS. Patient understands his sleep study will be done at [facility] sleep lab. Patient understands he will receive a sleep packet in a week or so. Patient understands to call if he does not receive the sleep packet in a timely manner. Patient agrees with treatment and thanked me for call.

## 2019-07-17 ENCOUNTER — Telehealth: Payer: Self-pay

## 2019-07-17 NOTE — Telephone Encounter (Signed)
Attempted to reach pt. To provide MD instruction as noted and office ICM clinic f/u.  No answer, left VM requesting pt callback.

## 2019-07-17 NOTE — Telephone Encounter (Signed)
CRT-D transmission received, noted to be scheduled transmission, however pt not due for scheduled transmission.   Optivol fluid index noted to be elevated with thoracic impedence below baseline.  Pt current medications include Furosemide 80mg  daily, Metolazone 2.5mg  weekly.   Spoke with pt and his spouse briefly as he was at an appt with Endocrinologist.  Spouse indicates that his remote monitor had been unplugged and she just plugged it back yesterday, which may explain why the transmission came through.  She reports pt SOB at baseline, he has not had increase or change in symptoms.  She confirmed that pt is taking Furosemide daily 80mg  but could not confirm if pt taking metolazone since they were not at home.    Next OV due is for Dr. Tamala Julian in March.  Routing to MD for review in interim due to increasing levels/ HF risk.

## 2019-07-17 NOTE — Telephone Encounter (Signed)
Needs to take metolazone twice per week for the next 2 weeks then go back to prior dose.

## 2019-07-17 NOTE — Telephone Encounter (Signed)
Pt returned phone call.  He states he has not taken Metolazone in at least 2 weeks because it makes him urinate frequently.    Educated pt on importance of medication compliance.  Pt reports he has not had increased SOB lately, only when the heat is high in the house.  Explained to pt that SOB is often a s/s of increased HF and he needs to be monitoring for these symptoms.    Advised of Dr. Curt Bears recommendation to take Metolazone twice a week for next 2 weeks, then go back to once weekly. Discussed ICM program, pt agreeable sending message to have pt enrolled.

## 2019-07-18 IMAGING — DX DG CHEST 1V PORT
2 series · 2 of 2 positions shown · non-contrast
Comparison: 05/26/2016

CLINICAL DATA: Cough and fevers

EXAM:
PORTABLE CHEST 1 VIEW

[chest ap (1 of 2)]
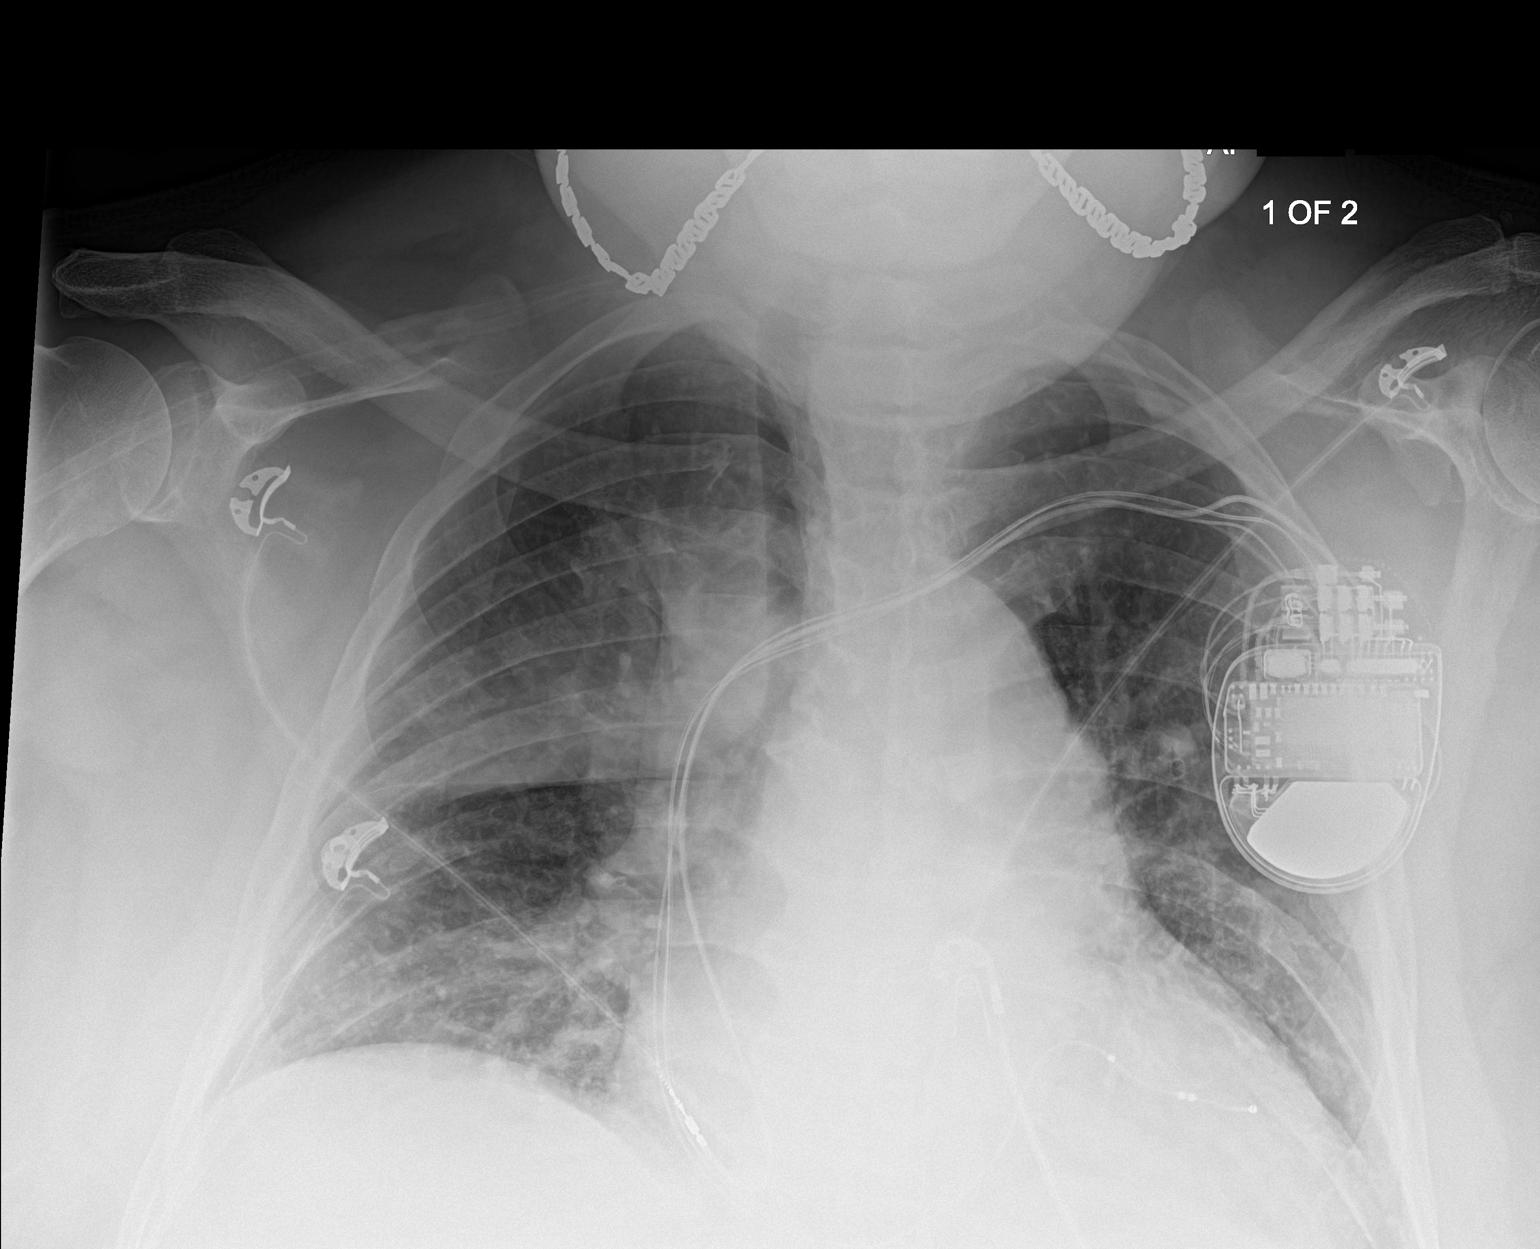

[chest ap (2 of 2)]
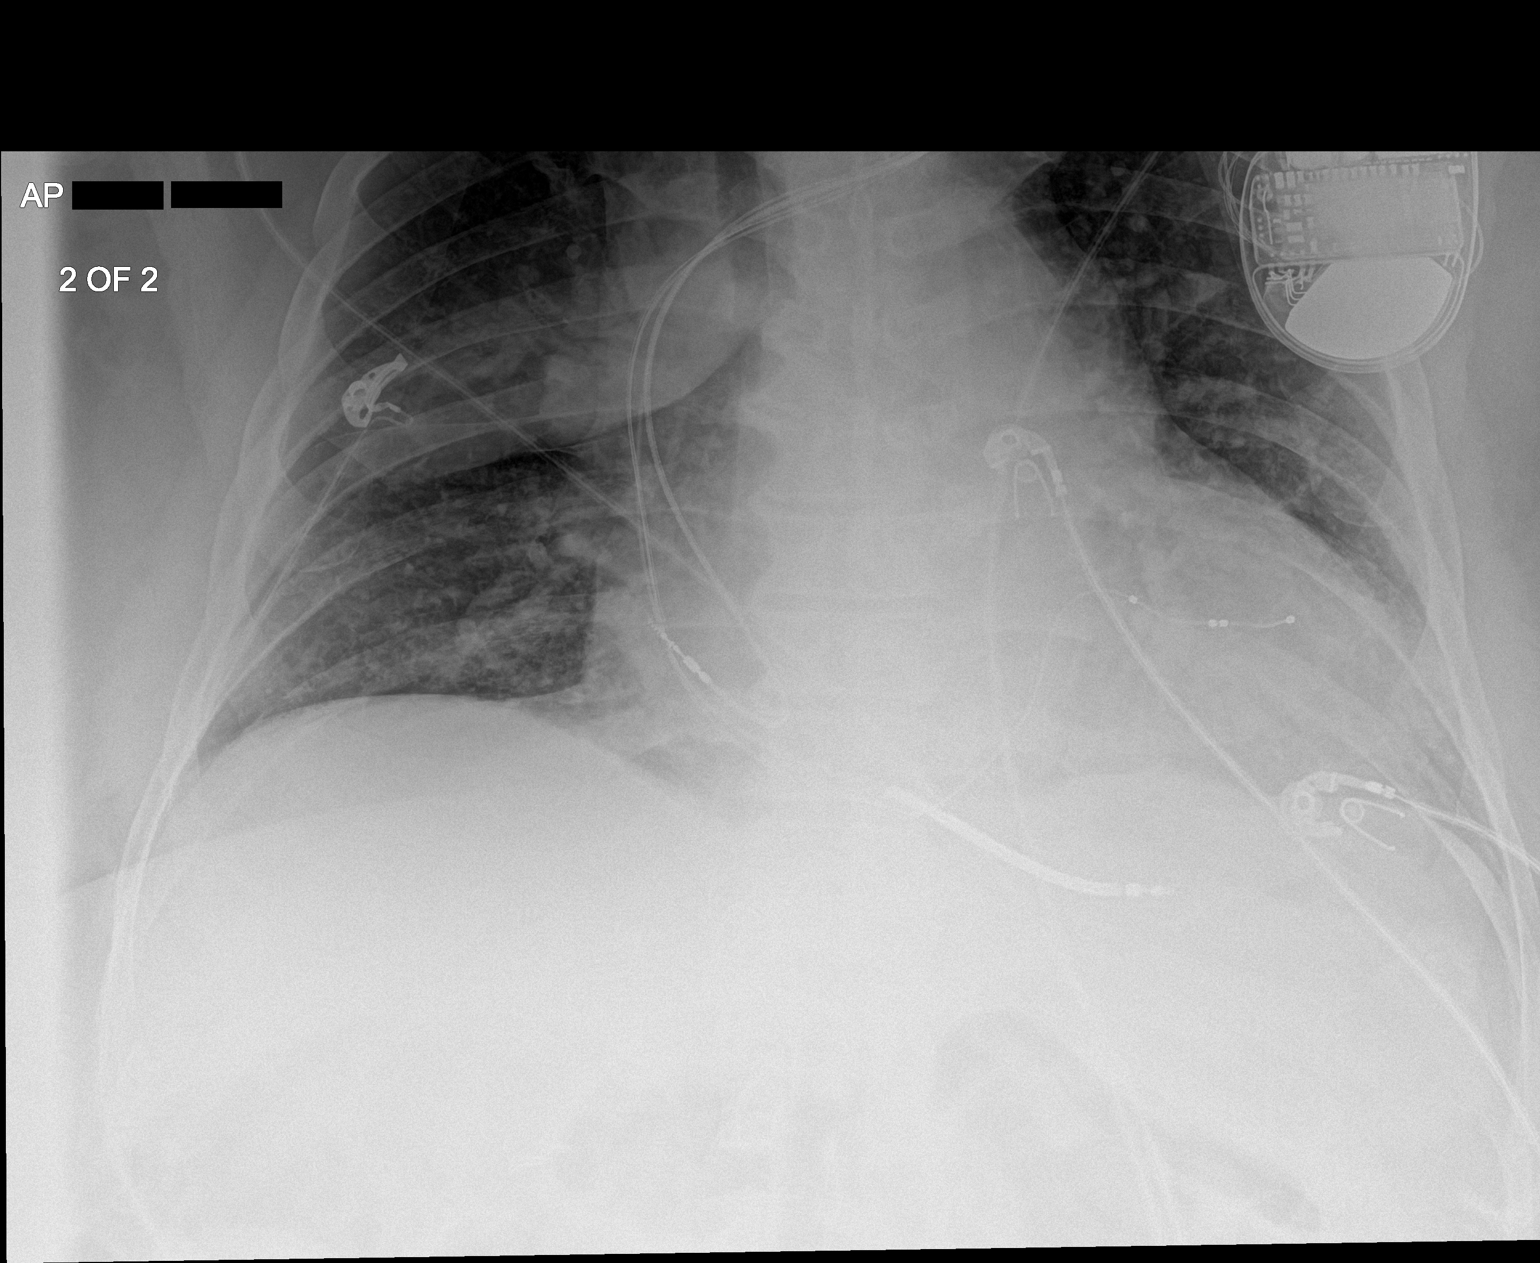

[2 of 2 positions shown; findings below may reference images not displayed]

FINDINGS: Cardiac shadow is stable with defibrillator again noted in
satisfactory position. Lungs are well aerated bilaterally. Increased
density is noted in the right upper lobe along the minor fissure in
the right suprahilar region consistent with acute infiltrate. No
other focal infiltrate is seen. No acute bony abnormality is noted.
IMPRESSION: Changes in the right upper lobe consistent with acute infiltrate.

Followup PA and lateral chest X-ray is recommended in 3-4 weeks
following trial of antibiotic therapy to ensure resolution and
exclude underlying malignancy.

## 2019-07-18 NOTE — Telephone Encounter (Signed)
Let patient know I agree with recommendation concerning Metolazone.

## 2019-07-20 ENCOUNTER — Telehealth: Payer: Self-pay

## 2019-07-20 IMAGING — DX DG CHEST 2V
2 series · 2 of 2 positions shown · non-contrast
Comparison: 10/04/2017

CLINICAL DATA: Sob/Celerino since [REDACTED],,hx chf

EXAM:
CHEST - 2 VIEW

[w chest pa]
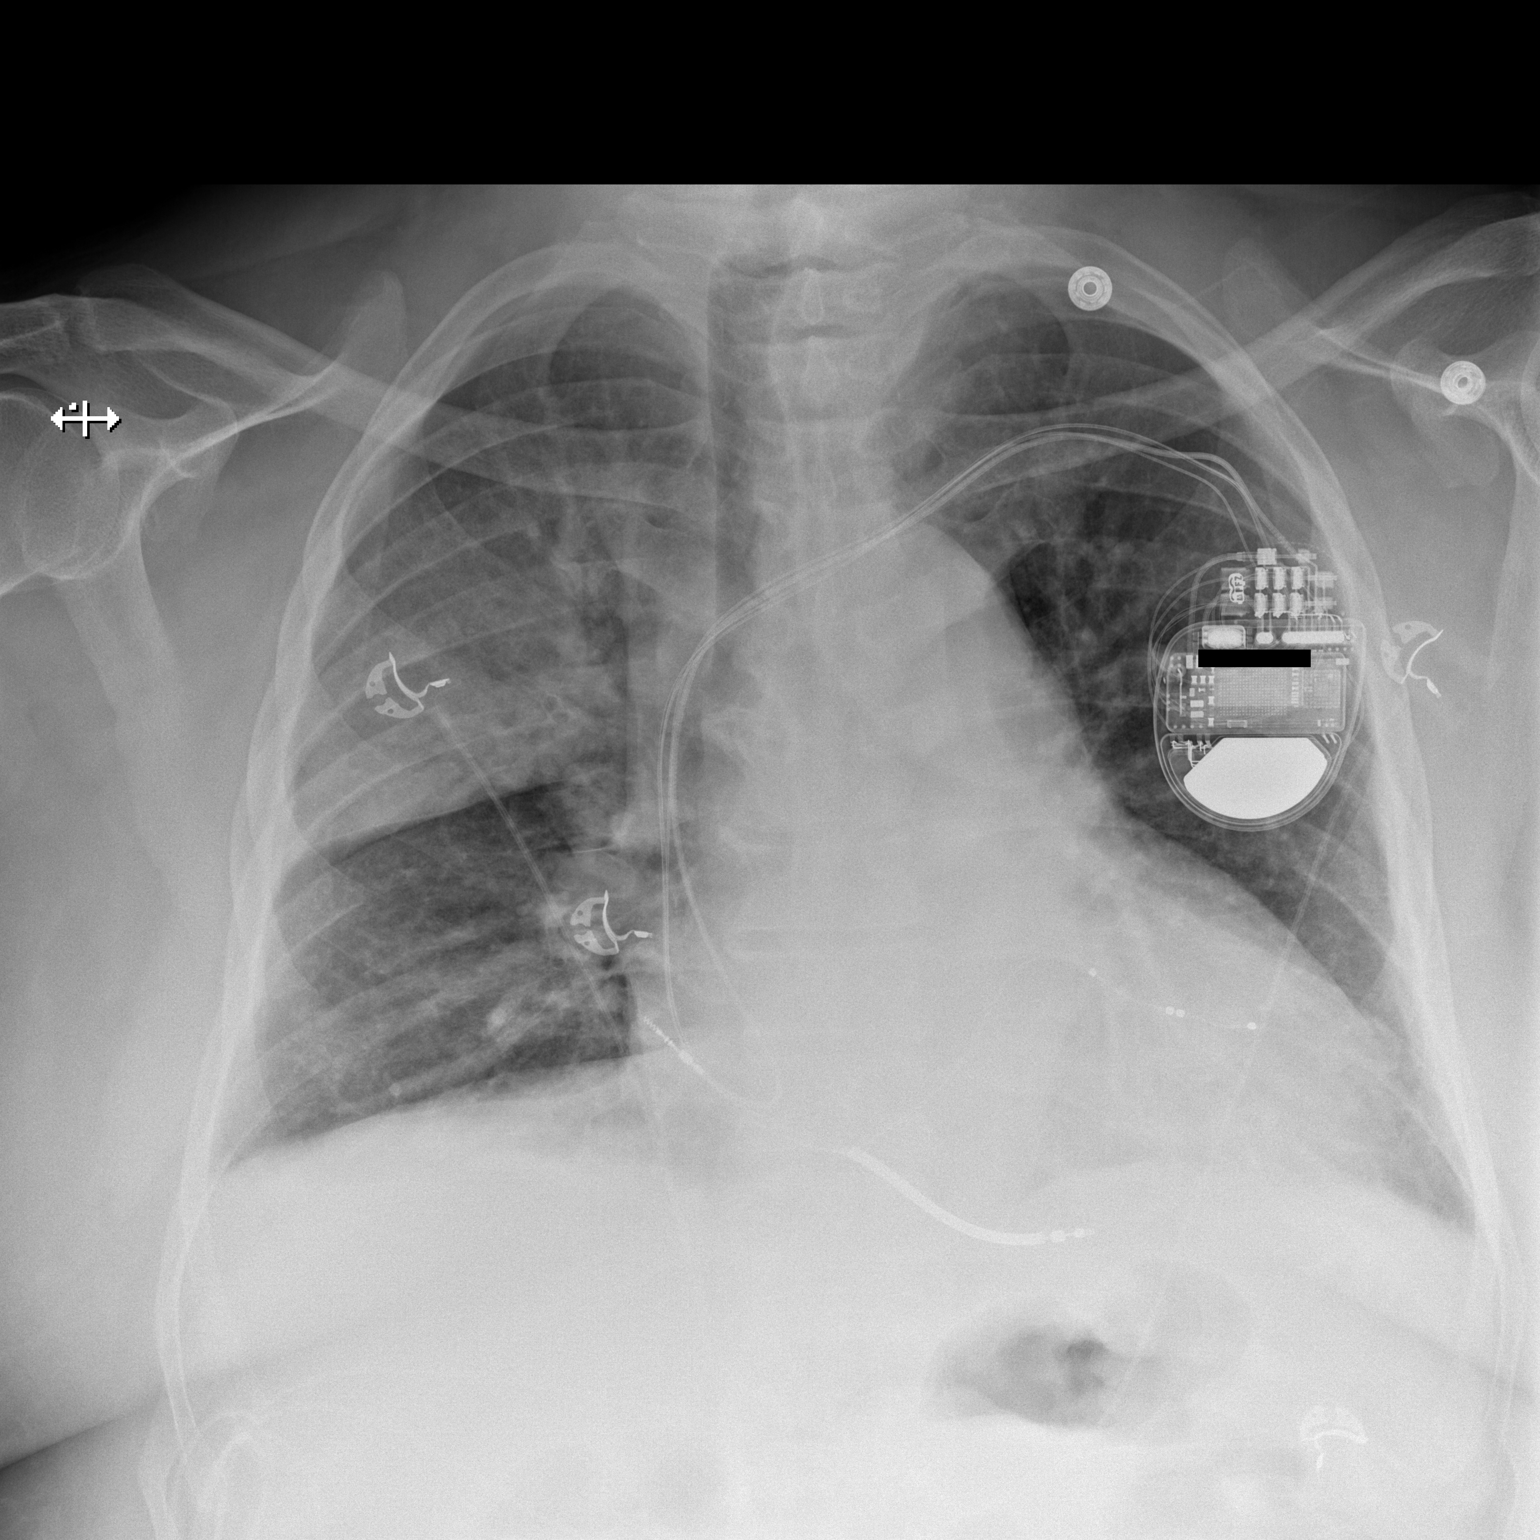

[w chest lat]
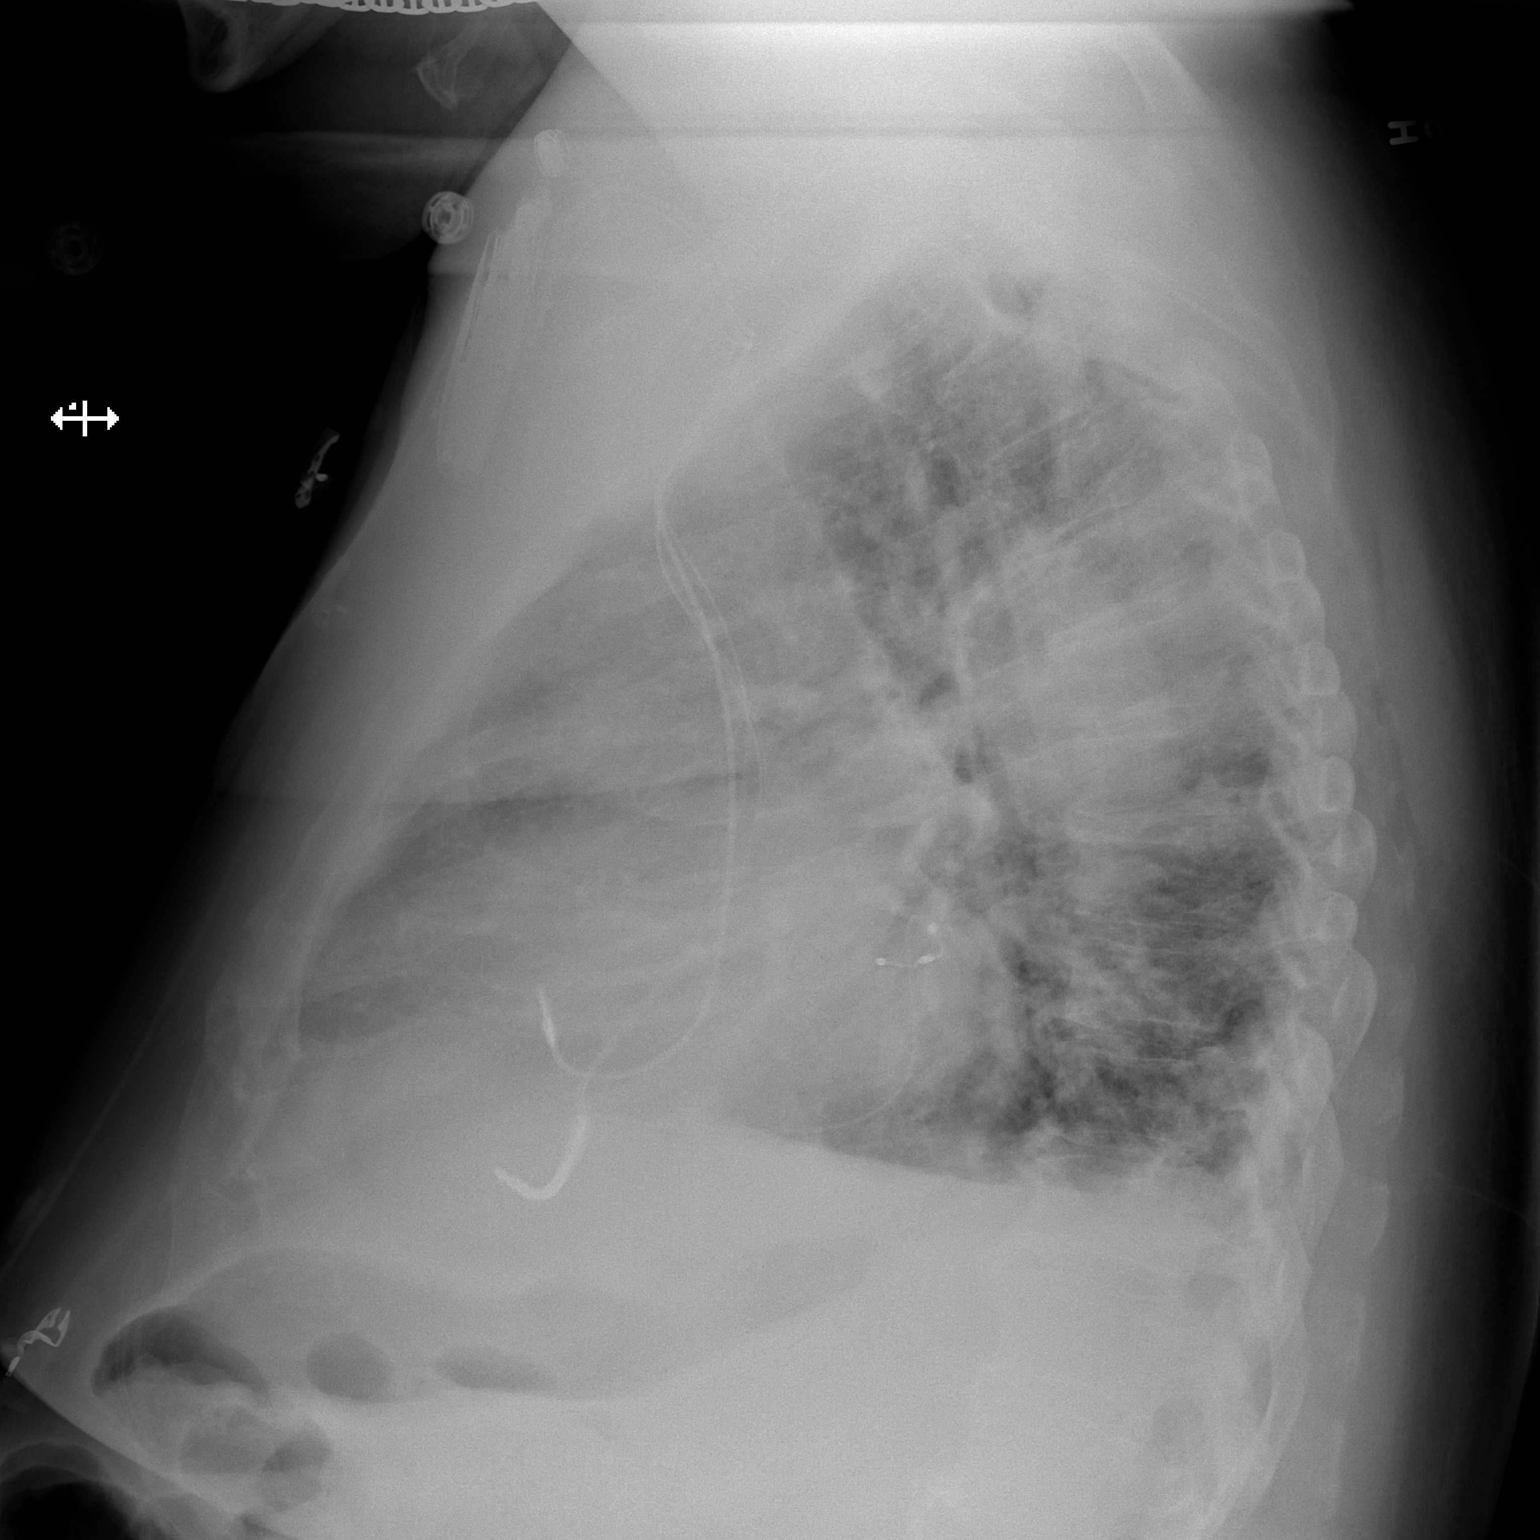

[2 of 2 positions shown; findings below may reference images not displayed]

FINDINGS: Persistent airspace consolidation in the right upper lobe. Left lung
clear. Stable left subclavian AICD.

Mild cardiomegaly stable. Blunting of posterior costophrenic angles
suggesting small effusions.

No pneumothorax.

Anterior vertebral endplate spurring at multiple levels in the lower
thoracic spine.
IMPRESSION: Little change in right upper lobe consolidation presumed pneumonia.

## 2019-07-20 NOTE — Telephone Encounter (Signed)
Patient referred to [facility] by AmyTucker, Device RN after remote transmission due to TriageHF (HF Risk) Alert: High (Ongoing):   Possible Fluid Accumulation.  Patient was advised per 07/17/19 phone note that Dr Curt Bears recommended he take Metolazone twice a week for next 2 weeks, then go back to once weekly.  Per note patient had not been taking Metolazone for at least 2 weeks because it makes him urinate frequently.  Asked if he took Metolazone on 2/2 and he said yes.  Advised 2/5 report does not show any improvement since taking the Metolazone.  He is due to take next dose on 07/22/2019 and advised to take 30 minutes prior to Furosemide dosage to achieve maximum potential.  Instructed patient and wife how to send manual remote transmission for review on 07/23/2019 to check effectiveness of 2/7 Metolazone dose.  Will discuss at next call if patient would like to continue ICM on monthly follow up basis.

## 2019-07-23 ENCOUNTER — Other Ambulatory Visit (HOSPITAL_COMMUNITY)
Admission: RE | Admit: 2019-07-23 | Discharge: 2019-07-23 | Disposition: A | Discharge status: Home / Self Care | Payer: Medicare Other | Source: Ambulatory Visit | Attending: Cardiology | Admitting: Cardiology

## 2019-07-23 ENCOUNTER — Ambulatory Visit (INDEPENDENT_AMBULATORY_CARE_PROVIDER_SITE_OTHER): Payer: Medicare Other

## 2019-07-23 DIAGNOSIS — Z01812 Encounter for preprocedural laboratory examination: Secondary | ICD-10-CM | POA: Insufficient documentation

## 2019-07-23 DIAGNOSIS — Z95810 Presence of automatic (implantable) cardiac defibrillator: Secondary | ICD-10-CM

## 2019-07-23 DIAGNOSIS — Z20822 Contact with and (suspected) exposure to covid-19: Secondary | ICD-10-CM | POA: Insufficient documentation

## 2019-07-23 DIAGNOSIS — I5022 Chronic systolic (congestive) heart failure: Secondary | ICD-10-CM

## 2019-07-23 LAB — SARS CORONAVIRUS 2 (TAT 6-24 HRS): SARS Coronavirus 2: NEGATIVE

## 2019-07-23 NOTE — Progress Notes (Signed)
EPIC Encounter for ICM Monitoring  Patient Name: Matthew Wood. is a 69 y.o. male Date: 07/23/2019 Primary Care Physican: Seward Carol, MD Primary Cardiologist: Tamala Julian Electrophysiologist: Curt Bears Bi-V Pacing:   94.8%  07/23/2019 Office Weight: 294 lbs   AT/AF                            1 Time in AT/AF  <0.1 hr/day (<0.1%) Longest AT/AF  70 seconds        1st ICM remote transmission.  Heart Failure questions reviewed.  Pt asymptomatic.   Per 07/17/19 phone note Dr Curt Bears recommended pt take Metolazone twice a week for next 2 weeks, then go back to once weekly.    Optivol thoracic impedance has returned close to baseline normal after taking weekly Metolazone dosages as instructed twice a week x 2 weeks per Dr Curt Bears.    Prescribed:   Furosemide 80 mg take 1 tablet daily.  Metolazone 2.5 mg Take 1 tablet by mouth once weekly if you gain 3-4 pounds in a day.  Taking Differently per script notes: Take 2.5 mg by mouth once a week.   Klor Con 10 mEq Take 10 mEq by mouth 2 (two) times daily.   Recommendations:  Advised to continue to take Metolazone instructed by Dr Curt Bears which is to take once weekly (per 07/17/2019 phone note).   Follow-up plan: ICM clinic phone appointment on 09/06/2019.   91 day device clinic remote transmission 09/05/2019.   Copy of ICM check sent to Dr. Curt Bears.   3 month ICM trend: 07/23/2019    1 Year ICM trend:       Rosalene Billings, RN 07/23/2019 3:31 PM

## 2019-07-24 ENCOUNTER — Ambulatory Visit (HOSPITAL_COMMUNITY)
Admission: RE | Admit: 2019-07-24 | Discharge: 2019-07-24 | Disposition: A | Discharge status: Home / Self Care | Payer: Medicare Other | Source: Ambulatory Visit | Attending: Nephrology | Admitting: Nephrology

## 2019-07-24 ENCOUNTER — Other Ambulatory Visit: Payer: Self-pay

## 2019-07-24 VITALS — BP 112/54 | HR 66 | Temp 97.2°F | Resp 18

## 2019-07-24 DIAGNOSIS — N184 Chronic kidney disease, stage 4 (severe): Secondary | ICD-10-CM | POA: Insufficient documentation

## 2019-07-24 LAB — CBC WITH DIFFERENTIAL/PLATELET
Abs Immature Granulocytes: 0.01 10*3/uL (ref 0.00–0.07)
Basophils Absolute: 0 10*3/uL (ref 0.0–0.1)
Basophils Relative: 0 %
Eosinophils Absolute: 0.2 10*3/uL (ref 0.0–0.5)
Eosinophils Relative: 3 %
HCT: 31.9 % — ABNORMAL LOW (ref 39.0–52.0)
Hemoglobin: 9.7 g/dL — ABNORMAL LOW (ref 13.0–17.0)
Immature Granulocytes: 0 %
Lymphocytes Relative: 35 %
Lymphs Abs: 2.3 10*3/uL (ref 0.7–4.0)
MCH: 27.2 pg (ref 26.0–34.0)
MCHC: 30.4 g/dL (ref 30.0–36.0)
MCV: 89.4 fL (ref 80.0–100.0)
Monocytes Absolute: 0.6 10*3/uL (ref 0.1–1.0)
Monocytes Relative: 9 %
Neutro Abs: 3.6 10*3/uL (ref 1.7–7.7)
Neutrophils Relative %: 53 %
Platelets: 177 10*3/uL (ref 150–400)
RBC: 3.57 MIL/uL — ABNORMAL LOW (ref 4.22–5.81)
RDW: 17 % — ABNORMAL HIGH (ref 11.5–15.5)
WBC: 6.7 10*3/uL (ref 4.0–10.5)
nRBC: 0 % (ref 0.0–0.2)

## 2019-07-24 LAB — POCT HEMOGLOBIN-HEMACUE: Hemoglobin: 10 g/dL — ABNORMAL LOW (ref 13.0–17.0)

## 2019-07-24 MED ORDER — DARBEPOETIN ALFA 40 MCG/0.4ML IJ SOSY
40.0000 ug | PREFILLED_SYRINGE | INTRAMUSCULAR | Status: DC
  Administered 2019-07-24: 40 ug via SUBCUTANEOUS

## 2019-07-24 MED ORDER — DARBEPOETIN ALFA 40 MCG/0.4ML IJ SOSY
PREFILLED_SYRINGE | INTRAMUSCULAR | Status: AC
  Filled 2019-07-24: qty 0.4

## 2019-07-26 ENCOUNTER — Other Ambulatory Visit: Payer: Self-pay

## 2019-07-26 ENCOUNTER — Ambulatory Visit (HOSPITAL_BASED_OUTPATIENT_CLINIC_OR_DEPARTMENT_OTHER): Payer: Medicare Other | Attending: Interventional Cardiology | Admitting: Cardiology

## 2019-07-26 DIAGNOSIS — I493 Ventricular premature depolarization: Secondary | ICD-10-CM | POA: Insufficient documentation

## 2019-07-26 DIAGNOSIS — G4733 Obstructive sleep apnea (adult) (pediatric): Secondary | ICD-10-CM | POA: Diagnosis not present

## 2019-07-27 NOTE — Procedures (Deleted)
    NAME: Matthew Wood. DATE OF BIRTH:  [birth year] MEDICAL RECORD NUMBER [id number]  LOCATION: [facility]  PHYSICIAN: Clorine Swing  DATE OF STUDY: 07/26/2019  SLEEP STUDY TYPE: Out of Center Sleep Test                REFERRING PHYSICIAN: Belva Crome, MD  INDICATION FOR STUDY: ***  EPWORTH SLEEPINESS SCORE:   HEIGHT: 6' (182.9 cm)  WEIGHT: 290 lb (131.5 kg)    Body mass index is 39.33 kg/m.  NECK SIZE: 21 in.  MEDICATIONS: ***  IMPRESSION:  ***    RECOMMENDATION:  ***   [place] Diplomate, American Board of Sleep Medicine  ELECTRONICALLY SIGNED ON:  07/27/2019, 7:18 PM [place] PH: [phone]   FX: [phone] [place]

## 2019-07-27 NOTE — Procedures (Signed)
   Patient Name: Matthew Wood, Matthew Wood Date:07/26/2019 Gender: Male D.O.B: 01/19/1951 Age (years): 68 Referring Provider: Daneen Schick Height (inches): 44 Interpreting Physician: Fransico Him MD, ABSM Weight (lbs): 290 RPSGT: Baxter Flattery BMI: 42 MRN: [id number] Neck Size: 21.00  CLINICAL INFORMATION Sleep Study Type: NPSG  Indication for sleep study: Congestive Heart Failure, Fatigue, Obesity, Snoring, Witnesses Apnea / Gasping During Sleep  Epworth Sleepiness Score: 12  SLEEP STUDY TECHNIQUE As per the AASM Manual for the Scoring of Sleep and Associated Events v2.3 (April 2016) with a hypopnea requiring 4% desaturations.  The channels recorded and monitored were frontal, central and occipital EEG, electrooculogram (EOG), submentalis EMG (chin), nasal and oral airflow, thoracic and abdominal wall motion, anterior tibialis EMG, snore microphone, electrocardiogram, and pulse oximetry.  MEDICATIONS Medications self-administered by patient taken the night of the study : N/A  SLEEP ARCHITECTURE The study was initiated at 10:58:58 PM and ended at 5:30:55 AM.  Sleep onset time was 48.7 minutes and the sleep efficiency was 42.9%. The total sleep time was 168 minutes.  Stage REM latency was 178.5 minutes.  The patient spent 12.8% of the night in stage N1 sleep, 80.1% in stage N2 sleep, 0.0% in stage N3 and 7.1% in REM.  Alpha intrusion was absent.  Supine sleep was 100.00%.  RESPIRATORY PARAMETERS The overall apnea/hypopnea index (AHI) was 28.2 per hour. There were 0 total apneas, including 0 obstructive, 0 central and 0 mixed apneas. There were 79 hypopneas and 0 RERAs.  The AHI during Stage REM sleep was 25.0 per hour.  AHI while supine was 28.2 per hour.  The mean oxygen saturation was 94.6%. The minimum SpO2 during sleep was 89.0%.  soft snoring was noted during this study.  CARDIAC DATA The 2 lead EKG demonstrated sinus rhythm, pacemaker generated. The mean heart rate  was 64.7 beats per minute. Other EKG findings include: PVCs.  LEG MOVEMENT DATA The total PLMS were 0 with a resulting PLMS index of 0.0. Associated arousal with leg movement index was 0.0 .  IMPRESSIONS - Moderate obstructive sleep apnea occurred during this study (AHI = 28.2/h). - No significant central sleep apnea occurred during this study (CAI = 0.0/h). - The patient had minimal or no oxygen desaturation during the study (Min O2 = 89.0%) - The patient snored with soft snoring volume. - EKG findings include PVCs. - Clinically significant periodic limb movements did not occur during sleep. No significant associated arousals.  DIAGNOSIS - Obstructive Sleep Apnea (327.23 [G47.33 ICD-10])  RECOMMENDATIONS - Therapeutic CPAP titration to determine optimal pressure required to alleviate sleep disordered breathing. - Positional therapy avoiding supine position during sleep. - Avoid alcohol, sedatives and other CNS depressants that may worsen sleep apnea and disrupt normal sleep architecture. - Sleep hygiene should be reviewed to assess factors that may improve sleep quality. - Weight management and regular exercise should be initiated or continued if appropriate.  [Electronically signed] 07/27/2019 07:18 PM  Fransico Him MD, [place], American Board of Sleep Medicine   NPI: [id number]

## 2019-08-02 ENCOUNTER — Telehealth: Payer: Self-pay | Admitting: *Deleted

## 2019-08-02 DIAGNOSIS — G4733 Obstructive sleep apnea (adult) (pediatric): Secondary | ICD-10-CM

## 2019-08-02 NOTE — Telephone Encounter (Signed)
-----   Message from Sueanne Margarita, MD sent at 07/27/2019  7:20 PM EST ----- Please let patient know that they have sleep apnea and recommend CPAP titration. Please set up titration in the sleep lab.

## 2019-08-02 NOTE — Telephone Encounter (Signed)
-----   Message from Freada Bergeron, [place] sent at 08/02/2019  3:32 PM EST ----- Regarding: precert  recommend CPAP titration.

## 2019-08-02 NOTE — Telephone Encounter (Signed)
Informed patient of sleep study results and patient understanding was verbalized. Patient understands his sleep study showed they have sleep apnea and recommend CPAP titration. Pt is aware and agreeable to his results. Titration sent to sleep pool.  

## 2019-08-02 NOTE — Telephone Encounter (Signed)
Staff message sent to Matthew Wood ok to schedule titration study. Per [facility] web portal no PA is required. Decision ID #:[id number].

## 2019-08-06 NOTE — Addendum Note (Signed)
Addended by: Freada Bergeron on: 08/06/2019 01:20 PM   Modules accepted: Orders

## 2019-08-07 ENCOUNTER — Telehealth: Payer: Self-pay | Admitting: *Deleted

## 2019-08-07 NOTE — Telephone Encounter (Signed)
Patient is scheduled for CPAP Titration on 08/21/19. Pt is scheduled for COVID screening on 08/18/19 10:05 prior to titration.  Patient understands his titration study will be done at [facility] sleep lab. Patient understands he will receive a letter in a week or so detailing appointment, date, time, and location. Patient understands to call if he does not receive the letter  in a timely manner. Patient agrees with treatment and thanked me for call.

## 2019-08-07 NOTE — Telephone Encounter (Signed)
-----   Message from Lauralee Evener, [place] sent at 08/02/2019  6:09 PM EST ----- Regarding: RE: precert Ok to schedule titration. Per [facility] web portal no PA is required. Decision IF#:[id number]. ----- Message ----- From: Freada Bergeron, CMA Sent: 08/02/2019   3:32 PM EST To: [facility] Div Sleep Studies Subject: precert                                         recommend CPAP titration.

## 2019-08-18 ENCOUNTER — Other Ambulatory Visit (HOSPITAL_COMMUNITY)
Admission: RE | Admit: 2019-08-18 | Discharge: 2019-08-18 | Disposition: A | Discharge status: Home / Self Care | Payer: Medicare Other | Source: Ambulatory Visit | Attending: Cardiology | Admitting: Cardiology

## 2019-08-18 DIAGNOSIS — Z01812 Encounter for preprocedural laboratory examination: Secondary | ICD-10-CM | POA: Insufficient documentation

## 2019-08-18 DIAGNOSIS — Z20822 Contact with and (suspected) exposure to covid-19: Secondary | ICD-10-CM | POA: Diagnosis not present

## 2019-08-18 LAB — SARS CORONAVIRUS 2 (TAT 6-24 HRS): SARS Coronavirus 2: NEGATIVE

## 2019-08-20 ENCOUNTER — Telehealth: Payer: Self-pay | Admitting: Interventional Cardiology

## 2019-08-20 NOTE — Telephone Encounter (Signed)
Pt c/o Shortness Of Breath: STAT if SOB developed within the last 24 hours or pt is noticeably SOB on the phone  1. Are you currently SOB (can you hear that pt is SOB on the phone)? No- off and off 2 to 3 times a day  2. How long have you been experiencing SOB? Several  Months- seems to be getting worse  3. Are you SOB when sitting or when up moving around? both  4. Are you currently experiencing any other symptoms? no

## 2019-08-20 NOTE — Telephone Encounter (Signed)
Spoke with wife and she states pt's SOB has been worsening the last couple of months.  Occurs with exertion and rest.  States pt has not had his Metolazone the last two weeks.  Doesn't like to take it due to increased urination.  Has had increased salt in diet recently. Had a greasy hamburger, 2 sodas and 2 sweet teas on Saturday.  Scheduled pt to come in to see Dr. Tamala Julian on Wednesday. Wife appreciative for call.    Pt has not started dialysis.  Wife unsure of when it will start.

## 2019-08-21 ENCOUNTER — Ambulatory Visit (HOSPITAL_BASED_OUTPATIENT_CLINIC_OR_DEPARTMENT_OTHER): Payer: Medicare Other | Admitting: Cardiology

## 2019-08-21 ENCOUNTER — Other Ambulatory Visit: Payer: Self-pay

## 2019-08-21 ENCOUNTER — Ambulatory Visit (HOSPITAL_COMMUNITY)
Admission: RE | Admit: 2019-08-21 | Discharge: 2019-08-21 | Disposition: A | Discharge status: Home / Self Care | Payer: Medicare Other | Source: Ambulatory Visit | Attending: Nephrology | Admitting: Nephrology

## 2019-08-21 VITALS — BP 104/52 | HR 62 | Temp 97.0°F | Resp 18

## 2019-08-21 VITALS — Temp 98.0°F | Ht 72.0 in | Wt 290.0 lb

## 2019-08-21 DIAGNOSIS — N185 Chronic kidney disease, stage 5: Secondary | ICD-10-CM | POA: Diagnosis not present

## 2019-08-21 DIAGNOSIS — N184 Chronic kidney disease, stage 4 (severe): Secondary | ICD-10-CM

## 2019-08-21 DIAGNOSIS — G4733 Obstructive sleep apnea (adult) (pediatric): Secondary | ICD-10-CM | POA: Insufficient documentation

## 2019-08-21 DIAGNOSIS — D631 Anemia in chronic kidney disease: Secondary | ICD-10-CM | POA: Insufficient documentation

## 2019-08-21 LAB — CBC WITH DIFFERENTIAL/PLATELET
Abs Immature Granulocytes: 0.02 10*3/uL (ref 0.00–0.07)
Basophils Absolute: 0 10*3/uL (ref 0.0–0.1)
Basophils Relative: 0 %
Eosinophils Absolute: 0.2 10*3/uL (ref 0.0–0.5)
Eosinophils Relative: 3 %
HCT: 28.6 % — ABNORMAL LOW (ref 39.0–52.0)
Hemoglobin: 8.7 g/dL — ABNORMAL LOW (ref 13.0–17.0)
Immature Granulocytes: 0 %
Lymphocytes Relative: 29 %
Lymphs Abs: 2.2 10*3/uL (ref 0.7–4.0)
MCH: 27.2 pg (ref 26.0–34.0)
MCHC: 30.4 g/dL (ref 30.0–36.0)
MCV: 89.4 fL (ref 80.0–100.0)
Monocytes Absolute: 0.6 10*3/uL (ref 0.1–1.0)
Monocytes Relative: 8 %
Neutro Abs: 4.4 10*3/uL (ref 1.7–7.7)
Neutrophils Relative %: 60 %
Platelets: 134 10*3/uL — ABNORMAL LOW (ref 150–400)
RBC: 3.2 MIL/uL — ABNORMAL LOW (ref 4.22–5.81)
RDW: 17 % — ABNORMAL HIGH (ref 11.5–15.5)
WBC: 7.4 10*3/uL (ref 4.0–10.5)
nRBC: 0 % (ref 0.0–0.2)

## 2019-08-21 LAB — COMPREHENSIVE METABOLIC PANEL
ALT: 17 U/L (ref 0–44)
AST: 17 U/L (ref 15–41)
Albumin: 3.8 g/dL (ref 3.5–5.0)
Alkaline Phosphatase: 67 U/L (ref 38–126)
Anion gap: 16 — ABNORMAL HIGH (ref 5–15)
BUN: 150 mg/dL — ABNORMAL HIGH (ref 8–23)
CO2: 18 mmol/L — ABNORMAL LOW (ref 22–32)
Calcium: 9.7 mg/dL (ref 8.9–10.3)
Chloride: 108 mmol/L (ref 98–111)
Creatinine, Ser: 5.87 mg/dL — ABNORMAL HIGH (ref 0.61–1.24)
GFR calc Af Amer: 11 mL/min — ABNORMAL LOW (ref >60)
GFR calc non Af Amer: 9 mL/min — ABNORMAL LOW (ref >60)
Glucose, Bld: 50 mg/dL — ABNORMAL LOW (ref 70–99)
Potassium: 3.9 mmol/L (ref 3.5–5.1)
Sodium: 142 mmol/L (ref 135–145)
Total Bilirubin: 0.6 mg/dL (ref 0.3–1.2)
Total Protein: 7 g/dL (ref 6.5–8.1)

## 2019-08-21 LAB — PHOSPHORUS: Phosphorus: 6.3 mg/dL — ABNORMAL HIGH (ref 2.5–4.6)

## 2019-08-21 LAB — FERRITIN: Ferritin: 613 ng/mL — ABNORMAL HIGH (ref 24–336)

## 2019-08-21 LAB — IRON AND TIBC
Iron: 51 ug/dL (ref 45–182)
Saturation Ratios: 22 % (ref 17.9–39.5)
TIBC: 228 ug/dL — ABNORMAL LOW (ref 250–450)
UIBC: 177 ug/dL

## 2019-08-21 LAB — POCT HEMOGLOBIN-HEMACUE: Hemoglobin: 8.5 g/dL — ABNORMAL LOW (ref 13.0–17.0)

## 2019-08-21 MED ORDER — DARBEPOETIN ALFA 40 MCG/0.4ML IJ SOSY
PREFILLED_SYRINGE | INTRAMUSCULAR | Status: AC
  Administered 2019-08-21: 40 ug via SUBCUTANEOUS
  Filled 2019-08-21: qty 0.4

## 2019-08-21 MED ORDER — DARBEPOETIN ALFA 40 MCG/0.4ML IJ SOSY: 40.0000 ug | PREFILLED_SYRINGE | INTRAMUSCULAR | Status: DC

## 2019-08-21 NOTE — Progress Notes (Signed)
Cardiology Office Note:    Date:  08/22/2019   ID:  Matthew Wood., DOB [birth year], MRN [id number]  PCP:  Seward Carol, MD  Cardiologist:  Sinclair Grooms, MD   Referring MD: Seward Carol, MD   Chief Complaint  Patient presents with  . Coronary Artery Disease  . Congestive Heart Failure  . Hypertension    History of Present Illness:    Matthew Wood. is a 69 y.o. male with a hx of CAD, chronic combined systolic and diastolic heart failure, HTN, HLD, LBBB, multinodular goiter, OSA on CPAP, DM, CVA and CKD stage III-IV. Recently diagnosed to have ejection fraction by echo in the 30-35% range in the setting of ischemic cardiomyopathy and CRT-D implanted 05/2016.  There have been instances at home when he gets extremely short of breath.  His wife states that he will go from door-to-door hoping to get fresh air that will help him to not feel that short of breath.  OptiVol recordings from his device demonstrate chronic volume overload.  After evaluating the last set of data, I have recommended that he take metolazone once per week and continue furosemide as listed, 80 mg twice daily.  He denies angina.  He has not had syncope.  Past Medical History:  Diagnosis Date  . Anemia   . CHF (congestive heart failure) ([place])   . Coronary artery disease    a. LHC 04/27/10 left main patent, 30-40% LAD, mid circumflex 30%, 80% mid RCA accounting for the appearance of an infract/peri-infract ischemia on nuclear testing, LV EF of 30-45%--> medical therapy  . CVA (cerebrovascular accident) ([place]) 2011   potine; "little balance problems since" (05/25/2016)  . Dyspnea   . Gout   . History of kidney stones    "passed them" (05/25/2016)  . Hyperlipidemia   . Hypertension   . LBBB (left bundle branch block)   . Multinodular goiter   . Obesity   . OSA on CPAP   . Presence of permanent cardiac pacemaker   . Renal insufficiency   . Type II diabetes mellitus ([place])     Past Surgical  History:  Procedure Laterality Date  . AV FISTULA PLACEMENT Right 05/25/2019   Procedure: ARTERIOVENOUS (AV) FISTULA CREATION RIGHT ARM;  Surgeon: Marty Heck, MD;  Location: [place];  Service: Vascular;  Laterality: Right;  . COLONOSCOPY WITH PROPOFOL N/A 04/16/2014   Procedure: COLONOSCOPY WITH PROPOFOL;  Surgeon: [name], MD;  Location: [facility];  Service: Endoscopy;  Laterality: N/A;  . EP IMPLANTABLE DEVICE N/A 05/25/2016   Procedure: BiV ICD Insertion CRT-D;  Surgeon: [name], MD;  Location: [place] [facility];  Service: Cardiovascular;  Laterality: N/A;  . INSERT / REPLACE / REMOVE PACEMAKER  05/25/2016   biventricular pacemaker  . LAPAROSCOPIC CHOLECYSTECTOMY      Current Medications: Current Meds  Medication Sig  . allopurinol (ZYLOPRIM) 300 MG tablet Take 300 mg by mouth daily.  Marland Kitchen amLODipine-benazepril (LOTREL) 10-40 MG per capsule Take 1 capsule by mouth every morning.   Marland Kitchen aspirin EC 81 MG tablet Take 81 mg by mouth at bedtime.  . carvedilol (COREG) 25 MG tablet Take 25 mg by mouth 2 (two) times daily with a meal.  . cloNIDine (CATAPRES) 0.2 MG tablet Take 0.1-0.2 mg by mouth See admin instructions. Take 0.1 mg by mouth in the morning and 0.2 mg in the evening  . furosemide (LASIX) 80 MG tablet Take 80 mg by mouth 2 (  two) times daily.   Marland Kitchen HUMALOG KWIKPEN 100 UNIT/ML KiwkPen Inject 14 Units into the skin daily after breakfast.   . insulin glargine (LANTUS) 100 UNIT/ML injection Inject 44 Units into the skin at bedtime.   . isosorbide-hydrALAZINE (BIDIL) 20-37.5 MG tablet Take 1 tablet by mouth 2 (two) times daily.  Marland Kitchen KLOR-CON M10 10 MEQ tablet TAKE 1 TABLET (10 MEQ TOTAL) BY MOUTH 2 (TWO) TIMES DAILY.  . metolazone (ZAROXOLYN) 2.5 MG tablet Take 1 tablet by mouth once weekly if you gain 3-4 pounds in a day  . NON FORMULARY CPAP: At bedtime  . rosuvastatin (CRESTOR) 20 MG tablet Take 20 mg by mouth daily.     Allergies:   Patient has no known  allergies.   Social History   Socioeconomic History  . Marital status: Married    Spouse name: Not on file  . Number of children: 1  . Years of education: Not on file  . Highest education level: Not on file  Occupational History  . Occupation: Diplomatic Services operational officer: [employer]  Tobacco Use  . Smoking status: Former Smoker    Packs/day: 0.50    Years: 27.00    Pack years: 13.50    Types: Cigarettes    Quit date: 03/25/2010    Years since quitting: 9.4  . Smokeless tobacco: Never Used  Substance and Sexual Activity  . Alcohol use: Not Currently    Comment: 05/25/2016 "was an occasional drinker; maybe 4 drinks/week; stopped after stroke in 2011"  . Drug use: No  . Sexual activity: Not on file  Other Topics Concern  . Not on file  Social History Narrative  . Not on file   Social Determinants of Health   Financial Resource Strain:   . Difficulty of Paying Living Expenses: Not on file  Food Insecurity:   . Worried About Charity fundraiser in the Last Year: Not on file  . Ran Out of Food in the Last Year: Not on file  Transportation Needs:   . Lack of Transportation (Medical): Not on file  . Lack of Transportation (Non-Medical): Not on file  Physical Activity:   . Days of Exercise per Week: Not on file  . Minutes of Exercise per Session: Not on file  Stress:   . Feeling of Stress : Not on file  Social Connections:   . Frequency of Communication with Friends and Family: Not on file  . Frequency of Social Gatherings with Friends and Family: Not on file  . Attends Religious Services: Not on file  . Active Member of Clubs or Organizations: Not on file  . Attends Archivist Meetings: Not on file  . Marital Status: Not on file     Family History: The patient's family history includes Breast cancer in his mother; Diabetes type II in his father; Heart attack in his father; Hypertension in his mother and sister; Stroke in his father.  ROS:   Please see  the history of present illness.    A fistula has been placed in his right arm and is now maturing.  He is seeing nephrology more frequently.  He does not understand diet and fluid restriction as these are overridden by his taste for food.  All other systems reviewed and are negative.  EKGs/Labs/Other Studies Reviewed:    The following studies were reviewed today: No new data.  Last echo 2017. 2D Doppler echocardiogram should be done soon.  EKG:  EKG AV  sequential pacing  Recent Labs: 08/21/2019: ALT 17; BUN 150; Creatinine, Ser 5.87; Hemoglobin 8.5; Platelets 134; Potassium 3.9; Sodium 142  Recent Lipid Panel    Component Value Date/Time   CHOL  02/08/2010 0600    171        ATP III CLASSIFICATION:  <200     mg/dL   Desirable  200-239  mg/dL   Borderline High  >=240    mg/dL   High          TRIG 248 (H) 02/08/2010 0600   HDL 34 (L) 02/08/2010 0600   CHOLHDL 5.0 02/08/2010 0600   VLDL 50 (H) 02/08/2010 0600   LDLCALC  02/08/2010 0600    87        Total Cholesterol/HDL:CHD Risk Coronary Heart Disease Risk Table                     Men   Women  1/2 Average Risk   3.4   3.3  Average Risk       5.0   4.4  2 X Average Risk   9.6   7.1  3 X Average Risk  23.4   11.0        Use the calculated Patient Ratio above and the CHD Risk Table to determine the patient's CHD Risk.        ATP III CLASSIFICATION (LDL):  <100     mg/dL   Optimal  100-129  mg/dL   Near or Above                    Optimal  130-159  mg/dL   Borderline  160-189  mg/dL   High  >190     mg/dL   Very High    Physical Exam:    VS:  BP (!) 114/52   Pulse 87   Ht 6' (1.829 m)   Wt 284 lb 3.2 oz (128.9 kg)   SpO2 99%   BMI 38.54 kg/m     Wt Readings from Last 3 Encounters:  08/22/19 284 lb 3.2 oz (128.9 kg)  08/21/19 290 lb (131.5 kg)  07/26/19 290 lb (131.5 kg)     GEN: Morbid obesity. No acute distress HEENT: Normal NECK: Elevated at 45 degrees. LYMPHATICS: No lymphadenopathy CARDIAC:  RRR with  2/6 apical systolic murmur, S4 gallop, and bilateral pedal and ankle edema. VASCULAR:  Normal Pulses. No bruits. RESPIRATORY:  Clear to auscultation without rales, wheezing or rhonchi  ABDOMEN: Soft, non-tender, non-distended, No pulsatile mass, MUSCULOSKELETAL: No deformity  SKIN: Warm and dry NEUROLOGIC:  Alert and oriented x 3 PSYCHIATRIC:  Normal affect   ASSESSMENT:    1. Coronary artery disease involving native coronary artery of native heart without angina pectoris   2. OSA (obstructive sleep apnea)   3. Chronic systolic (congestive) heart failure ([place])   4. CKD (chronic kidney disease), stage IV ([place])   5. AICD (automatic cardioverter/defibrillator) present   6. Type 2 diabetes mellitus with nephropathy ([place])   7. Educated about COVID-19 virus infection    PLAN:    In order of problems listed above:  1. Secondary prevention discussed 2. Not compliant with CPAP.  Is getting a new device.  Had sleep study last evening. 3. We have asked him to increase metolazone to 2.5 mg once per week.  Furosemide will remain at the current dose. 4. Most recent creatinine was greater than 5.  He has a fistula in the right arm  now.  Significant discussion concerning salt and fluid restriction.  Recommend 2 g sodium or less and 1.5 L or less of fluid per day. 5. AICD and OptiVol checks are helpful relative to volume management. 6. Hemoglobin A1c goal less than 7.  He has had some instances of hypoglycemia.  Instructed to speak with Dr. Buddy Duty concerning adjustment in insulin dose.  Overall education and awareness concerning primary/secondary risk prevention was discussed in detail: LDL less than 70, hemoglobin A1c less than 7, blood pressure target less than 130/80 mmHg, >150 minutes of moderate aerobic activity per week, avoidance of smoking, weight control (via diet and exercise), and continued surveillance/management of/for obstructive sleep apnea.  Guideline directed therapy for left  ventricular systolic dysfunction: Angiotensin receptor-neprilysin inhibitor (ARNI)-Entresto; beta-blocker therapy - carvedilol or metoprolol succinate; mineralocorticoid receptor antagonist (MRA) therapy -spironolactone or eplerenone.  These therapies have been shown to improve clinical outcomes including reduction of rehospitalization survival, and acute heart failure.    Medication Adjustments/Labs and Tests Ordered: Current medicines are reviewed at length with the patient today.  Concerns regarding medicines are outlined above.  Orders Placed This Encounter  Procedures  . EKG 12-Lead  . ECHOCARDIOGRAM COMPLETE   No orders of the defined types were placed in this encounter.   Patient Instructions  Medication Instructions:  Your physician recommends that you continue on your current medications as directed. Please refer to the Current Medication list given to you today.  *If you need a refill on your cardiac medications before your next appointment, please call your pharmacy*   Lab Work: None If you have labs (blood work) drawn today and your tests are completely normal, you will receive your results only by: Marland Kitchen MyChart Message (if you have MyChart) OR . A paper copy in the mail If you have any lab test that is abnormal or we need to change your treatment, we will call you to review the results.   Testing/Procedures: Your physician has requested that you have an echocardiogram. Echocardiography is a painless test that uses sound waves to create images of your heart. It provides your doctor with information about the size and shape of your heart and how well your heart's chambers and valves are working. This procedure takes approximately one hour. There are no restrictions for this procedure.     Follow-Up: At [facility], you and your health needs are our priority.  As part of our continuing mission to provide you with exceptional heart care, we have created designated  Provider Care Teams.  These Care Teams include your primary Cardiologist (physician) and Advanced Practice Providers (APPs -  Physician Assistants and Nurse Practitioners) who all work together to provide you with the care you need, when you need it.  We recommend signing up for the patient portal called "MyChart".  Sign up information is provided on this After Visit Summary.  MyChart is used to connect with patients for Virtual Visits (Telemedicine).  Patients are able to view lab/test results, encounter notes, upcoming appointments, etc.  Non-urgent messages can be sent to your provider as well.   To learn more about what you can do with MyChart, go to [email or web address].    Your next appointment:   6 month(s)  The format for your next appointment:   In Person  Provider:   You may see Sinclair Grooms, MD or one of the following Advanced Practice Providers on your designated Care Team:    Truitt Merle, NP  Cecilie Kicks, NP  Kathyrn Drown, NP    Other Instructions      Signed, Sinclair Grooms, MD  08/22/2019 4:14 PM    [facility]

## 2019-08-22 ENCOUNTER — Encounter: Payer: Self-pay | Admitting: Interventional Cardiology

## 2019-08-22 ENCOUNTER — Ambulatory Visit (INDEPENDENT_AMBULATORY_CARE_PROVIDER_SITE_OTHER): Payer: Medicare Other | Admitting: Interventional Cardiology

## 2019-08-22 VITALS — BP 114/52 | HR 87 | Ht 72.0 in | Wt 284.2 lb

## 2019-08-22 DIAGNOSIS — E1121 Type 2 diabetes mellitus with diabetic nephropathy: Secondary | ICD-10-CM

## 2019-08-22 DIAGNOSIS — I5022 Chronic systolic (congestive) heart failure: Secondary | ICD-10-CM

## 2019-08-22 DIAGNOSIS — N184 Chronic kidney disease, stage 4 (severe): Secondary | ICD-10-CM | POA: Diagnosis not present

## 2019-08-22 DIAGNOSIS — I2510 Atherosclerotic heart disease of native coronary artery without angina pectoris: Secondary | ICD-10-CM

## 2019-08-22 DIAGNOSIS — Z7189 Other specified counseling: Secondary | ICD-10-CM

## 2019-08-22 DIAGNOSIS — G4733 Obstructive sleep apnea (adult) (pediatric): Secondary | ICD-10-CM | POA: Diagnosis not present

## 2019-08-22 DIAGNOSIS — Z95810 Presence of automatic (implantable) cardiac defibrillator: Secondary | ICD-10-CM

## 2019-08-22 NOTE — Procedures (Signed)
    Patient Name: Matthew Wood, Matthew Wood Date: 08/21/2019 Gender: Male D.O.B: 1951/04/07 Age (years): 89 Referring Provider: Fransico Him MD, ABSM Height (inches): 72 Interpreting Physician: Fransico Him MD, ABSM Weight (lbs): 290 RPSGT: Carolin Coy BMI: 39 MRN: [id number] Neck Size: 19.00  CLINICAL INFORMATION The patient is referred for a CPAP titration to treat sleep apnea.  SLEEP STUDY TECHNIQUE As per the AASM Manual for the Scoring of Sleep and Associated Events v2.3 (April 2016) with a hypopnea requiring 4% desaturations.  The channels recorded and monitored were frontal, central and occipital EEG, electrooculogram (EOG), submentalis EMG (chin), nasal and oral airflow, thoracic and abdominal wall motion, anterior tibialis EMG, snore microphone, electrocardiogram, and pulse oximetry. Continuous positive airway pressure (CPAP) was initiated at the beginning of the study and titrated to treat sleep-disordered breathing.  MEDICATIONS Medications self-administered by patient taken the night of the study : LANTUS  TECHNICIAN COMMENTS Comments added by technician: PATIENT WAS ORDERED AS A CPAP TITRATION. Comments added by scorer: N/A  RESPIRATORY PARAMETERS Optimal PAP Pressure (cm): 15  AHI at Optimal Pressure (/hr):0.0 Overall Minimal O2 (%):81.0  Supine % at Optimal Pressure (%):100 Minimal O2 at Optimal Pressure (%): 96.0   SLEEP ARCHITECTURE The study was initiated at 10:54:32 PM and ended at 4:54:02 AM.  Sleep onset time was 14.2 minutes and the sleep efficiency was 71.6%. The total sleep time was 257.5 minutes.  The patient spent 28.7% of the night in stage N1 sleep, 63.5% in stage N2 sleep, 0.0% in stage N3 and 7.8% in REM.Stage REM latency was 124.0 minutes  Wake after sleep onset was 87.8. Alpha intrusion was absent. Supine sleep was 100.00%.  CARDIAC DATA The 2 lead EKG demonstrated sinus rhythm. The mean heart rate was 67.7 beats per minute. Other EKG  findings include: PVCs.  LEG MOVEMENT DATA The total Periodic Limb Movements of Sleep (PLMS) were 0. The PLMS index was 0.0. A PLMS index of <15 is considered normal in adults.  IMPRESSIONS - The optimal PAP pressure was 15 cm of water. - Central sleep apnea was not noted during this titration (CAI = 1.6/h). - Moderate oxygen desaturations were observed during this titration (min O2 = 81.0%). - The patient snored with soft snoring volume during this titration study. - 2-lead EKG demonstrated: PVCs - Clinically significant periodic limb movements were not noted during this study. Arousals associated with PLMs were rare.  DIAGNOSIS - Obstructive Sleep Apnea (327.23 [G47.33 ICD-10])  RECOMMENDATIONS - Trial of CPAP therapy on 15 cm H2O with a Medium size Fisher&Paykel Full Face Mask Simplus mask and heated humidification. - Avoid alcohol, sedatives and other CNS depressants that may worsen sleep apnea and disrupt normal sleep architecture. - Sleep hygiene should be reviewed to assess factors that may improve sleep quality. - Weight management and regular exercise should be initiated or continued. - Return to [facility] Center for re-evaluation after 8 weeks of therapy  [Electronically signed] 08/22/2019 08:15 AM  Fransico Him MD, ABSM Diplomate, American Board of Sleep Medicine

## 2019-08-22 NOTE — Patient Instructions (Addendum)
Medication Instructions:  Your physician recommends that you continue on your current medications as directed. Please refer to the Current Medication list given to you today.  *If you need a refill on your cardiac medications before your next appointment, please call your pharmacy*   Lab Work: None If you have labs (blood work) drawn today and your tests are completely normal, you will receive your results only by: Marland Kitchen MyChart Message (if you have MyChart) OR . A paper copy in the mail If you have any lab test that is abnormal or we need to change your treatment, we will call you to review the results.   Testing/Procedures: Your physician has requested that you have an echocardiogram. Echocardiography is a painless test that uses sound waves to create images of your heart. It provides your doctor with information about the size and shape of your heart and how well your heart's chambers and valves are working. This procedure takes approximately one hour. There are no restrictions for this procedure.     Follow-Up: At [facility], you and your health needs are our priority.  As part of our continuing mission to provide you with exceptional heart care, we have created designated Provider Care Teams.  These Care Teams include your primary Cardiologist (physician) and Advanced Practice Providers (APPs -  Physician Assistants and Nurse Practitioners) who all work together to provide you with the care you need, when you need it.  We recommend signing up for the patient portal called "MyChart".  Sign up information is provided on this After Visit Summary.  MyChart is used to connect with patients for Virtual Visits (Telemedicine).  Patients are able to view lab/test results, encounter notes, upcoming appointments, etc.  Non-urgent messages can be sent to your provider as well.   To learn more about what you can do with MyChart, go to [email or web address].    Your next appointment:   6  month(s)  The format for your next appointment:   In Person  Provider:   You may see Sinclair Grooms, MD or one of the following Advanced Practice Providers on your designated Care Team:    Truitt Merle, NP  Cecilie Kicks, NP  Kathyrn Drown, NP    Other Instructions

## 2019-08-23 ENCOUNTER — Telehealth: Payer: Self-pay | Admitting: Interventional Cardiology

## 2019-08-23 ENCOUNTER — Telehealth: Payer: Self-pay | Admitting: *Deleted

## 2019-08-23 NOTE — Telephone Encounter (Signed)
Wife of pt called. Pt has had vomiting this morning. He has had a couple of spells of diarrhea as well. She thinks he may be dehydrated due to the vomiting and diarrhea The pt has other underlying health conditions and the wife just wanted to speak with a medical professional.

## 2019-08-23 NOTE — Telephone Encounter (Signed)
Patient saw Dr Tamala Julian yesterday.  I spoke with patient's wife who reports patient had diarrhea this past weekend. None since this weekend.  Today he had to sit out in the car and wait for awhile at an appointment. He vomited one time when he got home. Wife thinks he got overheated. He ate some toast and is feeling better.  Wife also states patient had some bright red rectal bleeding over the weekend.  She thinks it was hemorrhoids. None since.  I asked her to follow up with PCP if bleeding or vomiting reoccurs.

## 2019-08-23 NOTE — Telephone Encounter (Signed)
Informed patient of titration results and verbalized understanding was indicated. Patient understands his sleep study showed they had a successful PAP titration and let DME know that orders are in EPIC. Please set up 8 week OV with me.   Upon patient request DME selection is [facility] Patient understands he will be contacted by [facility] to set up his cpap. Patient understands to call if [facility] does not contact him with new setup in a timely manner. Patient understands they will be called once confirmation has been received from [facility] that they have received their new machine to schedule 10 week follow up appointment.  [facility] notified of new cpap order via electronic fax Please add to airview Patient was grateful for the call and thanked me.

## 2019-08-23 NOTE — Telephone Encounter (Signed)
-----   Message from Sueanne Margarita, MD sent at 08/22/2019  8:19 AM EST ----- Please let patient know that they had a successful PAP titration and let DME know that orders are in EPIC.  Please set up 8 week OV with me.

## 2019-09-05 ENCOUNTER — Ambulatory Visit (INDEPENDENT_AMBULATORY_CARE_PROVIDER_SITE_OTHER): Payer: Medicare Other | Admitting: *Deleted

## 2019-09-05 DIAGNOSIS — I5022 Chronic systolic (congestive) heart failure: Secondary | ICD-10-CM | POA: Diagnosis not present

## 2019-09-05 LAB — CUP PACEART REMOTE DEVICE CHECK
Battery Remaining Longevity: 37 mo
Battery Voltage: 2.95 V
Brady Statistic AP VP Percent: 57.41 %
Brady Statistic AP VS Percent: 0.2 %
Brady Statistic AS VP Percent: 41.76 %
Brady Statistic AS VS Percent: 0.63 %
Brady Statistic RA Percent Paced: 56.84 %
Brady Statistic RV Percent Paced: 96.38 %
Date Time Interrogation Session: 20210324033525
HighPow Impedance: 56 Ohm
Implantable Lead Implant Date: 20171212
Implantable Lead Implant Date: 20171212
Implantable Lead Implant Date: 20171212
Implantable Lead Location: 753858
Implantable Lead Location: 753859
Implantable Lead Location: 753860
Implantable Lead Model: 4298
Implantable Lead Model: 5076
Implantable Pulse Generator Implant Date: 20171212
Lead Channel Impedance Value: 114 Ohm
Lead Channel Impedance Value: 118.56 Ohm
Lead Channel Impedance Value: 118.56 Ohm
Lead Channel Impedance Value: 118.56 Ohm
Lead Channel Impedance Value: 123.5 Ohm
Lead Channel Impedance Value: 228 Ohm
Lead Channel Impedance Value: 228 Ohm
Lead Channel Impedance Value: 247 Ohm
Lead Channel Impedance Value: 247 Ohm
Lead Channel Impedance Value: 247 Ohm
Lead Channel Impedance Value: 285 Ohm
Lead Channel Impedance Value: 342 Ohm
Lead Channel Impedance Value: 342 Ohm
Lead Channel Impedance Value: 361 Ohm
Lead Channel Impedance Value: 399 Ohm
Lead Channel Impedance Value: 399 Ohm
Lead Channel Impedance Value: 418 Ohm
Lead Channel Impedance Value: 418 Ohm
Lead Channel Pacing Threshold Amplitude: 0.5 V
Lead Channel Pacing Threshold Amplitude: 0.875 V
Lead Channel Pacing Threshold Amplitude: 1 V
Lead Channel Pacing Threshold Pulse Width: 0.4 ms
Lead Channel Pacing Threshold Pulse Width: 0.4 ms
Lead Channel Pacing Threshold Pulse Width: 0.4 ms
Lead Channel Sensing Intrinsic Amplitude: 13.625 mV
Lead Channel Sensing Intrinsic Amplitude: 13.625 mV
Lead Channel Sensing Intrinsic Amplitude: 3.75 mV
Lead Channel Sensing Intrinsic Amplitude: 3.75 mV
Lead Channel Setting Pacing Amplitude: 1.5 V
Lead Channel Setting Pacing Amplitude: 2 V
Lead Channel Setting Pacing Amplitude: 2.5 V
Lead Channel Setting Pacing Pulse Width: 0.4 ms
Lead Channel Setting Pacing Pulse Width: 0.4 ms
Lead Channel Setting Sensing Sensitivity: 0.3 mV

## 2019-09-06 ENCOUNTER — Ambulatory Visit (INDEPENDENT_AMBULATORY_CARE_PROVIDER_SITE_OTHER): Payer: Medicare Other

## 2019-09-06 DIAGNOSIS — I5022 Chronic systolic (congestive) heart failure: Secondary | ICD-10-CM | POA: Diagnosis not present

## 2019-09-06 DIAGNOSIS — Z95810 Presence of automatic (implantable) cardiac defibrillator: Secondary | ICD-10-CM | POA: Diagnosis not present

## 2019-09-06 NOTE — Progress Notes (Signed)
ICD Remote  

## 2019-09-10 ENCOUNTER — Other Ambulatory Visit: Payer: Self-pay

## 2019-09-10 ENCOUNTER — Ambulatory Visit (HOSPITAL_COMMUNITY): Payer: Medicare Other | Attending: Cardiology

## 2019-09-10 DIAGNOSIS — I5022 Chronic systolic (congestive) heart failure: Secondary | ICD-10-CM

## 2019-09-10 NOTE — Progress Notes (Signed)
EPIC Encounter for ICM Monitoring  Patient Name: Matthew Wood. is a 69 y.o. male Date: 09/10/2019 Primary Care Physican: Seward Carol, MD Primary Cardiologist: Tamala Julian Electrophysiologist: Curt Bears Bi-V Pacing:   96.4%       07/23/2019 Office Weight: 294 lbs                                                       Heart Failure questions reviewed.  Pt asymptomatic.  He has echo scheduled today.    Optivol thoracic impedance normal.   Prescribed:   Furosemide 80 mg take 1 tablet daily.  Metolazone 2.5 mg Take 1 tablet by mouth once weekly if you gain 3-4 pounds in a day.  Taking Differently per script notes: Take 2.5 mg by mouth once a week.   Klor Con 10 mEq Take 10 mEq by mouth 2 (two) times daily.   Recommendations:  No changes and encouraged to call if experiencing any fluid symptoms.  Follow-up plan: ICM clinic phone appointment on  10/08/2019.   91 day device clinic remote transmission 12/05/2019.   Copy of ICM check sent to Dr. Curt Bears.   3 month ICM trend: 09/05/2019    1 Year ICM trend:       Rosalene Billings, RN 09/10/2019 12:42 PM

## 2019-09-18 ENCOUNTER — Other Ambulatory Visit: Payer: Self-pay

## 2019-09-18 ENCOUNTER — Ambulatory Visit (HOSPITAL_COMMUNITY)
Admission: RE | Admit: 2019-09-18 | Discharge: 2019-09-18 | Disposition: A | Discharge status: Home / Self Care | Payer: Medicare Other | Source: Ambulatory Visit | Attending: Nephrology | Admitting: Nephrology

## 2019-09-18 ENCOUNTER — Encounter (HOSPITAL_COMMUNITY): Payer: Self-pay

## 2019-09-18 ENCOUNTER — Emergency Department (HOSPITAL_COMMUNITY)
Admission: EM | Admit: 2019-09-18 | Discharge: 2019-09-19 | Disposition: A | Discharge status: Home / Self Care | Payer: Medicare Other | Attending: Emergency Medicine | Admitting: Emergency Medicine

## 2019-09-18 VITALS — BP 108/53 | HR 73 | Temp 97.1°F | Resp 18

## 2019-09-18 DIAGNOSIS — Z794 Long term (current) use of insulin: Secondary | ICD-10-CM | POA: Insufficient documentation

## 2019-09-18 DIAGNOSIS — I132 Hypertensive heart and chronic kidney disease with heart failure and with stage 5 chronic kidney disease, or end stage renal disease: Secondary | ICD-10-CM | POA: Diagnosis not present

## 2019-09-18 DIAGNOSIS — N186 End stage renal disease: Secondary | ICD-10-CM | POA: Diagnosis not present

## 2019-09-18 DIAGNOSIS — I5022 Chronic systolic (congestive) heart failure: Secondary | ICD-10-CM | POA: Diagnosis not present

## 2019-09-18 DIAGNOSIS — R195 Other fecal abnormalities: Secondary | ICD-10-CM

## 2019-09-18 DIAGNOSIS — Z79899 Other long term (current) drug therapy: Secondary | ICD-10-CM | POA: Insufficient documentation

## 2019-09-18 DIAGNOSIS — I2510 Atherosclerotic heart disease of native coronary artery without angina pectoris: Secondary | ICD-10-CM | POA: Insufficient documentation

## 2019-09-18 DIAGNOSIS — Z87891 Personal history of nicotine dependence: Secondary | ICD-10-CM | POA: Diagnosis not present

## 2019-09-18 DIAGNOSIS — Z8673 Personal history of transient ischemic attack (TIA), and cerebral infarction without residual deficits: Secondary | ICD-10-CM | POA: Insufficient documentation

## 2019-09-18 DIAGNOSIS — N184 Chronic kidney disease, stage 4 (severe): Secondary | ICD-10-CM

## 2019-09-18 DIAGNOSIS — Z7982 Long term (current) use of aspirin: Secondary | ICD-10-CM | POA: Diagnosis not present

## 2019-09-18 DIAGNOSIS — Z992 Dependence on renal dialysis: Secondary | ICD-10-CM | POA: Diagnosis not present

## 2019-09-18 DIAGNOSIS — Z950 Presence of cardiac pacemaker: Secondary | ICD-10-CM | POA: Insufficient documentation

## 2019-09-18 DIAGNOSIS — E1122 Type 2 diabetes mellitus with diabetic chronic kidney disease: Secondary | ICD-10-CM | POA: Insufficient documentation

## 2019-09-18 DIAGNOSIS — D631 Anemia in chronic kidney disease: Secondary | ICD-10-CM | POA: Diagnosis not present

## 2019-09-18 LAB — COMPREHENSIVE METABOLIC PANEL
ALT: 21 U/L (ref 0–44)
AST: 17 U/L (ref 15–41)
Albumin: 3.7 g/dL (ref 3.5–5.0)
Alkaline Phosphatase: 69 U/L (ref 38–126)
Anion gap: 18 — ABNORMAL HIGH (ref 5–15)
BUN: 196 mg/dL — ABNORMAL HIGH (ref 8–23)
CO2: 18 mmol/L — ABNORMAL LOW (ref 22–32)
Calcium: 9.4 mg/dL (ref 8.9–10.3)
Chloride: 104 mmol/L (ref 98–111)
Creatinine, Ser: 6.03 mg/dL — ABNORMAL HIGH (ref 0.61–1.24)
GFR calc Af Amer: 10 mL/min — ABNORMAL LOW (ref >60)
GFR calc non Af Amer: 9 mL/min — ABNORMAL LOW (ref >60)
Glucose, Bld: 116 mg/dL — ABNORMAL HIGH (ref 70–99)
Potassium: 4.4 mmol/L (ref 3.5–5.1)
Sodium: 140 mmol/L (ref 135–145)
Total Bilirubin: 0.3 mg/dL (ref 0.3–1.2)
Total Protein: 7.4 g/dL (ref 6.5–8.1)

## 2019-09-18 LAB — CBC WITH DIFFERENTIAL/PLATELET
Abs Immature Granulocytes: 0.01 10*3/uL (ref 0.00–0.07)
Abs Immature Granulocytes: 0.02 10*3/uL (ref 0.00–0.07)
Basophils Absolute: 0 10*3/uL (ref 0.0–0.1)
Basophils Absolute: 0 10*3/uL (ref 0.0–0.1)
Basophils Relative: 0 %
Basophils Relative: 0 %
Eosinophils Absolute: 0.2 10*3/uL (ref 0.0–0.5)
Eosinophils Absolute: 0.2 10*3/uL (ref 0.0–0.5)
Eosinophils Relative: 2 %
Eosinophils Relative: 2 %
HCT: 25.2 % — ABNORMAL LOW (ref 39.0–52.0)
HCT: 26.1 % — ABNORMAL LOW (ref 39.0–52.0)
Hemoglobin: 7.9 g/dL — ABNORMAL LOW (ref 13.0–17.0)
Hemoglobin: 8 g/dL — ABNORMAL LOW (ref 13.0–17.0)
Immature Granulocytes: 0 %
Immature Granulocytes: 0 %
Lymphocytes Relative: 27 %
Lymphocytes Relative: 28 %
Lymphs Abs: 2.1 10*3/uL (ref 0.7–4.0)
Lymphs Abs: 2.1 10*3/uL (ref 0.7–4.0)
MCH: 28.2 pg (ref 26.0–34.0)
MCH: 27.7 pg (ref 26.0–34.0)
MCHC: 31.3 g/dL (ref 30.0–36.0)
MCHC: 30.7 g/dL (ref 30.0–36.0)
MCV: 90 fL (ref 80.0–100.0)
MCV: 90.3 fL (ref 80.0–100.0)
Monocytes Absolute: 0.7 10*3/uL (ref 0.1–1.0)
Monocytes Absolute: 0.7 10*3/uL (ref 0.1–1.0)
Monocytes Relative: 9 %
Monocytes Relative: 9 %
Neutro Abs: 4.8 10*3/uL (ref 1.7–7.7)
Neutro Abs: 4.7 10*3/uL (ref 1.7–7.7)
Neutrophils Relative %: 62 %
Neutrophils Relative %: 61 %
Platelets: 130 10*3/uL — ABNORMAL LOW (ref 150–400)
Platelets: 135 10*3/uL — ABNORMAL LOW (ref 150–400)
RBC: 2.8 MIL/uL — ABNORMAL LOW (ref 4.22–5.81)
RBC: 2.89 MIL/uL — ABNORMAL LOW (ref 4.22–5.81)
RDW: 16.7 % — ABNORMAL HIGH (ref 11.5–15.5)
RDW: 16.6 % — ABNORMAL HIGH (ref 11.5–15.5)
WBC: 7.8 10*3/uL (ref 4.0–10.5)
WBC: 7.8 10*3/uL (ref 4.0–10.5)
nRBC: 0 % (ref 0.0–0.2)
nRBC: 0 % (ref 0.0–0.2)

## 2019-09-18 LAB — POCT HEMOGLOBIN-HEMACUE: Hemoglobin: 7.7 g/dL — ABNORMAL LOW (ref 13.0–17.0)

## 2019-09-18 MED ORDER — DARBEPOETIN ALFA 40 MCG/0.4ML IJ SOSY: 40.0000 ug | PREFILLED_SYRINGE | INTRAMUSCULAR | Status: DC

## 2019-09-18 MED ORDER — DARBEPOETIN ALFA 40 MCG/0.4ML IJ SOSY
PREFILLED_SYRINGE | INTRAMUSCULAR | Status: AC
  Administered 2019-09-18: 40 ug via SUBCUTANEOUS
  Filled 2019-09-18: qty 0.4

## 2019-09-18 NOTE — ED Triage Notes (Signed)
Pt went in for routine iron shot, reports that hemoglobin was 7.7. Had bloody diarrhea a few weeks ago, none since. Came here for assessment.

## 2019-09-18 NOTE — Discharge Instructions (Signed)
Your evaluation tonight has been largely reassuring.  Your hemoglobin value was 8.  Please monitor your condition carefully do not hesitate to return here for concerning changes.

## 2019-09-18 NOTE — ED Provider Notes (Signed)
[facility] EMERGENCY DEPARTMENT Provider Note   CSN: [id number] Arrival date & time: 09/18/19  1337     History Chief Complaint  Patient presents with  . Abnormal Lab    Matthew Wood. is a 69 y.o. male.  HPI   Patient presents concern of possible anemia.  Patient with multiple medical issues including end-stage renal disease, is being prepped for dialysis, has a fistula in place.  He is also receiving monthly therapy to augment his red blood cells. Today, after speaking with staff, relating to them that he had an episode 2 weeks ago of loose stool, and with some concern for ongoing bleeding he was sent here for evaluation.  Per EMR review, from the short stay medical staff the patient had a hemoglobin value of 7.9. When asked about how he is doing now, the patient states that he feels fine, has no abdominal pain, chest pain, no dyspnea, no fever, no chills.  He has had no loose bowel movements in about 2 weeks.  Past Medical History:  Diagnosis Date  . Anemia   . CHF (congestive heart failure) ([place])   . Coronary artery disease    a. LHC 04/27/10 left main patent, 30-40% LAD, mid circumflex 30%, 80% mid RCA accounting for the appearance of an infract/peri-infract ischemia on nuclear testing, LV EF of 30-45%--> medical therapy  . CVA (cerebrovascular accident) ([place]) 2011   potine; "little balance problems since" (05/25/2016)  . Dyspnea   . Gout   . History of kidney stones    "passed them" (05/25/2016)  . Hyperlipidemia   . Hypertension   . LBBB (left bundle branch block)   . Multinodular goiter   . Obesity   . OSA on CPAP   . Presence of permanent cardiac pacemaker   . Renal insufficiency   . Type II diabetes mellitus [facility])     Patient Active Problem List   Diagnosis Date Noted  . CKD (chronic kidney disease), stage V ([place]) 03/27/2019  . Arthritis of right knee 10/05/2017  . Hematuria 10/05/2017  . Type 2 diabetes mellitus with nephropathy  ([place]) 10/05/2017  . Acute respiratory failure ([place]) 10/04/2017  . Right upper lobe pneumonia 10/04/2017  . Cardiorenal syndrome with renal failure 10/04/2017  . Elevated troponin 10/04/2017  . CKD (chronic kidney disease), stage IV ([place]) 07/20/2016  . Chronic systolic heart failure ([place]) 02/21/2016  . Carotid aneurysm, left ([place]) 11/15/2013  . CAD (coronary artery disease), native coronary artery 11/15/2013  . Left pontine CVA ([place]) 11/15/2013  . Essential hypertension 11/15/2013  . OSA (obstructive sleep apnea) 11/15/2013  . Hyperlipidemia 11/15/2013  . Erectile dysfunction 11/15/2013    Past Surgical History:  Procedure Laterality Date  . AV FISTULA PLACEMENT Right 05/25/2019   Procedure: ARTERIOVENOUS (AV) FISTULA CREATION RIGHT ARM;  Surgeon: Marty Heck, MD;  Location: [facility];  Service: Vascular;  Laterality: Right;  . COLONOSCOPY WITH PROPOFOL N/A 04/16/2014   Procedure: COLONOSCOPY WITH PROPOFOL;  Surgeon: [name], MD;  Location: [facility] ENDOSCOPY;  Service: Endoscopy;  Laterality: N/A;  . EP IMPLANTABLE DEVICE N/A 05/25/2016   Procedure: BiV ICD Insertion CRT-D;  Surgeon: [name], MD;  Location: [facility] CV LAB;  Service: Cardiovascular;  Laterality: N/A;  . INSERT / REPLACE / REMOVE PACEMAKER  05/25/2016   biventricular pacemaker  . LAPAROSCOPIC CHOLECYSTECTOMY         Family History  Problem Relation Age of Onset  . Hypertension Mother   .  Breast cancer Mother   . Heart attack Father   . Stroke Father   . Diabetes type II Father   . Hypertension Sister     Social History   Tobacco Use  . Smoking status: Former Smoker    Packs/day: 0.50    Years: 27.00    Pack years: 13.50    Types: Cigarettes    Quit date: 03/25/2010    Years since quitting: 9.4  . Smokeless tobacco: Never Used  Substance Use Topics  . Alcohol use: Not Currently    Comment: 05/25/2016 "was an occasional drinker; maybe 4 drinks/week; stopped after stroke in  2011"  . Drug use: No    Home Medications Prior to Admission medications   Medication Sig Start Date End Date Taking? Authorizing Provider  allopurinol (ZYLOPRIM) 300 MG tablet Take 300 mg by mouth daily.    [provider]  amLODipine-benazepril (LOTREL) 10-40 MG per capsule Take 1 capsule by mouth every morning.     [provider]  aspirin EC 81 MG tablet Take 81 mg by mouth at bedtime.    [provider]  carvedilol (COREG) 25 MG tablet Take 25 mg by mouth 2 (two) times daily with a meal.    [provider]  cloNIDine (CATAPRES) 0.2 MG tablet Take 0.1-0.2 mg by mouth See admin instructions. Take 0.1 mg by mouth in the morning and 0.2 mg in the evening    [provider]  furosemide (LASIX) 80 MG tablet Take 80 mg by mouth 2 (two) times daily.     [provider]  HUMALOG KWIKPEN 100 UNIT/ML KiwkPen Inject 14 Units into the skin daily after breakfast.  09/19/17   [provider]  insulin glargine (LANTUS) 100 UNIT/ML injection Inject 44 Units into the skin at bedtime.     [provider]  isosorbide-hydrALAZINE (BIDIL) 20-37.5 MG tablet Take 1 tablet by mouth 2 (two) times daily. 10/07/17   Emokpae, Courage, MD  KLOR-CON M10 10 MEQ tablet TAKE 1 TABLET (10 MEQ TOTAL) BY MOUTH 2 (TWO) TIMES DAILY. 02/13/19   Belva Crome, MD  metolazone (ZAROXOLYN) 2.5 MG tablet Take 1 tablet by mouth once weekly if you gain 3-4 pounds in a day 10/07/17   Roxan Hockey, MD  NON FORMULARY CPAP: At bedtime    [provider]  rosuvastatin (CRESTOR) 20 MG tablet Take 20 mg by mouth daily.    [provider]    Allergies    Patient has no known allergies.  Review of Systems   Review of Systems  Constitutional:       Per HPI, otherwise negative  HENT:       Per HPI, otherwise negative  Respiratory:       Per HPI, otherwise negative  Cardiovascular:       Per HPI, otherwise negative  Gastrointestinal: Negative for  vomiting.       Per HPI otherwise unremarkable  Endocrine:       Negative aside from HPI  Genitourinary:       Neg aside from HPI   Musculoskeletal:       Per HPI, otherwise negative  Skin: Negative.   Allergic/Immunologic: Positive for immunocompromised state.  Neurological: Negative for syncope and weakness.    Physical Exam Updated Vital Signs BP 117/63 (BP Location: Left Arm)   Pulse 92   Temp 97.7 F (36.5 C)   Resp 16   Ht 6' (1.829 m)   Wt 131.5 kg  SpO2 100%   BMI 39.33 kg/m   Physical Exam Vitals and nursing note reviewed.  Constitutional:      General: He is not in acute distress.    Appearance: He is well-developed.  HENT:     Head: Normocephalic and atraumatic.  Eyes:     Conjunctiva/sclera: Conjunctivae normal.  Cardiovascular:     Rate and Rhythm: Normal rate and regular rhythm.  Pulmonary:     Effort: Pulmonary effort is normal. No respiratory distress.     Breath sounds: No stridor.  Abdominal:     General: There is no distension.     Tenderness: There is no abdominal tenderness. There is no guarding.  Skin:    General: Skin is warm and dry.  Neurological:     Mental Status: He is alert and oriented to person, place, and time.     ED Results / Procedures / Treatments   Labs (all labs ordered are listed, but only abnormal results are displayed) Labs Reviewed  CBC WITH DIFFERENTIAL/PLATELET - Abnormal; Notable for the following components:      Result Value   RBC 2.89 (*)    Hemoglobin 8.0 (*)    HCT 26.1 (*)    RDW 16.6 (*)    Platelets 135 (*)    All other components within normal limits  COMPREHENSIVE METABOLIC PANEL - Abnormal; Notable for the following components:   CO2 18 (*)    Glucose, Bld 116 (*)    BUN 196 (*)    Creatinine, Ser 6.03 (*)    GFR calc non Af Amer 9 (*)    GFR calc Af Amer 10 (*)    Anion gap 18 (*)    All other components within normal limits     Procedures Procedures (including critical care  time)  Medications Ordered in ED Medications - No data to display  ED Course  I have reviewed the triage vital signs and the nursing notes.  Pertinent labs & imaging results that were available during my care of the patient were reviewed by me and considered in my medical decision making (see chart for details).  Labs reviewed, EMR reviewed, as above.  Patient presents with concern of possible anemia after an episode of loose stool, possibly bloody 2 weeks ago.  Here he is awake, alert, hemodynamically unremarkable, labs are for him consistent, though with notation of his worsening renal disease.  Given absence of hemodynamic instability, substantial drop in hemoglobin, patient is appropriate for close outpatient follow-up. Absent abdominal tenderness, fever, leukocytosis, little suspicion for other occult pathology including colitis, diverticulitis.  Final Clinical Impression(s) / ED Diagnoses Final diagnoses:  Loose stools  Anemia due to end stage renal disease ([place])     Carmin Muskrat, MD 09/18/19 2213

## 2019-09-18 NOTE — Progress Notes (Signed)
Pt here for Aranesp injection.  HGB 7.9 via hemocue, repeated w/ 7.7 result.  Pt says he has been SOB and had bloody diarrhea 2 weeks ago.  Denies chest pain.  Dr Marval Regal notified.  Proceed with injection and take pt to ER for evaluation.  Pt transported to ED at 1:35 pm without incident.  Wife notified

## 2019-09-19 NOTE — ED Notes (Signed)
Patient verbalizes understanding of discharge instructions. Opportunity for questioning and answers were provided. Armband removed by staff, pt discharged from ED by wheelchair with wife to pick up pt

## 2019-09-29 DIAGNOSIS — N189 Chronic kidney disease, unspecified: Secondary | ICD-10-CM

## 2019-09-29 DIAGNOSIS — N2581 Secondary hyperparathyroidism of renal origin: Secondary | ICD-10-CM | POA: Insufficient documentation

## 2019-09-29 DIAGNOSIS — N186 End stage renal disease: Secondary | ICD-10-CM

## 2019-09-29 DIAGNOSIS — D509 Iron deficiency anemia, unspecified: Secondary | ICD-10-CM

## 2019-09-29 DIAGNOSIS — Z111 Encounter for screening for respiratory tuberculosis: Secondary | ICD-10-CM | POA: Insufficient documentation

## 2019-09-29 DIAGNOSIS — D631 Anemia in chronic kidney disease: Secondary | ICD-10-CM

## 2019-09-29 DIAGNOSIS — D689 Coagulation defect, unspecified: Secondary | ICD-10-CM | POA: Insufficient documentation

## 2019-09-29 DIAGNOSIS — R0602 Shortness of breath: Secondary | ICD-10-CM | POA: Insufficient documentation

## 2019-09-29 DIAGNOSIS — L299 Pruritus, unspecified: Secondary | ICD-10-CM | POA: Insufficient documentation

## 2019-09-29 HISTORY — DX: Anemia in chronic kidney disease: D63.1

## 2019-09-29 HISTORY — DX: Chronic kidney disease, unspecified: N18.9

## 2019-09-29 HISTORY — DX: Iron deficiency anemia, unspecified: D50.9

## 2019-09-29 HISTORY — DX: Coagulation defect, unspecified: D68.9

## 2019-09-29 HISTORY — DX: Secondary hyperparathyroidism of renal origin: N25.81

## 2019-09-29 HISTORY — DX: End stage renal disease: N18.6

## 2019-10-04 DIAGNOSIS — M109 Gout, unspecified: Secondary | ICD-10-CM

## 2019-10-04 DIAGNOSIS — I7090 Unspecified atherosclerosis: Secondary | ICD-10-CM | POA: Insufficient documentation

## 2019-10-04 DIAGNOSIS — I6930 Unspecified sequelae of cerebral infarction: Secondary | ICD-10-CM | POA: Insufficient documentation

## 2019-10-04 HISTORY — DX: Unspecified sequelae of cerebral infarction: I69.30

## 2019-10-04 HISTORY — DX: Gout, unspecified: M10.9

## 2019-10-08 ENCOUNTER — Ambulatory Visit (INDEPENDENT_AMBULATORY_CARE_PROVIDER_SITE_OTHER): Payer: Medicare Other

## 2019-10-08 DIAGNOSIS — I5022 Chronic systolic (congestive) heart failure: Secondary | ICD-10-CM | POA: Diagnosis not present

## 2019-10-08 DIAGNOSIS — Z95810 Presence of automatic (implantable) cardiac defibrillator: Secondary | ICD-10-CM | POA: Diagnosis not present

## 2019-10-10 NOTE — Progress Notes (Signed)
EPIC Encounter for ICM Monitoring  Patient Name: Matthew Wood. is a 69 y.o. male Date: 10/10/2019 Primary Care Physican: Seward Carol, MD Primary Cardiologist:Smith Electrophysiologist:[place] Bi-V Pacing:88.1% 4/28/2021Weight:269lbs   Heart Failure questions reviewed. Patient started dialysis last week and this is permanent.  He is tolerating it well.        Advised will check fluid levels next month and then after that will return to every 3 months since dialysis is now managing his fluid  Optivol thoracic impedance normal.  Prescribed:   Furosemide80 mg take 1 tablet daily.  Metolazone 2.5 mgTake 1 tablet by mouth once weekly if you gain 3-4 pounds in a day. Taking Differently per script notes: Take 2.5 mg by mouth once a week.  Klor Con 10 mEqTake 10 mEq by mouth 2 (two) times daily.  Labs: 09/18/2019 Creatinine 6.03, BUN 196, Potassium 4.4, Sodium 140, GFR 9-10 08/21/2019 Creatinine 5.87, BUN 150, Potassium 3.9, Sodium 142, GFR 9-11  06/26/2019 Creatinine 5.13, BUN 114, Potassium 4.0, Sodium 139, GFR 11-13  A complete set of results can be found in Results Review.  Recommendations:No changes and encouraged to call if experiencing any fluid symptoms.  Follow-up plan: ICM clinic phone appointment on 11/13/2019. 91 day device clinic remote transmission 12/05/2019.   Copy of ICM check sent to [facility].   3 month ICM trend: 10/08/2019    1 Year ICM trend:       Rosalene Billings, RN 10/10/2019 10:33 AM

## 2019-10-12 ENCOUNTER — Telehealth: Payer: Self-pay | Admitting: *Deleted

## 2019-10-12 NOTE — Telephone Encounter (Signed)
YOUR CARDIOLOGY TEAM HAS ARRANGED FOR AN E-VISIT FOR YOUR APPOINTMENT - PLEASE REVIEW IMPORTANT INFORMATION BELOW SEVERAL DAYS PRIOR TO YOUR APPOINTMENT  Due to the recent COVID-19 pandemic, we are transitioning in-person office visits to tele-medicine visits in an effort to decrease unnecessary exposure to our patients, their families, and staff. These visits are billed to your insurance just like a normal visit is. We also encourage you to sign up for MyChart if you have not already done so. You will need a smartphone if possible. For patients that do not have this, we can still complete the visit using a regular telephone but do prefer a smartphone to enable video when possible. You may have a family member that lives with you that can help. If possible, we also ask that you have a blood pressure cuff and scale at home to measure your blood pressure, heart rate and weight prior to your scheduled appointment. Patients with clinical needs that need an in-person evaluation and testing will still be able to come to the office if absolutely necessary. If you have any questions, feel free to call our office.     YOUR PROVIDER WILL BE USING THE FOLLOWING PLATFORM TO COMPLETE YOUR VISIT: Staff: Please delete this text and fill in [email or web address]/[email or web address]  . IF USING MYCHART - How to Download the MyChart App to Your SmartPhone   - If Apple, go to App Store and type in MyChart in the search bar and download the app. If Android, ask patient to go to Google Play Store and type in MyChart in the search bar and download the app. The app is free but as with any other app downloads, your phone may require you to verify saved payment information or Apple/Android password.  - You will need to then log into the app with your MyChart username and password, and select [facility] as your healthcare provider to link the account.  - When it is time for your visit, go to the MyChart app, find appointments, and click Begin  Video Visit. Be sure to Select Allow for your device to access the Microphone and Camera for your visit. You will then be connected, and your provider will be with you shortly.  **If you have any issues connecting or need assistance, please contact MyChart service desk [phone] ([phone])**  **If using a computer, in order to ensure the best quality for your visit, you will need to use either of the following Internet Browsers: Google Chrome or Microsoft Edge**  . IF USING DOXIMITY or [email or web address] - The staff will give you instructions on receiving your link to join the meeting the day of your visit.      THE DAY OF YOUR APPOINTMENT  Approximately 15 minutes prior to your scheduled appointment, you will receive a telephone call from one of [facility] team - your caller ID may say "Unknown caller."  Our staff will confirm medications, vital signs for the day and any symptoms you may be experiencing. Please have this information available prior to the time of visit start. It may also be helpful for you to have a pad of paper and pen handy for any instructions given during your visit. They will also walk you through joining the smartphone meeting if this is a video visit.    CONSENT FOR TELE-HEALTH VISIT - PLEASE REVIEW  I hereby voluntarily request, consent and authorize [facility] and its employed or contracted physicians, physician assistants, nurse practitioners or other licensed health care professionals (the   Practitioner), to provide me with telemedicine health care services (the "Services") as deemed necessary by the treating Practitioner. I acknowledge and consent to receive the Services by the Practitioner via telemedicine. I understand that the telemedicine visit will involve communicating with the Practitioner through live audiovisual communication technology and the disclosure of certain medical information by electronic transmission. I acknowledge that I have been given the  opportunity to request an in-person assessment or other available alternative prior to the telemedicine visit and am voluntarily participating in the telemedicine visit.  I understand that I have the right to withhold or withdraw my consent to the use of telemedicine in the course of my care at any time, without affecting my right to future care or treatment, and that the Practitioner or I may terminate the telemedicine visit at any time. I understand that I have the right to inspect all information obtained and/or recorded in the course of the telemedicine visit and may receive copies of available information for a reasonable fee.  I understand that some of the potential risks of receiving the Services via telemedicine include:  . Delay or interruption in medical evaluation due to technological equipment failure or disruption; . Information transmitted may not be sufficient (e.g. poor resolution of images) to allow for appropriate medical decision making by the Practitioner; and/or  . In rare instances, security protocols could fail, causing a breach of personal health information.  Furthermore, I acknowledge that it is my responsibility to provide information about my medical history, conditions and care that is complete and accurate to the best of my ability. I acknowledge that Practitioner's advice, recommendations, and/or decision may be based on factors not within their control, such as incomplete or inaccurate data provided by me or distortions of diagnostic images or specimens that may result from electronic transmissions. I understand that the practice of medicine is not an exact science and that Practitioner makes no warranties or guarantees regarding treatment outcomes. I acknowledge that I will receive a copy of this consent concurrently upon execution via email to the email address I last provided but may also request a printed copy by calling the office of [facility].    I understand that  my insurance will be billed for this visit.   I have read or had this consent read to me. . I understand the contents of this consent, which adequately explains the benefits and risks of the Services being provided via telemedicine.  . I have been provided ample opportunity to ask questions regarding this consent and the Services and have had my questions answered to my satisfaction. . I give my informed consent for the services to be provided through the use of telemedicine in my medical care  By participating in this telemedicine visit I agree to the above. 

## 2019-10-16 ENCOUNTER — Encounter (HOSPITAL_COMMUNITY): Payer: Medicare Other

## 2019-10-26 ENCOUNTER — Telehealth: Payer: Medicare Other | Admitting: Cardiology

## 2019-10-31 ENCOUNTER — Telehealth: Payer: Self-pay | Admitting: *Deleted

## 2019-10-31 NOTE — Telephone Encounter (Signed)
Patient's download shows an AHI of 33.3. I reached out to patient to ask if he thought his mask was leaking or if the seal breaks while sleeping and he states no it does not. Patient seemed to get annoyed with my questions I apologized for bothering him and ended the call.

## 2019-11-04 NOTE — Progress Notes (Signed)
Virtual Visit via Telephone Note   This visit type was conducted due to national recommendations for restrictions regarding the COVID-19 Pandemic (e.g. social distancing) in an effort to limit this patient's exposure and mitigate transmission in our community.  Due to his co-morbid illnesses, this patient is at least at moderate risk for complications without adequate follow up.  This format is felt to be most appropriate for this patient at this time.  All issues noted in this document were discussed and addressed.  A limited physical exam was performed with this format.  Please refer to the patient's chart for his consent to telehealth for [facility].   Evaluation Performed:  Follow-up visit  This visit type was conducted due to national recommendations for restrictions regarding the COVID-19 Pandemic (e.g. social distancing).  This format is felt to be most appropriate for this patient at this time.  All issues noted in this document were discussed and addressed.  No physical exam was performed (except for noted visual exam findings with Video Visits).  Please refer to the patient's chart (MyChart message for video visits and phone note for telephone visits) for the patient's consent to telehealth for [facility].  Date:  11/05/2019   ID:  Matthew Filbert., DOB Nov 06, 1950, MRN [id number]  Patient Location:  Home  Provider location:   [place]  PCP:  Seward Carol, MD  Cardiologist:  Sinclair Grooms, MD  Sleep Medicine:  Fransico Him, MD Electrophysiologist:  None   Chief Complaint:  OSA  History of Present Illness:    Matthew Darden. is a 70 y.o. male who presents via audio/video conferencing for a telehealth visit today.    This is a 69yo male with a hx of CAD, CVA, HTN, LBBB and OSA on CPAP.  His device was over 55 years old and and he was in need of a new device. He was referred for sleep study by Dr. Tamala Julian which showed moderate OSA with an AHI of 28/hr and O2  sats as low as 89%.  He underwent CPAP titration to 15cm H2O and is now on CPAP.    He is doing well with his CPAP device and thinks that he has gotten used to it.  He was using a nasal pillow mask but not like it and is using a nasal mask.  He says that that mask is very old. He tolerates the mask and feels the pressure is adequate.  Since going on CPAP he feels rested in the am and has no significant daytime sleepiness.  He has significant mouth dryness in the am.   He does not know if he is snoring.  He sleeps in a different room from his wife in a recliner.  The patient does not have symptoms concerning for COVID-19 infection (fever, chills, cough, or new shortness of breath).   Prior CV studies:   The following studies were reviewed today:  Sleep study, CPAP titration, PAP compliance download  Past Medical History:  Diagnosis Date  . Anemia   . CHF (congestive heart failure) ([place])   . Coronary artery disease    a. LHC 04/27/10 left main patent, 30-40% LAD, mid circumflex 30%, 80% mid RCA accounting for the appearance of an infract/peri-infract ischemia on nuclear testing, LV EF of 30-45%--> medical therapy  . CVA (cerebrovascular accident) ([place]) 2011   potine; "little balance problems since" (05/25/2016)  . Dyspnea   . Gout   . History of kidney stones    "  passed them" (05/25/2016)  . Hyperlipidemia   . Hypertension   . LBBB (left bundle branch block)   . Multinodular goiter   . Obesity   . OSA on CPAP   . Presence of permanent cardiac pacemaker   . Renal insufficiency   . Type II diabetes mellitus ([place])    Past Surgical History:  Procedure Laterality Date  . AV FISTULA PLACEMENT Right 05/25/2019   Procedure: ARTERIOVENOUS (AV) FISTULA CREATION RIGHT ARM;  Surgeon: Marty Heck, MD;  Location: [place];  Service: Vascular;  Laterality: Right;  . COLONOSCOPY WITH PROPOFOL N/A 04/16/2014   Procedure: COLONOSCOPY WITH PROPOFOL;  Surgeon: [name], MD;  Location: [facility]  ENDOSCOPY;  Service: Endoscopy;  Laterality: N/A;  . EP IMPLANTABLE DEVICE N/A 05/25/2016   Procedure: BiV ICD Insertion CRT-D;  Surgeon: [name], MD;  Location: [place] CV LAB;  Service: Cardiovascular;  Laterality: N/A;  . INSERT / REPLACE / REMOVE PACEMAKER  05/25/2016   biventricular pacemaker  . LAPAROSCOPIC CHOLECYSTECTOMY       Current Meds  Medication Sig  . allopurinol (ZYLOPRIM) 300 MG tablet Take 300 mg by mouth daily.  Marland Kitchen amLODipine-benazepril (LOTREL) 10-40 MG per capsule Take 1 capsule by mouth every morning.   Marland Kitchen aspirin EC 81 MG tablet Take 81 mg by mouth at bedtime.  . carvedilol (COREG) 25 MG tablet Take 25 mg by mouth 2 (two) times daily with a meal. Takes only 1 tablet on HD days  . furosemide (LASIX) 80 MG tablet Take 80 mg by mouth 2 (two) times daily.   Marland Kitchen HUMALOG KWIKPEN 100 UNIT/ML KiwkPen Inject 14 Units into the skin daily after breakfast.   . insulin glargine (LANTUS) 100 UNIT/ML injection Inject 44 Units into the skin at bedtime.   . multivitamin (RENA-VIT) TABS tablet Take 1 tablet by mouth daily.  . NON FORMULARY CPAP: At bedtime  . rosuvastatin (CRESTOR) 20 MG tablet Take 20 mg by mouth daily.  . sevelamer carbonate (RENVELA) 800 MG tablet Take 1,600 mg by mouth 3 (three) times daily with meals. Also takes 1 tablet before snack  . [DISCONTINUED] cloNIDine (CATAPRES) 0.2 MG tablet Take 0.1-0.2 mg by mouth See admin instructions. Take 0.1 mg by mouth in the morning and 0.2 mg in the evening  . [DISCONTINUED] isosorbide-hydrALAZINE (BIDIL) 20-37.5 MG tablet Take 1 tablet by mouth 2 (two) times daily.  . [DISCONTINUED] KLOR-CON M10 10 MEQ tablet TAKE 1 TABLET (10 MEQ TOTAL) BY MOUTH 2 (TWO) TIMES DAILY.  . [DISCONTINUED] metolazone (ZAROXOLYN) 2.5 MG tablet Take 1 tablet by mouth once weekly if you gain 3-4 pounds in a day     Allergies:   Patient has no known allergies.   Social History   Tobacco Use  . Smoking status: Former Smoker     Packs/day: 0.50    Years: 27.00    Pack years: 13.50    Types: Cigarettes    Quit date: 03/25/2010    Years since quitting: 9.6  . Smokeless tobacco: Never Used  Substance Use Topics  . Alcohol use: Not Currently    Comment: 05/25/2016 "was an occasional drinker; maybe 4 drinks/week; stopped after stroke in 2011"  . Drug use: No     Family Hx: The patient's family history includes Breast cancer in his mother; Diabetes type II in his father; Heart attack in his father; Hypertension in his mother and sister; Stroke in his father.  ROS:   Please see the history of present  illness.     All other systems reviewed and are negative.   Labs/Other Tests and Data Reviewed:    Recent Labs: 09/18/2019: ALT 21; BUN 196; Creatinine, Ser 6.03; Hemoglobin 8.0; Platelets 135; Potassium 4.4; Sodium 140   Recent Lipid Panel Lab Results  Component Value Date/Time   CHOL  02/08/2010 06:00 AM    171        ATP III CLASSIFICATION:  <200     mg/dL   Desirable  200-239  mg/dL   Borderline High  >=240    mg/dL   High          TRIG 248 (H) 02/08/2010 06:00 AM   HDL 34 (L) 02/08/2010 06:00 AM   CHOLHDL 5.0 02/08/2010 06:00 AM   LDLCALC  02/08/2010 06:00 AM    87        Total Cholesterol/HDL:CHD Risk Coronary Heart Disease Risk Table                     Men   Women  1/2 Average Risk   3.4   3.3  Average Risk       5.0   4.4  2 X Average Risk   9.6   7.1  3 X Average Risk  23.4   11.0        Use the calculated Patient Ratio above and the CHD Risk Table to determine the patient's CHD Risk.        ATP III CLASSIFICATION (LDL):  <100     mg/dL   Optimal  100-129  mg/dL   Near or Above                    Optimal  130-159  mg/dL   Borderline  160-189  mg/dL   High  >190     mg/dL   Very High    Wt Readings from Last 3 Encounters:  11/05/19 265 lb (120.2 kg)  09/18/19 290 lb (131.5 kg)  08/22/19 284 lb 3.2 oz (128.9 kg)     Objective:    Vital Signs:  Ht 6' (1.829 m)   Wt 265 lb  (120.2 kg)   BMI 35.94 kg/m     ASSESSMENT & PLAN:    1.  OSA - The patient is tolerating PAP therapy well without any problems. The PAP download was reviewed today and showed an AHI of 39/hr on 15 cm H2O with 83% compliance in using more than 4 hours nightly.  The patient has been using and benefiting from PAP use and will continue to benefit from therapy.  -his AHI is too high and download shows a large mask leak -I suspect that he is mouth breathing some.   -his mask is very old and needs a new one -I will order him a new PAP mask and get a new ResMed full face mask -repeat download in 2 weeks  2.  HTN -BP controlled  -continue Amlodipine-Benazepril 10-40mg  daily, Carvedilol 25mg  BID  3.  Obesity -he has a bad knee that limits his walking -I encouraged him to try to use his wife's exercise bike -he follows a very strict diet due to DM and ESRD   Time:   Today, I have spent 20 minutes on telemedicine discussing medical problems including OSA, HTN, Obesity and reviewing patient's chart including sleep study, CPAP titration, PAP compliance download.  Medication Adjustments/Labs and Tests Ordered: Current medicines are reviewed at length with the patient today.  Concerns  regarding medicines are outlined above.  Tests Ordered: No orders of the defined types were placed in this encounter.  Medication Changes: No orders of the defined types were placed in this encounter.   Disposition:  Follow up in 6 weeks  Signed, Fransico Him, MD  11/05/2019 9:15 AM    [place]

## 2019-11-05 ENCOUNTER — Encounter: Payer: Self-pay | Admitting: Cardiology

## 2019-11-05 ENCOUNTER — Telehealth (INDEPENDENT_AMBULATORY_CARE_PROVIDER_SITE_OTHER): Payer: Medicare Other | Admitting: Cardiology

## 2019-11-05 ENCOUNTER — Other Ambulatory Visit: Payer: Self-pay

## 2019-11-05 VITALS — Ht 72.0 in | Wt 265.0 lb

## 2019-11-05 DIAGNOSIS — I10 Essential (primary) hypertension: Secondary | ICD-10-CM

## 2019-11-05 DIAGNOSIS — G4733 Obstructive sleep apnea (adult) (pediatric): Secondary | ICD-10-CM | POA: Diagnosis not present

## 2019-11-05 DIAGNOSIS — E669 Obesity, unspecified: Secondary | ICD-10-CM

## 2019-11-07 ENCOUNTER — Telehealth: Payer: Self-pay | Admitting: *Deleted

## 2019-11-07 DIAGNOSIS — G4733 Obstructive sleep apnea (adult) (pediatric): Secondary | ICD-10-CM

## 2019-11-07 NOTE — Telephone Encounter (Signed)
-----   Message from Sueanne Margarita, MD sent at 11/05/2019  9:20 AM EDT ----- Set up appt with DME in person to get fitted with a new full face mask and then get a download in 2 weeks. Followup with me in 6 weeks

## 2019-11-07 NOTE — Telephone Encounter (Signed)
Order placed to [employer] via community message 

## 2019-11-13 ENCOUNTER — Encounter (HOSPITAL_COMMUNITY): Payer: Medicare Other

## 2019-11-26 ENCOUNTER — Ambulatory Visit (INDEPENDENT_AMBULATORY_CARE_PROVIDER_SITE_OTHER): Payer: Medicare Other

## 2019-11-26 DIAGNOSIS — Z95810 Presence of automatic (implantable) cardiac defibrillator: Secondary | ICD-10-CM

## 2019-11-26 DIAGNOSIS — I5022 Chronic systolic (congestive) heart failure: Secondary | ICD-10-CM

## 2019-11-28 NOTE — Progress Notes (Signed)
EPIC Encounter for ICM Monitoring  Patient Name: Matthew Wood. is a 69 y.o. male Date: 11/28/2019 Primary Care Physican: Seward Carol, MD Primary Cardiologist:Smith Electrophysiologist:[place] Bi-V Pacing:88.1% 4/28/2021Weight:269lbs   Heart Failure questions reviewed. Patient is permanently on dialysis and is managing fluid levels. No further monthly ICM follow up needed since he is on dialysis.         Optivol thoracic impedance normal.  Prescribed:   Furosemide80 mg take 1 tablet daily.  Metolazone 2.5 mgTake 1 tablet by mouth once weekly if you gain 3-4 pounds in a day. Taking Differently per script notes: Take 2.5 mg by mouth once a week.  Klor Con 10 mEqTake 10 mEq by mouth 2 (two) times daily.  Labs: 09/18/2019 Creatinine 6.03, BUN 196, Potassium 4.4, Sodium 140, GFR 9-10 08/21/2019 Creatinine 5.87, BUN 150, Potassium 3.9, Sodium 142, GFR 9-11  06/26/2019 Creatinine 5.13, BUN 114, Potassium 4.0, Sodium 139, GFR 11-13  A complete set of results can be found in Results Review.  Recommendations:None  Follow-up plan: 91 day device clinic remote transmissions will continue per protocol.   Copy of ICM check sent to [facility].  3 month ICM trend: 11/26/2019    1 Year ICM trend:       Rosalene Billings, RN 11/28/2019 12:13 PM

## 2019-12-05 ENCOUNTER — Ambulatory Visit (INDEPENDENT_AMBULATORY_CARE_PROVIDER_SITE_OTHER): Payer: Medicare Other | Admitting: *Deleted

## 2019-12-05 DIAGNOSIS — I429 Cardiomyopathy, unspecified: Secondary | ICD-10-CM | POA: Diagnosis not present

## 2019-12-05 LAB — CUP PACEART REMOTE DEVICE CHECK
Battery Remaining Longevity: 37 mo
Battery Voltage: 2.96 V
Brady Statistic AP VP Percent: 26.92 %
Brady Statistic AP VS Percent: 0.22 %
Brady Statistic AS VP Percent: 70.62 %
Brady Statistic AS VS Percent: 2.24 %
Brady Statistic RA Percent Paced: 26.3 %
Brady Statistic RV Percent Paced: 90.8 %
Date Time Interrogation Session: 20210623022602
HighPow Impedance: 55 Ohm
Implantable Lead Implant Date: 20171212
Implantable Lead Implant Date: 20171212
Implantable Lead Implant Date: 20171212
Implantable Lead Location: 753858
Implantable Lead Location: 753859
Implantable Lead Location: 753860
Implantable Lead Model: 4298
Implantable Lead Model: 5076
Implantable Pulse Generator Implant Date: 20171212
Lead Channel Impedance Value: 103.636
Lead Channel Impedance Value: 103.636
Lead Channel Impedance Value: 116.923
Lead Channel Impedance Value: 130.286
Lead Channel Impedance Value: 130.286
Lead Channel Impedance Value: 190 Ohm
Lead Channel Impedance Value: 228 Ohm
Lead Channel Impedance Value: 228 Ohm
Lead Channel Impedance Value: 247 Ohm
Lead Channel Impedance Value: 304 Ohm
Lead Channel Impedance Value: 304 Ohm
Lead Channel Impedance Value: 342 Ohm
Lead Channel Impedance Value: 361 Ohm
Lead Channel Impedance Value: 361 Ohm
Lead Channel Impedance Value: 418 Ohm
Lead Channel Impedance Value: 418 Ohm
Lead Channel Impedance Value: 418 Ohm
Lead Channel Impedance Value: 418 Ohm
Lead Channel Pacing Threshold Amplitude: 0.625 V
Lead Channel Pacing Threshold Amplitude: 0.75 V
Lead Channel Pacing Threshold Amplitude: 0.875 V
Lead Channel Pacing Threshold Pulse Width: 0.4 ms
Lead Channel Pacing Threshold Pulse Width: 0.4 ms
Lead Channel Pacing Threshold Pulse Width: 0.4 ms
Lead Channel Sensing Intrinsic Amplitude: 15.25 mV
Lead Channel Sensing Intrinsic Amplitude: 15.25 mV
Lead Channel Sensing Intrinsic Amplitude: 3.875 mV
Lead Channel Sensing Intrinsic Amplitude: 3.875 mV
Lead Channel Setting Pacing Amplitude: 1.25 V
Lead Channel Setting Pacing Amplitude: 2 V
Lead Channel Setting Pacing Amplitude: 2.5 V
Lead Channel Setting Pacing Pulse Width: 0.4 ms
Lead Channel Setting Pacing Pulse Width: 0.4 ms
Lead Channel Setting Sensing Sensitivity: 0.3 mV

## 2019-12-06 NOTE — Progress Notes (Signed)
Remote ICD transmission.   

## 2019-12-28 ENCOUNTER — Other Ambulatory Visit: Payer: Self-pay

## 2019-12-28 DIAGNOSIS — N185 Chronic kidney disease, stage 5: Secondary | ICD-10-CM

## 2020-01-08 ENCOUNTER — Encounter: Payer: Self-pay | Admitting: Physician Assistant

## 2020-01-08 ENCOUNTER — Other Ambulatory Visit: Payer: Self-pay

## 2020-01-08 ENCOUNTER — Ambulatory Visit (HOSPITAL_COMMUNITY)
Admission: RE | Admit: 2020-01-08 | Discharge: 2020-01-08 | Disposition: A | Discharge status: Home / Self Care | Payer: Medicare Other | Source: Ambulatory Visit | Attending: Vascular Surgery | Admitting: Vascular Surgery

## 2020-01-08 ENCOUNTER — Ambulatory Visit (INDEPENDENT_AMBULATORY_CARE_PROVIDER_SITE_OTHER): Payer: Medicare Other | Admitting: Physician Assistant

## 2020-01-08 VITALS — BP 114/57 | HR 75 | Temp 97.9°F | Resp 20 | Ht 72.0 in | Wt 268.1 lb

## 2020-01-08 DIAGNOSIS — N185 Chronic kidney disease, stage 5: Secondary | ICD-10-CM

## 2020-01-08 NOTE — Progress Notes (Signed)
POST OPERATIVE OFFICE NOTE    CC:  F/u for surgery  HPI:  This is a 69 y.o. male who is s/p right forearm AV fistula 05/2019.  He was last seen 07/10/2019 and not yet on HD.    Pt returns today for follow up secondary to starting HD and having difficult cannulation per Dr. Augustin Coupe.  Since then he has had a TDC placed by Dr. Augustin Coupe.  He has HD M-W-F.    No Known Allergies  Current Outpatient Medications  Medication Sig Dispense Refill  . allopurinol (ZYLOPRIM) 300 MG tablet Take 300 mg by mouth daily.    Marland Kitchen amLODipine-benazepril (LOTREL) 10-40 MG per capsule Take 1 capsule by mouth every morning.     Marland Kitchen aspirin EC 81 MG tablet Take 81 mg by mouth at bedtime.    . carvedilol (COREG) 25 MG tablet Take 25 mg by mouth 2 (two) times daily with a meal. Takes only 1 tablet on HD days    . furosemide (LASIX) 80 MG tablet Take 80 mg by mouth 2 (two) times daily.     Marland Kitchen HUMALOG KWIKPEN 100 UNIT/ML KiwkPen Inject 14 Units into the skin daily after breakfast.   5  . insulin glargine (LANTUS) 100 UNIT/ML injection Inject 44 Units into the skin at bedtime.     . multivitamin (RENA-VIT) TABS tablet Take 1 tablet by mouth daily.    . NON FORMULARY CPAP: At bedtime    . rosuvastatin (CRESTOR) 20 MG tablet Take 20 mg by mouth daily.    . sevelamer carbonate (RENVELA) 800 MG tablet Take 1,600 mg by mouth 3 (three) times daily with meals. Also takes 1 tablet before snack     No current facility-administered medications for this visit.     ROS:  See HPI  Physical Exam:   Findings:  +--------------------+----------+-----------------+--------+  AVF         PSV (cm/s)Flow Vol (mL/min)Comments  +--------------------+----------+-----------------+--------+  Native artery inflow  348      621          +--------------------+----------+-----------------+--------+  AVF Anastomosis     510                   +--------------------+----------+-----------------+--------+     +------------+----------+-------------+----------+-------------------------  ----+  OUTFLOW VEINPSV (cm/s)Diameter (cm)Depth (cm)     Describe        +------------+----------+-------------+----------+-------------------------  ----+  Mid UA     39    0.45     0.49                   +------------+----------+-------------+----------+-------------------------  ----+  Dist UA    21 / 38  0.68 / 0.54  0.23 /                                      0.34                   +------------+----------+-------------+----------+-------------------------  ----+  AC Fossa    112    0.53     0.35                   +------------+----------+-------------+----------+-------------------------  ----+  Prox Forearm  110    0.74     0.25    competing branch  0.21    +------------+----------+-------------+----------+-------------------------  ----+  Mid Forearm   126    0.63  0.42  competing branch 0.14 21  cm/s  +------------+----------+-------------+----------+-------------------------  ----+  Dist Forearm  346    0.47     0.31                   +------------+----------+-------------+----------+-------------------------  ----+        Summary:  Patent arteriovenous fistula with no visualized stenosis. Decreased peak  systolic noted in the mid to distal upper arm.    Incision:  Incision well healed with palpable thrill to [facility]  The fistula is visible half way up his forearm.  Extremities:  Palpable radial pulse, sensation and grip intact. heart: RRR Abdomen:  Soft NTTP Lungs: clear to auscultation . Assessment/Plan:  This is a 68 y.o. male who is s/p:right forearm radial cephalic fistula creation which is now   Malfunctioning and not fully matured.    His UE cephalic has developed some since the fistula has been created.    I discussed the duplex results of competing branches and un maturation size < 0.6 cm with Dr. Donnetta Hutching and we have decided to schedule him for fistulogram with possible intervention verses new fistula creation.  If we can't intervene then he has an acceptable basilic on the right according to the vein mapping.    Roxy Horseman PA-C [facility] [phone]  Clinic MD:  Early

## 2020-01-09 ENCOUNTER — Other Ambulatory Visit: Payer: Self-pay

## 2020-01-09 MED ORDER — SODIUM CHLORIDE 0.9 % IV SOLN
250.0000 mL | INTRAVENOUS | Status: AC | PRN
Start: 2020-01-09 — End: ?

## 2020-01-29 ENCOUNTER — Telehealth: Payer: Self-pay

## 2020-01-29 ENCOUNTER — Ambulatory Visit (INDEPENDENT_AMBULATORY_CARE_PROVIDER_SITE_OTHER): Payer: Medicare Other | Admitting: Physician Assistant

## 2020-01-29 ENCOUNTER — Encounter: Payer: Self-pay | Admitting: Physician Assistant

## 2020-01-29 ENCOUNTER — Other Ambulatory Visit: Payer: Self-pay

## 2020-01-29 VITALS — BP 102/58 | HR 68 | Ht 72.0 in | Wt 263.0 lb

## 2020-01-29 DIAGNOSIS — I5022 Chronic systolic (congestive) heart failure: Secondary | ICD-10-CM | POA: Diagnosis not present

## 2020-01-29 DIAGNOSIS — G4733 Obstructive sleep apnea (adult) (pediatric): Secondary | ICD-10-CM

## 2020-01-29 DIAGNOSIS — I2510 Atherosclerotic heart disease of native coronary artery without angina pectoris: Secondary | ICD-10-CM | POA: Diagnosis not present

## 2020-01-29 DIAGNOSIS — I429 Cardiomyopathy, unspecified: Secondary | ICD-10-CM

## 2020-01-29 DIAGNOSIS — Z95810 Presence of automatic (implantable) cardiac defibrillator: Secondary | ICD-10-CM

## 2020-01-29 DIAGNOSIS — I10 Essential (primary) hypertension: Secondary | ICD-10-CM

## 2020-01-29 NOTE — Patient Instructions (Signed)
Medication Instructions:  Your physician recommends that you continue on your current medications as directed. Please refer to the Current Medication list given to you today.  *If you need a refill on your cardiac medications before your next appointment, please call your pharmacy*   Lab Work: None ordered  If you have labs (blood work) drawn today and your tests are completely normal, you will receive your results only by: . MyChart Message (if you have MyChart) OR . A paper copy in the mail If you have any lab test that is abnormal or we need to change your treatment, we will call you to review the results.   Testing/Procedures: None ordered   Follow-Up: At [facility], you and your health needs are our priority.  As part of our continuing mission to provide you with exceptional heart care, we have created designated Provider Care Teams.  These Care Teams include your primary Cardiologist (physician) and Advanced Practice Providers (APPs -  Physician Assistants and Nurse Practitioners) who all work together to provide you with the care you need, when you need it.  We recommend signing up for the patient portal called "MyChart".  Sign up information is provided on this After Visit Summary.  MyChart is used to connect with patients for Virtual Visits (Telemedicine).  Patients are able to view lab/test results, encounter notes, upcoming appointments, etc.  Non-urgent messages can be sent to your provider as well.   To learn more about what you can do with MyChart, go to [email or web address].    Your next appointment:   6 month(s)  The format for your next appointment:   In Person  Provider:   You may see Henry W Smith III, MD or one of the following Advanced Practice Providers on your designated Care Team:    Lori Gerhardt, NP  Laura Ingold, NP  Jill McDaniel, NP    Other Instructions   

## 2020-01-29 NOTE — Telephone Encounter (Signed)
Patient was seen in  Office today and was suspected of afib but ECG showed nothing. Patient was instructed to go home and send a remote transmission and once received please call patient and inform if any afib was seen.

## 2020-01-29 NOTE — Progress Notes (Signed)
Cardiology Office Note    Date:  01/29/2020   ID:  Matthew Wood., DOB Sep 11, 1950, MRN [id number]  PCP:  Matthew Carol, MD  Cardiologist: Dr. Tamala Wood Sleep medicine:  Dr. Radford Wood EP: Dr. Curt Wood  Chief Complaint: ? afib  History of Present Illness:   Matthew Wood. is a 69 y.o. male with hx of CAD, HTN, chronic LBBB, OSA on CPAP, CVA, chronic combined CHF, ICM s/p CRT-D 05/2016 multinodular goiter, DM and ESRD on HD here for abnormal heart rhythm.   Diuretics was increased when seen by Dr. Tamala Wood 08/2019. Now on hemodialysis.   Few weeks ago patient felt abnormal heart rhythm and few days later nurse at dialysis said that she may have heard afib.  Patient denies any palpitation.  No exercise.  Has sedentary lifestyle.  Has chronic shortness of breath with any activity.  Lower extremity edema has been resolved since initiation of dialysis 3 to 4 months ago.  He still makes little urine.  Compliant with medications.  Past Medical History:  Diagnosis Date  . Acute respiratory failure ([place]) 10/04/2017  . Anemia   . Anemia in chronic kidney disease 09/29/2019  . Arthritis of right knee 10/05/2017  . CAD (coronary artery disease), native coronary artery 11/15/2013   70-80% mid RCA with marked ectasia.   . Cardiorenal syndrome with renal failure 10/04/2017  . Carotid aneurysm, left ([place]) 11/15/2013  . CHF (congestive heart failure) ([place])   . CKD (chronic kidney disease), stage IV ([place]) 07/20/2016  . CKD (chronic kidney disease), stage V ([place]) 03/27/2019  . Coagulation defect, unspecified ([place]) 09/29/2019  . Coronary artery disease    a. LHC 04/27/10 left main patent, 30-40% LAD, mid circumflex 30%, 80% mid RCA accounting for the appearance of an infract/peri-infract ischemia on nuclear testing, LV EF of 30-45%--> medical therapy  . CVA (cerebrovascular accident) ([place]) 2011   potine; "little balance problems since" (05/25/2016)  . Dyspnea   . Elevated troponin 10/04/2017  . End stage  renal disease ([place]) 09/29/2019  . Erectile dysfunction 11/15/2013  . Essential hypertension 11/15/2013  . Gout   . Gout, unspecified 10/04/2019  . Hematuria 10/05/2017  . History of kidney stones    "passed them" (05/25/2016)  . Hyperlipidemia   . Hypertension   . Iron deficiency anemia, unspecified 09/29/2019  . LBBB (left bundle branch block)   . Left pontine CVA ([place]) 11/15/2013  . Multinodular goiter   . Obesity   . OSA (obstructive sleep apnea) 11/15/2013  . OSA on CPAP   . Presence of permanent cardiac pacemaker   . Renal insufficiency   . Right upper lobe pneumonia 10/04/2017  . Secondary hyperparathyroidism of renal origin ([place]) 09/29/2019  . Type 2 diabetes mellitus with nephropathy ([place]) 10/05/2017  . Type II diabetes mellitus ([place])   . Unspecified sequelae of cerebral infarction 10/04/2019    Past Surgical History:  Procedure Laterality Date  . AV FISTULA PLACEMENT Right 05/25/2019   Procedure: ARTERIOVENOUS (AV) FISTULA CREATION RIGHT ARM;  Surgeon: Marty Heck, MD;  Location: [facility];  Service: Vascular;  Laterality: Right;  . COLONOSCOPY WITH PROPOFOL N/A 04/16/2014   Procedure: COLONOSCOPY WITH PROPOFOL;  Surgeon: [name], MD;  [facility];  Service: Endoscopy;  Laterality: N/A;  . EP IMPLANTABLE DEVICE N/A 05/25/2016   Procedure: BiV ICD Insertion CRT-D;  Surgeon: [name], MD;  Location: [facility];  Service: Cardiovascular;  Laterality: N/A;  . INSERT / REPLACE /  REMOVE PACEMAKER  05/25/2016   biventricular pacemaker  . LAPAROSCOPIC CHOLECYSTECTOMY      Current Medications: Prior to Admission medications   Medication Sig Start Date End Date Taking? Authorizing Provider  allopurinol (ZYLOPRIM) 300 MG tablet Take 300 mg by mouth daily.    [provider]  amLODipine-benazepril (LOTREL) 10-40 MG per capsule Take 1 capsule by mouth every morning.     [provider]  aspirin EC 81 MG tablet Take 81 mg by mouth at  bedtime.    [provider]  b complex-vitamin c-folic acid (NEPHRO-VITE) 0.8 MG TABS tablet Take 1 tablet by mouth at bedtime.    [provider]  carvedilol (COREG) 25 MG tablet Take 25 mg by mouth 2 (two) times daily with a meal. Takes only 1 tablet on HD days    [provider]  doxercalciferol (HECTOROL) 0.5 MCG capsule Doxercalciferol (Hectorol) 11/05/19 11/03/20  [provider]  heparin 1000 unit/mL SOLN injection Heparin Sodium (Porcine) 1,000 Units/mL Catheter Lock Arterial 10/04/19 11/03/20  [provider]  HUMALOG KWIKPEN 100 UNIT/ML KiwkPen Inject 15 Units into the skin daily after breakfast.  09/19/17   [provider]  insulin glargine (LANTUS) 100 UNIT/ML injection Inject 28 Units into the skin at bedtime.     [provider]  Methoxy PEG-Epoetin Beta (MIRCERA IJ) Mircera 10/18/19 11/03/20  [provider]  NON FORMULARY CPAP: At bedtime    [provider]  rosuvastatin (CRESTOR) 20 MG tablet Take 20 mg by mouth daily.    [provider]  sevelamer carbonate (RENVELA) 800 MG tablet Take 800-1,600 mg by mouth See admin instructions. 1600 mg with meals. And 800 mg with snacks    [provider]    Allergies:   Patient has no known allergies.   Social History   Socioeconomic History  . Marital status: Married    Spouse name: Not on file  . Number of children: 1  . Years of education: Not on file  . Highest education level: Not on file  Occupational History  . Occupation: Diplomatic Services operational officer: [employer]  Tobacco Use  . Smoking status: Former Smoker    Packs/day: 0.50    Years: 27.00    Pack years: 13.50    Types: Cigarettes    Quit date: 03/25/2010    Years since quitting: 9.8  . Smokeless tobacco: Never Used  Vaping Use  . Vaping Use: Never used  Substance and Sexual Activity  . Alcohol use: Not Currently    Comment: 05/25/2016 "was an occasional drinker; maybe 4  drinks/week; stopped after stroke in 2011"  . Drug use: No  . Sexual activity: Not on file  Other Topics Concern  . Not on file  Social History Narrative  . Not on file   Social Determinants of Health   Financial Resource Strain:   . Difficulty of Paying Living Expenses:   Food Insecurity:   . Worried About Charity fundraiser in the Last Year:   . Arboriculturist in the Last Year:   Transportation Needs:   . Film/video editor (Medical):   Marland Kitchen Lack of Transportation (Non-Medical):   Physical Activity:   . Days of Exercise per Week:   . Minutes of Exercise per Session:   Stress:   . Feeling of Stress :   Social Connections:   . Frequency of Communication with Friends and Family:   . Frequency of Social Gatherings  with Friends and Family:   . Attends Religious Services:   . Active Member of Clubs or Organizations:   . Attends Archivist Meetings:   Marland Kitchen Marital Status:      Family History:  The patient's family history includes Breast cancer in his mother; Diabetes type II in his father; Heart attack in his father; Hypertension in his mother and sister; Stroke in his father.   ROS:   Please see the history of present illness.    ROS All other systems reviewed and are negative.   PHYSICAL EXAM:   VS:  BP (!) 102/58   Pulse 68   Ht 6' (1.829 m)   Wt 263 lb (119.3 kg)   SpO2 98%   BMI 35.67 kg/m    GEN: Well nourished, well developed, in no acute distress  HEENT: normal  Neck: no JVD, carotid bruits, or masses Cardiac: RRR; no murmurs, rubs, or gallops,no edema  Respiratory:  clear to auscultation bilaterally, normal work of breathing GI: soft, nontender, nondistended, + BS MS: no deformity or atrophy  Skin: warm and dry, no rash Neuro:  Alert and Oriented x 3, Strength and sensation are intact Psych: euthymic mood, full affect  Wt Readings from Last 3 Encounters:  01/29/20 263 lb (119.3 kg)  01/08/20 (!) 268 lb 1.6 oz (121.6 kg)  11/05/19 265 lb  (120.2 kg)      Studies/Labs Reviewed:   EKG:  EKG is ordered today.  The ekg ordered today demonstrates AV paced rhythm  Recent Labs: 09/18/2019: ALT 21; BUN 196; Creatinine, Ser 6.03; Hemoglobin 8.0; Platelets 135; Potassium 4.4; Sodium 140   Lipid Panel    Component Value Date/Time   CHOL  02/08/2010 0600    171        ATP III CLASSIFICATION:  <200     mg/dL   Desirable  200-239  mg/dL   Borderline High  >=240    mg/dL   High          TRIG 248 (H) 02/08/2010 0600   HDL 34 (L) 02/08/2010 0600   CHOLHDL 5.0 02/08/2010 0600   VLDL 50 (H) 02/08/2010 0600   LDLCALC  02/08/2010 0600    87        Total Cholesterol/HDL:CHD Risk Coronary Heart Disease Risk Table                     Men   Women  1/2 Average Risk   3.4   3.3  Average Risk       5.0   4.4  2 X Average Risk   9.6   7.1  3 X Average Risk  23.4   11.0        Use the calculated Patient Ratio above and the CHD Risk Table to determine the patient's CHD Risk.        ATP III CLASSIFICATION (LDL):  <100     mg/dL   Optimal  100-129  mg/dL   Near or Above                    Optimal  130-159  mg/dL   Borderline  160-189  mg/dL   High  >190     mg/dL   Very High    Additional studies/ records that were reviewed today include:   Echocardiogram: 08/2019 IMPRESSIONS    1. Severe global reduction in LV systolic function; grade 1 diastolic  dysfunction; severe LVE; moderate LVH; mildly  dilated aortic root; mild  AI; moderate LAE; mild RVE; small pericardial effusion.  2. Left ventricular ejection fraction, by estimation, is 25 to 30%. The  left ventricle has severely decreased function. The left ventricle  demonstrates global hypokinesis. The left ventricular internal cavity size  was severely dilated. There is moderate  left ventricular hypertrophy. Left ventricular diastolic parameters are  consistent with Grade I diastolic dysfunction (impaired relaxation).  Elevated left atrial pressure.  3. Right  ventricular systolic function is normal. The right ventricular  size is mildly enlarged.  4. Left atrial size was moderately dilated.  5. The mitral valve is normal in structure. Trivial mitral valve  regurgitation. No evidence of mitral stenosis.  6. The aortic valve is tricuspid. Aortic valve regurgitation is mild.  Mild to moderate aortic valve sclerosis/calcification is present, without  any evidence of aortic stenosis.  7. Aortic dilatation noted. There is mild dilatation of the ascending  aorta measuring 42 mm.  8. The inferior vena cava is normal in size with greater than 50%  respiratory variability, suggesting right atrial pressure of 3 mmHg.    ASSESSMENT & PLAN:    1. Abnormal heart rhythm [facility] may have heart atrial fibrillation.  Patient may have some fluttering sensation but no persistent tachycardia.  EKG today showed AV paced rhythm.  Will interrogate device remotely to rule out any arrhythmia.  2.  CAD -No angina.  He has chronic shortness of due to deconditioning. -Continue aspirin, Coreg and Crestor.  3.  End-stage renal disease -On hemodialysis  4.  Chronic combined CHF -Euvolemic -Volume managed by dialysis.  He still makes some urine.  Not on diuretic anymore.  5.  S/p ICD -Followed by Dr. Curt Wood  Medication Adjustments/Labs and Tests Ordered: Current medicines are reviewed at length with the patient today.  Concerns regarding medicines are outlined above.  Medication changes, Labs and Tests ordered today are listed in the Patient Instructions below. Patient Instructions  Medication Instructions:  Your physician recommends that you continue on your current medications as directed. Please refer to the Current Medication list given to you today.  *If you need a refill on your cardiac medications before your next appointment, please call your pharmacy*   Lab Work: None ordered  If you have labs (blood work) drawn today and  your tests are completely normal, you will receive your results only by: Marland Kitchen MyChart Message (if you have MyChart) OR . A paper copy in the mail If you have any lab test that is abnormal or we need to change your treatment, we will call you to review the results.   Testing/Procedures: None ordered   Follow-Up: At [facility], you and your health needs are our priority.  As part of our continuing mission to provide you with exceptional heart care, we have created designated Provider Care Teams.  These Care Teams include your primary Cardiologist (physician) and Advanced Practice Providers (APPs -  Physician Assistants and Nurse Practitioners) who all work together to provide you with the care you need, when you need it.  We recommend signing up for the patient portal called "MyChart".  Sign up information is provided on this After Visit Summary.  MyChart is used to connect with patients for Virtual Visits (Telemedicine).  Patients are able to view lab/test results, encounter notes, upcoming appointments, etc.  Non-urgent messages can be sent to your provider as well.   To learn more about what you can do with MyChart, go to [email or web address].  Your next appointment:   6 month(s)  The format for your next appointment:   In Person  Provider:   You may see Sinclair Grooms, MD or one of the following Advanced Practice Providers on your designated Care Team:    Truitt Merle, NP  Cecilie Kicks, NP  Kathyrn Drown, NP    Other Instructions      Signed, Leanor Kail, [place]  01/29/2020 3:17 PM    [address], [place], [place]  [address] Phone: [phone]; Fax: [phone]

## 2020-01-30 NOTE — Telephone Encounter (Signed)
Transmission received. I told the pt that the nurse will review and give him a call back.

## 2020-01-30 NOTE — Telephone Encounter (Signed)
Checked for remote transmission. Ws not received. Called patient to assist, he is unable to send at this time but will call DC back in 30 minutes to assist. Direct Number provided.

## 2020-01-30 NOTE — Telephone Encounter (Signed)
Patient called and updated. No new findings of AF on device. Questions answered. Patient advised to call DC back if he has any further. Patient verbalizes understanding.

## 2020-01-31 ENCOUNTER — Encounter (HOSPITAL_COMMUNITY)
Admission: RE | Disposition: A | Discharge status: Home / Self Care | Payer: Medicare Other | Source: Ambulatory Visit | Attending: Vascular Surgery

## 2020-01-31 ENCOUNTER — Encounter (HOSPITAL_COMMUNITY): Payer: Self-pay | Admitting: Vascular Surgery

## 2020-01-31 ENCOUNTER — Ambulatory Visit (HOSPITAL_COMMUNITY)
Admission: RE | Admit: 2020-01-31 | Discharge: 2020-01-31 | Disposition: A | Discharge status: Home / Self Care | Payer: Medicare Other | Source: Ambulatory Visit | Attending: Vascular Surgery | Admitting: Vascular Surgery

## 2020-01-31 DIAGNOSIS — Z20822 Contact with and (suspected) exposure to covid-19: Secondary | ICD-10-CM | POA: Diagnosis not present

## 2020-01-31 DIAGNOSIS — I2510 Atherosclerotic heart disease of native coronary artery without angina pectoris: Secondary | ICD-10-CM | POA: Insufficient documentation

## 2020-01-31 DIAGNOSIS — Z794 Long term (current) use of insulin: Secondary | ICD-10-CM | POA: Diagnosis not present

## 2020-01-31 DIAGNOSIS — T82898A Other specified complication of vascular prosthetic devices, implants and grafts, initial encounter: Secondary | ICD-10-CM | POA: Diagnosis not present

## 2020-01-31 DIAGNOSIS — D631 Anemia in chronic kidney disease: Secondary | ICD-10-CM | POA: Insufficient documentation

## 2020-01-31 DIAGNOSIS — Y832 Surgical operation with anastomosis, bypass or graft as the cause of abnormal reaction of the patient, or of later complication, without mention of misadventure at the time of the procedure: Secondary | ICD-10-CM | POA: Diagnosis not present

## 2020-01-31 DIAGNOSIS — Z950 Presence of cardiac pacemaker: Secondary | ICD-10-CM | POA: Diagnosis not present

## 2020-01-31 DIAGNOSIS — Z6835 Body mass index (BMI) 35.0-35.9, adult: Secondary | ICD-10-CM | POA: Diagnosis not present

## 2020-01-31 DIAGNOSIS — E1122 Type 2 diabetes mellitus with diabetic chronic kidney disease: Secondary | ICD-10-CM | POA: Insufficient documentation

## 2020-01-31 DIAGNOSIS — G4733 Obstructive sleep apnea (adult) (pediatric): Secondary | ICD-10-CM | POA: Diagnosis not present

## 2020-01-31 DIAGNOSIS — Z8673 Personal history of transient ischemic attack (TIA), and cerebral infarction without residual deficits: Secondary | ICD-10-CM | POA: Diagnosis not present

## 2020-01-31 DIAGNOSIS — E669 Obesity, unspecified: Secondary | ICD-10-CM | POA: Diagnosis not present

## 2020-01-31 DIAGNOSIS — T82510A Breakdown (mechanical) of surgically created arteriovenous fistula, initial encounter: Secondary | ICD-10-CM | POA: Diagnosis not present

## 2020-01-31 DIAGNOSIS — I447 Left bundle-branch block, unspecified: Secondary | ICD-10-CM | POA: Diagnosis not present

## 2020-01-31 DIAGNOSIS — N2581 Secondary hyperparathyroidism of renal origin: Secondary | ICD-10-CM | POA: Insufficient documentation

## 2020-01-31 DIAGNOSIS — N186 End stage renal disease: Secondary | ICD-10-CM | POA: Diagnosis not present

## 2020-01-31 DIAGNOSIS — E785 Hyperlipidemia, unspecified: Secondary | ICD-10-CM | POA: Diagnosis not present

## 2020-01-31 DIAGNOSIS — Z7982 Long term (current) use of aspirin: Secondary | ICD-10-CM | POA: Diagnosis not present

## 2020-01-31 DIAGNOSIS — Z79899 Other long term (current) drug therapy: Secondary | ICD-10-CM | POA: Diagnosis not present

## 2020-01-31 DIAGNOSIS — I509 Heart failure, unspecified: Secondary | ICD-10-CM | POA: Insufficient documentation

## 2020-01-31 DIAGNOSIS — I132 Hypertensive heart and chronic kidney disease with heart failure and with stage 5 chronic kidney disease, or end stage renal disease: Secondary | ICD-10-CM | POA: Diagnosis not present

## 2020-01-31 DIAGNOSIS — M109 Gout, unspecified: Secondary | ICD-10-CM | POA: Insufficient documentation

## 2020-01-31 DIAGNOSIS — Z992 Dependence on renal dialysis: Secondary | ICD-10-CM | POA: Diagnosis not present

## 2020-01-31 HISTORY — PX: A/V FISTULAGRAM: CATH118298

## 2020-01-31 LAB — POCT I-STAT, CHEM 8
BUN: 34 mg/dL — ABNORMAL HIGH (ref 8–23)
Calcium, Ion: 1.15 mmol/L (ref 1.15–1.40)
Chloride: 96 mmol/L — ABNORMAL LOW (ref 98–111)
Creatinine, Ser: 5.9 mg/dL — ABNORMAL HIGH (ref 0.61–1.24)
Glucose, Bld: 121 mg/dL — ABNORMAL HIGH (ref 70–99)
HCT: 38 % — ABNORMAL LOW (ref 39.0–52.0)
Hemoglobin: 12.9 g/dL — ABNORMAL LOW (ref 13.0–17.0)
Potassium: 3.5 mmol/L (ref 3.5–5.1)
Sodium: 138 mmol/L (ref 135–145)
TCO2: 33 mmol/L — ABNORMAL HIGH (ref 22–32)

## 2020-01-31 LAB — GLUCOSE, CAPILLARY
Glucose-Capillary: 153 mg/dL — ABNORMAL HIGH (ref 70–99)
Glucose-Capillary: 69 mg/dL — ABNORMAL LOW (ref 70–99)
Glucose-Capillary: 85 mg/dL (ref 70–99)

## 2020-01-31 LAB — SARS CORONAVIRUS 2 BY RT PCR (HOSPITAL ORDER, PERFORMED IN ~~LOC~~ HOSPITAL LAB): SARS Coronavirus 2: NEGATIVE

## 2020-01-31 SURGERY — A/V FISTULAGRAM
Anesthesia: LOCAL | Laterality: Right

## 2020-01-31 MED ORDER — IODIXANOL 320 MG/ML IV SOLN
INTRAVENOUS | Status: DC | PRN
  Administered 2020-01-31: 70 mL

## 2020-01-31 MED ORDER — HEPARIN (PORCINE) IN NACL 1000-0.9 UT/500ML-% IV SOLN
INTRAVENOUS | Status: DC | PRN
  Administered 2020-01-31: 500 mL

## 2020-01-31 MED ORDER — LIDOCAINE HCL (PF) 1 % IJ SOLN
INTRAMUSCULAR | Status: DC | PRN
  Administered 2020-01-31: 2 mL

## 2020-01-31 MED ORDER — SODIUM CHLORIDE 0.9% FLUSH: 3.0000 mL | INTRAVENOUS | Status: DC | PRN

## 2020-01-31 MED ORDER — LIDOCAINE HCL (PF) 1 % IJ SOLN
INTRAMUSCULAR | Status: AC
  Filled 2020-01-31: qty 30

## 2020-01-31 MED ORDER — HEPARIN (PORCINE) IN NACL 1000-0.9 UT/500ML-% IV SOLN
INTRAVENOUS | Status: AC
  Filled 2020-01-31: qty 1000

## 2020-01-31 MED ORDER — SODIUM CHLORIDE 0.9% FLUSH: 3.0000 mL | Freq: Two times a day (BID) | INTRAVENOUS | Status: DC

## 2020-01-31 SURGICAL SUPPLY — 9 items
BAG SNAP BAND KOVER 36X36 (MISCELLANEOUS) ×4 IMPLANT
COVER DOME SNAP 22 D (MISCELLANEOUS) ×4 IMPLANT
KIT MICROPUNCTURE NIT STIFF (SHEATH) ×2 IMPLANT
PROTECTION STATION PRESSURIZED (MISCELLANEOUS) ×2
SHEATH PROBE COVER 6X72 (BAG) ×4 IMPLANT
STATION PROTECTION PRESSURIZED (MISCELLANEOUS) ×1 IMPLANT
STOPCOCK MORSE 400PSI 3WAY (MISCELLANEOUS) ×2 IMPLANT
TRAY PV CATH (CUSTOM PROCEDURE TRAY) ×2 IMPLANT
TUBING CIL FLEX 10 FLL-RA (TUBING) ×2 IMPLANT

## 2020-01-31 NOTE — Discharge Instructions (Signed)

## 2020-01-31 NOTE — H&P (Signed)
History and Physical Interval Note:  01/31/2020 9:53 AM  [name]  has presented today for surgery, with the diagnosis of kidney disease.  The various methods of treatment have been discussed with the patient and family. After consideration of risks, benefits and other options for treatment, the patient has consented to  Procedure(s): A/V FISTULAGRAM (Right) as a surgical intervention.  The patient's history has been reviewed, patient examined, no change in status, stable for surgery.  I have reviewed the patient's chart and labs.  Questions were answered to the patient's satisfaction.    Right arm fistulogram.  Marty Heck  POST OPERATIVE OFFICE NOTE    CC:  F/u for surgery  HPI:  This is a 69 y.o. male who is s/p right forearm AV fistula 05/2019.  He was last seen 07/10/2019 and not yet on HD.    Pt returns today for follow up secondary to starting HD and having difficult cannulation per Dr. Augustin Coupe.  Since then he has had a TDC placed by Dr. Augustin Coupe.  He has HD M-W-F.   Past Medical History:  Diagnosis Date  . Acute respiratory failure ([place]) 10/04/2017  . Anemia   . Anemia in chronic kidney disease 09/29/2019  . Arthritis of right knee 10/05/2017  . CAD (coronary artery disease), native coronary artery 11/15/2013   70-80% mid RCA with marked ectasia.   . Cardiorenal syndrome with renal failure 10/04/2017  . Carotid aneurysm, left ([place]) 11/15/2013  . CHF (congestive heart failure) ([place])   . CKD (chronic kidney disease), stage IV ([place]) 07/20/2016  . CKD (chronic kidney disease), stage V ([place]) 03/27/2019  . Coagulation defect, unspecified ([place]) 09/29/2019  . Coronary artery disease    a. LHC 04/27/10 left main patent, 30-40% LAD, mid circumflex 30%, 80% mid RCA accounting for the appearance of an infract/peri-infract ischemia on nuclear testing, LV EF of 30-45%--> medical therapy  . CVA (cerebrovascular accident) ([place]) 2011   potine; "little balance problems since"  (05/25/2016)  . Dyspnea   . Elevated troponin 10/04/2017  . End stage renal disease ([place]) 09/29/2019  . Erectile dysfunction 11/15/2013  . Essential hypertension 11/15/2013  . Gout   . Gout, unspecified 10/04/2019  . Hematuria 10/05/2017  . History of kidney stones    "passed them" (05/25/2016)  . Hyperlipidemia   . Hypertension   . Iron deficiency anemia, unspecified 09/29/2019  . LBBB (left bundle branch block)   . Left pontine CVA ([place]) 11/15/2013  . Multinodular goiter   . Obesity   . OSA (obstructive sleep apnea) 11/15/2013  . OSA on CPAP   . Presence of permanent cardiac pacemaker   . Renal insufficiency   . Right upper lobe pneumonia 10/04/2017  . Secondary hyperparathyroidism of renal origin ([place]) 09/29/2019  . Type 2 diabetes mellitus with nephropathy ([place]) 10/05/2017  . Type II diabetes mellitus ([place])   . Unspecified sequelae of cerebral infarction 10/04/2019    Past Surgical History:  Procedure Laterality Date  . AV FISTULA PLACEMENT Right 05/25/2019   Procedure: ARTERIOVENOUS (AV) FISTULA CREATION RIGHT ARM;  Surgeon: Marty Heck, MD;  Location: [facility];  Service: Vascular;  Laterality: Right;  . COLONOSCOPY WITH PROPOFOL N/A 04/16/2014   Procedure: COLONOSCOPY WITH PROPOFOL;  Surgeon: [name], MD;  [facility];  Service: Endoscopy;  Laterality: N/A;  . EP IMPLANTABLE DEVICE N/A 05/25/2016   Procedure: BiV ICD Insertion CRT-D;  Surgeon: [name], MD;  Location: [facility];  Service: Cardiovascular;  Laterality: N/A;  . INSERT / REPLACE / REMOVE PACEMAKER  05/25/2016   biventricular pacemaker  . LAPAROSCOPIC CHOLECYSTECTOMY        No Known Allergies        Current Outpatient Medications  Medication Sig Dispense Refill  . allopurinol (ZYLOPRIM) 300 MG tablet Take 300 mg by mouth daily.    Marland Kitchen amLODipine-benazepril (LOTREL) 10-40 MG per capsule Take 1 capsule by mouth every morning.     Marland Kitchen aspirin EC 81 MG tablet Take 81 mg by  mouth at bedtime.    . carvedilol (COREG) 25 MG tablet Take 25 mg by mouth 2 (two) times daily with a meal. Takes only 1 tablet on HD days    . furosemide (LASIX) 80 MG tablet Take 80 mg by mouth 2 (two) times daily.     Marland Kitchen HUMALOG KWIKPEN 100 UNIT/ML KiwkPen Inject 14 Units into the skin daily after breakfast.   5  . insulin glargine (LANTUS) 100 UNIT/ML injection Inject 44 Units into the skin at bedtime.     . multivitamin (RENA-VIT) TABS tablet Take 1 tablet by mouth daily.    . NON FORMULARY CPAP: At bedtime    . rosuvastatin (CRESTOR) 20 MG tablet Take 20 mg by mouth daily.    . sevelamer carbonate (RENVELA) 800 MG tablet Take 1,600 mg by mouth 3 (three) times daily with meals. Also takes 1 tablet before snack     No current facility-administered medications for this visit.     ROS:  See HPI  Physical Exam:   Findings:  +--------------------+----------+-----------------+--------+  AVF         PSV (cm/s)Flow Vol (mL/min)Comments  +--------------------+----------+-----------------+--------+  Native artery inflow  348      621          +--------------------+----------+-----------------+--------+  AVF Anastomosis     510                 +--------------------+----------+-----------------+--------+     +------------+----------+-------------+----------+-------------------------  ----+  OUTFLOW VEINPSV (cm/s)Diameter (cm)Depth (cm)     Describe        +------------+----------+-------------+----------+-------------------------  ----+  Mid UA     39    0.45     0.49                   +------------+----------+-------------+----------+-------------------------  ----+  Dist UA    21 / 38  0.68 / 0.54  0.23 /                                      0.34                    +------------+----------+-------------+----------+-------------------------  ----+  AC Fossa    112    0.53     0.35                   +------------+----------+-------------+----------+-------------------------  ----+  Prox Forearm  110    0.74     0.25    competing branch  0.21    +------------+----------+-------------+----------+-------------------------  ----+  Mid Forearm   126    0.63     0.42  competing branch 0.14 21  cm/s  +------------+----------+-------------+----------+-------------------------  ----+  Dist Forearm  346    0.47     0.31                   +------------+----------+-------------+----------+-------------------------  ----+  Summary:  Patent arteriovenous fistula with no visualized stenosis. Decreased peak  systolic noted in the mid to distal upper arm.    Incision:  Incision well healed with palpable thrill to [facility].  The fistula is visible half way up his forearm.  Extremities:  Palpable radial pulse, sensation and grip intact. heart: RRR Abdomen:  Soft NTTP Lungs: clear to auscultation . Assessment/Plan:  This is a 69 y.o. male who is s/p:right forearm radial cephalic fistula creation which is now  Malfunctioning and not fully matured.               His UE cephalic has developed some since the fistula has been created.               I discussed the duplex results of competing branches and un maturation size < 0.6 cm with Dr. Donnetta Hutching and we have decided to schedule him for fistulogram with possible intervention verses new fistula creation.  If we can't intervene then he has an acceptable basilic on the right according to the vein mapping.    Roxy Horseman PA-C [facility] [phone]

## 2020-01-31 NOTE — Op Note (Signed)
    OPERATIVE NOTE   PROCEDURE: 1. right radiocephalic arteriovenous fistula cannulation under ultrasound guidance 2. right arm fistulogram including central venogram  PRE-OPERATIVE DIAGNOSIS: Difficult to cannulate right arm arteriovenous fistula   POST-OPERATIVE DIAGNOSIS: same as above   SURGEON: Marty Heck, MD  ANESTHESIA: local  ESTIMATED BLOOD LOSS: 5 cc  FINDING(S): 1. The right radiocephalic AV fistula has an excellent thrill.  This was cannulated under ultrasound guidance.  The cephalic vein in the forearm and antecubitum was widely patent with multiple branches and appeared smaller toward the arterial anastmosis.  The cephalic forearm fistula drains into the upper arm cephalic, basilic and deep brachial system.  There is a moderate stenosis in the central venous system in the innominate vein where the catheter is placed but he has no arm swelling or flow issues to justify intervention today.  Will be scheduled for sidebranch ligation of the right forearm fistula to facilitate maturation versus conversion to upper arm fistula.  SPECIMEN(S):  None  CONTRAST: 70 cc  INDICATIONS: Matthew Wood. is a 69 y.o. male who  presents with malfunctioning right arteriovenous fistula.  The patient is scheduled for right arm fistulogram.  The patient is aware the risks include but are not limited to: bleeding, infection, thrombosis of the cannulated access, and possible anaphylactic reaction to the contrast.  The patient is aware of the risks of the procedure and elects to proceed forward.  DESCRIPTION: After full informed written consent was obtained, the patient was brought back to the angiography suite and placed supine upon the angiography table.  The patient was connected to monitoring equipment.  The right arm was prepped and draped in the standard fashion for a right arm fistulogram.  Under ultrasound guidance, the right arteriovenous fistula was evaluated at the wrist,  it was patent, an image was saved.  It was cannulated with a micropuncture needle under ultrasound guidance.  The microwire was advanced into the fistula and the needle was exchanged for the a microsheath, which was lodged 2 cm into the access.  The wire was removed and the sheath was connected to the IV extension tubing.  Hand injections were completed to image the access from the antecubitum up to the level of axilla.  The central venous structures were also imaged by hand injections.  Based on the images, this patient will need: Right cephalic vein sidebranch ligation in the forearm versus conversion upper arm fistula.Marland Kitchen  A 4-0 Monocryl purse-string suture was sewn around the sheath.  The sheath was removed while tying down the suture.  A sterile bandage was applied to the puncture site.  COMPLICATIONS: None  CONDITION: Stale    Marty Heck, MD [facility]: [phone]  01/31/2020 11:22 AM

## 2020-02-11 ENCOUNTER — Other Ambulatory Visit: Payer: Self-pay

## 2020-02-11 ENCOUNTER — Telehealth: Payer: Self-pay

## 2020-02-11 DIAGNOSIS — U071 COVID-19: Secondary | ICD-10-CM | POA: Insufficient documentation

## 2020-02-11 NOTE — Telephone Encounter (Signed)
The pt wife called because he received a usb in the mail for the monitor. I helped install the usb adapter. The pt wife thanked me for my help.

## 2020-02-12 ENCOUNTER — Other Ambulatory Visit (HOSPITAL_COMMUNITY): Payer: Medicare Other

## 2020-02-12 ENCOUNTER — Other Ambulatory Visit (HOSPITAL_COMMUNITY)
Admission: RE | Admit: 2020-02-12 | Discharge: 2020-02-12 | Disposition: A | Discharge status: Home / Self Care | Payer: Medicare Other | Source: Ambulatory Visit | Attending: Vascular Surgery | Admitting: Vascular Surgery

## 2020-02-12 DIAGNOSIS — Z20822 Contact with and (suspected) exposure to covid-19: Secondary | ICD-10-CM | POA: Insufficient documentation

## 2020-02-12 DIAGNOSIS — Z01812 Encounter for preprocedural laboratory examination: Secondary | ICD-10-CM | POA: Insufficient documentation

## 2020-02-12 LAB — SARS CORONAVIRUS 2 (TAT 6-24 HRS): SARS Coronavirus 2: NEGATIVE

## 2020-02-13 ENCOUNTER — Other Ambulatory Visit: Payer: Self-pay

## 2020-02-13 ENCOUNTER — Encounter (HOSPITAL_COMMUNITY): Payer: Self-pay | Admitting: Vascular Surgery

## 2020-02-13 ENCOUNTER — Encounter: Payer: Self-pay | Admitting: Cardiology

## 2020-02-13 MED ORDER — DEXTROSE 5 % IV SOLN
3.0000 g | INTRAVENOUS | Status: AC
  Administered 2020-02-14: 3 g via INTRAVENOUS
  Filled 2020-02-13: qty 3

## 2020-02-13 NOTE — Progress Notes (Signed)
[facility]  Patient Information: Name:  Matthew Wood.  DOB:  [birth year]  MRN:  [id number]   Planned Procedure: RIGHT ARM ARTERIOVENOUS FISTULA REVISION WITH SIDE BRANCH LIGATION VERSUS CONVERSION TO UPPER ARM FISTULA  Surgeon: Dr. [name]  Date of Procedure: 02/14/20  Cautery will be used.  Position during surgery:    Please send documentation back to:  [facility] (Fax # [phone])   Device Information:  Clinic EP Physician:  Allegra Lai, MD  Device Type:  Defibrillator Manufacturer and Phone #:  Medtronic: [phone] Pacemaker Dependent?:  No Date of Last Device Check:  12/05/19 Normal Device Function?:  Yes.    Electrophysiologist's Recommendations:   Have magnet available.  Provide continuous ECG monitoring when magnet is used or reprogramming is to be performed.   Procedure may interfere with device function.  Magnet should be placed over device during procedure.  Per [facility] [address], [place], [place]  9:49 AM 02/13/2020

## 2020-02-13 NOTE — Progress Notes (Signed)
SDW-pre-op call completed by both pt and spouse ( Charmayne ) DPR. Pt denies SOB and chest pain. Pt is under the care of Dr. Tamala Julian, Cardiology and Dr. Curt Bears, Cardiology (EP). Spouse stated that pt PCP is Dr. Seward Carol. Peri-op Prescription for ICD initiated and Medtronic Reps notified. Spouse denies that pt had a cardiac cath. Spouse stated that pt had a stress test > 10 years ago. Spouse made aware to have pt stop taking  vitamins, fish oil and herbal medications. Do not take any NSAIDs ie: Ibuprofen, Advil, Naproxen (Aleve), Motrin, BC and Goody Powder. Spouse made aware to have pt take 50% of Lantus insulin ( 14 units) at HS. Spous made aware to have pt check CBG every 2 hours prior to arrival to hospital on DOS. Spouse made aware to have pt treat a CBG < 70 with  4 ounces of apple juice, wait 15 minutes after intervention to recheck CBG, if CBG remains < 70, call Short Stay unit to speak with a nurse. Spouse stated that pt does not have a sliding scale. Spouse made aware to have pt call SS for CBG > 220. Spouse made aware to have pt continue to quarantine. Spouse verbalized understanding of all pre-op instructions. PA, Anesthesiology, asked to review pt history; see note.

## 2020-02-13 NOTE — Anesthesia Preprocedure Evaluation (Addendum)
Anesthesia Evaluation  Patient identified by MRN, date of birth, ID band Patient awake    Reviewed: Allergy & Precautions, NPO status , Patient's Chart, lab work & pertinent test results  History of Anesthesia Complications (+) PONV and history of anesthetic complications  Airway Mallampati: III  TM Distance: >3 FB Neck ROM: Full    Dental  (+) Dental Advisory Given   Pulmonary shortness of breath, sleep apnea , former smoker,    breath sounds clear to auscultation       Cardiovascular hypertension, + CAD and +CHF  + dysrhythmias + pacemaker  Rhythm:Regular  1. Severe global reduction in LV systolic function; grade 1 diastolic  dysfunction; severe LVE; moderate LVH; mildly dilated aortic root; mild  AI; moderate LAE; mild RVE; small pericardial effusion.  2. Left ventricular ejection fraction, by estimation, is 25 to 30%. The  left ventricle has severely decreased function. The left ventricle  demonstrates global hypokinesis. The left ventricular internal cavity size  was severely dilated. There is moderate  left ventricular hypertrophy. Left ventricular diastolic parameters are  consistent with Grade I diastolic dysfunction (impaired relaxation).  Elevated left atrial pressure.  3. Right ventricular systolic function is normal. The right ventricular  size is mildly enlarged.  4. Left atrial size was moderately dilated.  5. The mitral valve is normal in structure. Trivial mitral valve  regurgitation. No evidence of mitral stenosis.  6. The aortic valve is tricuspid. Aortic valve regurgitation is mild.  Mild to moderate aortic valve sclerosis/calcification is present, without  any evidence of aortic stenosis.  7. Aortic dilatation noted. There is mild dilatation of the ascending  aorta measuring 42 mm.  8. The inferior vena cava is normal in size with greater than 50%  respiratory variability, suggesting right atrial  pressure of 3 mmHg.    Neuro/Psych CVA    GI/Hepatic negative GI ROS, Neg liver ROS,   Endo/Other  diabetes  Renal/GU ESRF and DialysisRenal disease     Musculoskeletal  (+) Arthritis ,   Abdominal   Peds  Hematology  (+) anemia ,   Anesthesia Other Findings   Reproductive/Obstetrics                           Anesthesia Physical Anesthesia Plan  ASA: IV  Anesthesia Plan: General   Post-op Pain Management:    Induction: Intravenous  PONV Risk Score and Plan: 3 and Ondansetron and Dexamethasone  Airway Management Planned: LMA  Additional Equipment: None  Intra-op Plan:   Post-operative Plan: Extubation in OR  Informed Consent: I have reviewed the patients History and Physical, chart, labs and discussed the procedure including the risks, benefits and alternatives for the proposed anesthesia with the patient or authorized representative who has indicated his/her understanding and acceptance.     Dental advisory given  Plan Discussed with: CRNA and Surgeon  Anesthesia Plan Comments: (PAT note by Karoline Caldwell, PA-C: Follows with cardiology for hx of CAD (80% mRCA 04/27/10, medical therapy), HTN, chronic LBBB, OSA on CPAP, CVA, chronic combined CHF, ICM s/p MedtronicCRT-D 05/2016. Last seen by cardiology 01/29/2020 and discussed at that time he had been having some sensations of palpitations.  EKG at that time showed AV paced rhythm.  Discussed device will be interrogated remotely to rule out any arrhythmia.  No changes made to management.  Advised to follow-up in 6 months.  IDDMII.  ESRD on HD via [place].  Will need DOS labs and eval.  EKG 01/29/20:  AV paced rhythm. Rate 68. LBBB.  Periop device orders per note 02/13/20: Device Information:  Clinic EP Physician:  Allegra Lai, MD  Device Type:  Defibrillator Manufacturer and Phone #:  Medtronic: [phone] Pacemaker Dependent?:  No Date of Last Device Check:  12/05/19            Normal Device Function?:  Yes.    Electrophysiologist's Recommendations:  Have magnet available. Provide continuous ECG monitoring when magnet is used or reprogramming is to be performed.  Procedure may interfere with device function.  Magnet should be placed over device during procedure  TTE 09/10/19: 1. Severe global reduction in LV systolic function; grade 1 diastolic  dysfunction; severe LVE; moderate LVH; mildly dilated aortic root; mild  AI; moderate LAE; mild RVE; small pericardial effusion.  2. Left ventricular ejection fraction, by estimation, is 25 to 30%. The  left ventricle has severely decreased function. The left ventricle  demonstrates global hypokinesis. The left ventricular internal cavity size  was severely dilated. There is moderate  left ventricular hypertrophy. Left ventricular diastolic parameters are  consistent with Grade I diastolic dysfunction (impaired relaxation).  Elevated left atrial pressure.  3. Right ventricular systolic function is normal. The right ventricular  size is mildly enlarged.  4. Left atrial size was moderately dilated.  5. The mitral valve is normal in structure. Trivial mitral valve  regurgitation. No evidence of mitral stenosis.  6. The aortic valve is tricuspid. Aortic valve regurgitation is mild.  Mild to moderate aortic valve sclerosis/calcification is present, without  any evidence of aortic stenosis.  7. Aortic dilatation noted. There is mild dilatation of the ascending  aorta measuring 42 mm.  8. The inferior vena cava is normal in size with greater than 50%  respiratory variability, suggesting right atrial pressure of 3 mmHg.    )       Anesthesia Quick Evaluation

## 2020-02-13 NOTE — Progress Notes (Signed)
Anesthesia Chart Review: Same day workup  Follows with cardiology for hx of CAD (80% mRCA 04/27/10, medical therapy), HTN, chronic LBBB, OSA on CPAP, CVA, chronic combined CHF, ICM s/p MedtronicCRT-D 05/2016. Last seen by cardiology 01/29/2020 and discussed at that time he had been having some sensations of palpitations.  EKG at that time showed AV paced rhythm.  Discussed device will be interrogated remotely to rule out any arrhythmia.  No changes made to management.  Advised to follow-up in 6 months.  IDDMII.  ESRD on HD via [place].  Will need DOS labs and eval.   EKG 01/29/20:  AV paced rhythm. Rate 68. LBBB.  Periop device orders per note 02/13/20: Device Information:  Clinic EP Physician:  Allegra Lai, MD  Device Type:  Defibrillator Manufacturer and Phone #:  Medtronic: [phone] Pacemaker Dependent?:  No Date of Last Device Check:  12/05/19           Normal Device Function?:  Yes.    Electrophysiologist's Recommendations:   Have magnet available.  Provide continuous ECG monitoring when magnet is used or reprogramming is to be performed.   Procedure may interfere with device function.  Magnet should be placed over device during procedure  TTE 09/10/19: 1. Severe global reduction in LV systolic function; grade 1 diastolic  dysfunction; severe LVE; moderate LVH; mildly dilated aortic root; mild  AI; moderate LAE; mild RVE; small pericardial effusion.  2. Left ventricular ejection fraction, by estimation, is 25 to 30%. The  left ventricle has severely decreased function. The left ventricle  demonstrates global hypokinesis. The left ventricular internal cavity size  was severely dilated. There is moderate  left ventricular hypertrophy. Left ventricular diastolic parameters are  consistent with Grade I diastolic dysfunction (impaired relaxation).  Elevated left atrial pressure.  3. Right ventricular systolic function is normal. The right ventricular  size is mildly  enlarged.  4. Left atrial size was moderately dilated.  5. The mitral valve is normal in structure. Trivial mitral valve  regurgitation. No evidence of mitral stenosis.  6. The aortic valve is tricuspid. Aortic valve regurgitation is mild.  Mild to moderate aortic valve sclerosis/calcification is present, without  any evidence of aortic stenosis.  7. Aortic dilatation noted. There is mild dilatation of the ascending  aorta measuring 42 mm.  8. The inferior vena cava is normal in size with greater than 50%  respiratory variability, suggesting right atrial pressure of 3 mmHg.    Wynonia Musty [facility]/Anesthesiology Phone [phone] 02/13/2020 3:04 PM

## 2020-02-14 ENCOUNTER — Ambulatory Visit (HOSPITAL_COMMUNITY): Payer: Medicare Other | Admitting: Physician Assistant

## 2020-02-14 ENCOUNTER — Encounter (HOSPITAL_COMMUNITY): Payer: Self-pay | Admitting: Vascular Surgery

## 2020-02-14 ENCOUNTER — Encounter (HOSPITAL_COMMUNITY)
Admission: RE | Disposition: A | Discharge status: Home / Self Care | Payer: Self-pay | Source: Home / Self Care | Attending: Vascular Surgery

## 2020-02-14 ENCOUNTER — Ambulatory Visit (HOSPITAL_COMMUNITY)
Admission: RE | Admit: 2020-02-14 | Discharge: 2020-02-14 | Disposition: A | Discharge status: Home / Self Care | Payer: Medicare Other | Attending: Vascular Surgery | Admitting: Vascular Surgery

## 2020-02-14 DIAGNOSIS — Z6835 Body mass index (BMI) 35.0-35.9, adult: Secondary | ICD-10-CM | POA: Diagnosis not present

## 2020-02-14 DIAGNOSIS — I132 Hypertensive heart and chronic kidney disease with heart failure and with stage 5 chronic kidney disease, or end stage renal disease: Secondary | ICD-10-CM | POA: Insufficient documentation

## 2020-02-14 DIAGNOSIS — Z992 Dependence on renal dialysis: Secondary | ICD-10-CM | POA: Diagnosis not present

## 2020-02-14 DIAGNOSIS — Z95810 Presence of automatic (implantable) cardiac defibrillator: Secondary | ICD-10-CM | POA: Diagnosis not present

## 2020-02-14 DIAGNOSIS — I2510 Atherosclerotic heart disease of native coronary artery without angina pectoris: Secondary | ICD-10-CM | POA: Insufficient documentation

## 2020-02-14 DIAGNOSIS — I447 Left bundle-branch block, unspecified: Secondary | ICD-10-CM | POA: Insufficient documentation

## 2020-02-14 DIAGNOSIS — Z79899 Other long term (current) drug therapy: Secondary | ICD-10-CM | POA: Insufficient documentation

## 2020-02-14 DIAGNOSIS — Z9049 Acquired absence of other specified parts of digestive tract: Secondary | ICD-10-CM | POA: Insufficient documentation

## 2020-02-14 DIAGNOSIS — G4733 Obstructive sleep apnea (adult) (pediatric): Secondary | ICD-10-CM | POA: Insufficient documentation

## 2020-02-14 DIAGNOSIS — D631 Anemia in chronic kidney disease: Secondary | ICD-10-CM | POA: Insufficient documentation

## 2020-02-14 DIAGNOSIS — I350 Nonrheumatic aortic (valve) stenosis: Secondary | ICD-10-CM | POA: Insufficient documentation

## 2020-02-14 DIAGNOSIS — Z7982 Long term (current) use of aspirin: Secondary | ICD-10-CM | POA: Diagnosis not present

## 2020-02-14 DIAGNOSIS — I5042 Chronic combined systolic (congestive) and diastolic (congestive) heart failure: Secondary | ICD-10-CM | POA: Diagnosis not present

## 2020-02-14 DIAGNOSIS — D509 Iron deficiency anemia, unspecified: Secondary | ICD-10-CM | POA: Insufficient documentation

## 2020-02-14 DIAGNOSIS — N185 Chronic kidney disease, stage 5: Secondary | ICD-10-CM

## 2020-02-14 DIAGNOSIS — Y832 Surgical operation with anastomosis, bypass or graft as the cause of abnormal reaction of the patient, or of later complication, without mention of misadventure at the time of the procedure: Secondary | ICD-10-CM | POA: Insufficient documentation

## 2020-02-14 DIAGNOSIS — E785 Hyperlipidemia, unspecified: Secondary | ICD-10-CM | POA: Insufficient documentation

## 2020-02-14 DIAGNOSIS — M109 Gout, unspecified: Secondary | ICD-10-CM | POA: Diagnosis not present

## 2020-02-14 DIAGNOSIS — T82898A Other specified complication of vascular prosthetic devices, implants and grafts, initial encounter: Secondary | ICD-10-CM | POA: Diagnosis not present

## 2020-02-14 DIAGNOSIS — Z87891 Personal history of nicotine dependence: Secondary | ICD-10-CM | POA: Insufficient documentation

## 2020-02-14 DIAGNOSIS — E1122 Type 2 diabetes mellitus with diabetic chronic kidney disease: Secondary | ICD-10-CM | POA: Diagnosis not present

## 2020-02-14 DIAGNOSIS — Z87442 Personal history of urinary calculi: Secondary | ICD-10-CM | POA: Diagnosis not present

## 2020-02-14 DIAGNOSIS — I69398 Other sequelae of cerebral infarction: Secondary | ICD-10-CM | POA: Insufficient documentation

## 2020-02-14 DIAGNOSIS — E669 Obesity, unspecified: Secondary | ICD-10-CM | POA: Diagnosis not present

## 2020-02-14 DIAGNOSIS — N2581 Secondary hyperparathyroidism of renal origin: Secondary | ICD-10-CM | POA: Insufficient documentation

## 2020-02-14 DIAGNOSIS — X58XXXA Exposure to other specified factors, initial encounter: Secondary | ICD-10-CM | POA: Diagnosis not present

## 2020-02-14 DIAGNOSIS — I255 Ischemic cardiomyopathy: Secondary | ICD-10-CM | POA: Diagnosis not present

## 2020-02-14 DIAGNOSIS — N186 End stage renal disease: Secondary | ICD-10-CM | POA: Insufficient documentation

## 2020-02-14 DIAGNOSIS — Z794 Long term (current) use of insulin: Secondary | ICD-10-CM | POA: Insufficient documentation

## 2020-02-14 HISTORY — PX: REVISON OF ARTERIOVENOUS FISTULA: SHX6074

## 2020-02-14 HISTORY — DX: Other specified postprocedural states: R11.2

## 2020-02-14 HISTORY — DX: Presence of dental prosthetic device (complete) (partial): Z97.2

## 2020-02-14 LAB — POCT I-STAT, CHEM 8
BUN: 34 mg/dL — ABNORMAL HIGH (ref 8–23)
Calcium, Ion: 1.13 mmol/L — ABNORMAL LOW (ref 1.15–1.40)
Chloride: 95 mmol/L — ABNORMAL LOW (ref 98–111)
Creatinine, Ser: 6.7 mg/dL — ABNORMAL HIGH (ref 0.61–1.24)
Glucose, Bld: 60 mg/dL — ABNORMAL LOW (ref 70–99)
HCT: 35 % — ABNORMAL LOW (ref 39.0–52.0)
Hemoglobin: 11.9 g/dL — ABNORMAL LOW (ref 13.0–17.0)
Potassium: 3.3 mmol/L — ABNORMAL LOW (ref 3.5–5.1)
Sodium: 139 mmol/L (ref 135–145)
TCO2: 33 mmol/L — ABNORMAL HIGH (ref 22–32)

## 2020-02-14 LAB — GLUCOSE, CAPILLARY
Glucose-Capillary: 82 mg/dL (ref 70–99)
Glucose-Capillary: 92 mg/dL (ref 70–99)
Glucose-Capillary: 75 mg/dL (ref 70–99)
Glucose-Capillary: 96 mg/dL (ref 70–99)
Glucose-Capillary: 63 mg/dL — ABNORMAL LOW (ref 70–99)
Glucose-Capillary: 77 mg/dL (ref 70–99)
Glucose-Capillary: 104 mg/dL — ABNORMAL HIGH (ref 70–99)
Glucose-Capillary: 81 mg/dL (ref 70–99)

## 2020-02-14 SURGERY — REVISON OF ARTERIOVENOUS FISTULA
Anesthesia: General | Site: Arm Lower | Laterality: Right

## 2020-02-14 MED ORDER — LIDOCAINE HCL (CARDIAC) PF 100 MG/5ML IV SOSY
PREFILLED_SYRINGE | INTRAVENOUS | Status: DC | PRN
  Administered 2020-02-14: 80 mg via INTRATRACHEAL

## 2020-02-14 MED ORDER — DEXTROSE 50 % IV SOLN
25.0000 mL | Freq: Once | INTRAVENOUS | Status: AC
  Administered 2020-02-14: 25 mL via INTRAVENOUS

## 2020-02-14 MED ORDER — CHLORHEXIDINE GLUCONATE 4 % EX LIQD: 60.0000 mL | Freq: Once | CUTANEOUS | Status: DC

## 2020-02-14 MED ORDER — CHLORHEXIDINE GLUCONATE 0.12 % MT SOLN
OROMUCOSAL | Status: AC
  Administered 2020-02-14: 15 mL
  Filled 2020-02-14: qty 15

## 2020-02-14 MED ORDER — OXYCODONE-ACETAMINOPHEN 10-325 MG PO TABS
1.0000 | ORAL_TABLET | Freq: Four times a day (QID) | ORAL | 0 refills | Status: DC | PRN
Start: 2020-02-14 — End: 2020-07-29

## 2020-02-14 MED ORDER — ONDANSETRON HCL 4 MG/2ML IJ SOLN
INTRAMUSCULAR | Status: DC | PRN
  Administered 2020-02-14: 4 mg via INTRAVENOUS

## 2020-02-14 MED ORDER — SODIUM CHLORIDE 0.9 % IV SOLN
INTRAVENOUS | Status: AC
  Filled 2020-02-14: qty 1.2

## 2020-02-14 MED ORDER — DEXTROSE 50 % IV SOLN
INTRAVENOUS | Status: AC
  Administered 2020-02-14: 25 mL via INTRAVENOUS
  Filled 2020-02-14: qty 50

## 2020-02-14 MED ORDER — PROPOFOL 10 MG/ML IV BOLUS
INTRAVENOUS | Status: DC | PRN
  Administered 2020-02-14: 140 mg via INTRAVENOUS
  Administered 2020-02-14: 20 mg via INTRAVENOUS

## 2020-02-14 MED ORDER — PHENYLEPHRINE 40 MCG/ML (10ML) SYRINGE FOR IV PUSH (FOR BLOOD PRESSURE SUPPORT)
PREFILLED_SYRINGE | INTRAVENOUS | Status: AC
  Filled 2020-02-14: qty 10

## 2020-02-14 MED ORDER — EPHEDRINE SULFATE 50 MG/ML IJ SOLN
INTRAMUSCULAR | Status: DC | PRN
  Administered 2020-02-14: 10 mg via INTRAVENOUS

## 2020-02-14 MED ORDER — DEXTROSE 50 % IV SOLN: 25.0000 mL | Freq: Once | INTRAVENOUS | Status: AC

## 2020-02-14 MED ORDER — DEXTROSE 50 % IV SOLN
INTRAVENOUS | Status: AC
  Filled 2020-02-14: qty 50

## 2020-02-14 MED ORDER — PROPOFOL 10 MG/ML IV BOLUS
INTRAVENOUS | Status: AC
  Filled 2020-02-14: qty 20

## 2020-02-14 MED ORDER — 0.9 % SODIUM CHLORIDE (POUR BTL) OPTIME
TOPICAL | Status: DC | PRN
  Administered 2020-02-14: 1000 mL

## 2020-02-14 MED ORDER — FENTANYL CITRATE (PF) 100 MCG/2ML IJ SOLN
25.0000 ug | INTRAMUSCULAR | Status: DC | PRN
  Administered 2020-02-14: 25 ug via INTRAVENOUS

## 2020-02-14 MED ORDER — FENTANYL CITRATE (PF) 100 MCG/2ML IJ SOLN
INTRAMUSCULAR | Status: DC | PRN
Start: 2020-02-14 — End: 2020-02-14
  Administered 2020-02-14 (×4): 25 ug via INTRAVENOUS

## 2020-02-14 MED ORDER — SODIUM CHLORIDE 0.9 % IV SOLN: INTRAVENOUS | Status: DC

## 2020-02-14 MED ORDER — PHENYLEPHRINE HCL-NACL 10-0.9 MG/250ML-% IV SOLN
INTRAVENOUS | Status: DC | PRN
  Administered 2020-02-14: 50 ug/min via INTRAVENOUS

## 2020-02-14 MED ORDER — FENTANYL CITRATE (PF) 100 MCG/2ML IJ SOLN
INTRAMUSCULAR | Status: DC
Start: 2020-02-14 — End: 2020-02-14
  Filled 2020-02-14: qty 2

## 2020-02-14 MED ORDER — FENTANYL CITRATE (PF) 250 MCG/5ML IJ SOLN
INTRAMUSCULAR | Status: AC
  Filled 2020-02-14: qty 5

## 2020-02-14 MED ORDER — SODIUM CHLORIDE 0.9 % IV SOLN: INTRAVENOUS | Status: DC | PRN

## 2020-02-14 MED ORDER — LIDOCAINE HCL (PF) 1 % IJ SOLN
INTRAMUSCULAR | Status: AC
  Filled 2020-02-14: qty 30

## 2020-02-14 SURGICAL SUPPLY — 36 items
ARMBAND PINK RESTRICT EXTREMIT (MISCELLANEOUS) ×2 IMPLANT
CANISTER SUCT 3000ML PPV (MISCELLANEOUS) ×2 IMPLANT
CLIP VESOCCLUDE MED 6/CT (CLIP) ×2 IMPLANT
CLIP VESOCCLUDE SM WIDE 6/CT (CLIP) ×2 IMPLANT
COVER PROBE W GEL 5X96 (DRAPES) ×2 IMPLANT
COVER WAND RF STERILE (DRAPES) IMPLANT
DECANTER SPIKE VIAL GLASS SM (MISCELLANEOUS) ×2 IMPLANT
DERMABOND ADVANCED (GAUZE/BANDAGES/DRESSINGS) ×1
DERMABOND ADVANCED .7 DNX12 (GAUZE/BANDAGES/DRESSINGS) ×1 IMPLANT
ELECT REM PT RETURN 9FT ADLT (ELECTROSURGICAL) ×2
ELECTRODE REM PT RTRN 9FT ADLT (ELECTROSURGICAL) ×1 IMPLANT
GLOVE BIO SURGEON STRL SZ7.5 (GLOVE) ×2 IMPLANT
GLOVE BIOGEL PI IND STRL 8 (GLOVE) ×1 IMPLANT
GLOVE BIOGEL PI INDICATOR 8 (GLOVE) ×1
GOWN STRL REUS W/ TWL LRG LVL3 (GOWN DISPOSABLE) ×2 IMPLANT
GOWN STRL REUS W/ TWL XL LVL3 (GOWN DISPOSABLE) ×2 IMPLANT
GOWN STRL REUS W/TWL LRG LVL3 (GOWN DISPOSABLE) ×4
GOWN STRL REUS W/TWL XL LVL3 (GOWN DISPOSABLE) ×4
HEMOSTAT SPONGE AVITENE ULTRA (HEMOSTASIS) IMPLANT
KIT BASIN OR (CUSTOM PROCEDURE TRAY) ×2 IMPLANT
KIT TURNOVER KIT B (KITS) ×2 IMPLANT
NS IRRIG 1000ML POUR BTL (IV SOLUTION) ×2 IMPLANT
PACK CV ACCESS (CUSTOM PROCEDURE TRAY) ×2 IMPLANT
PAD ARMBOARD 7.5X6 YLW CONV (MISCELLANEOUS) ×4 IMPLANT
STAPLER VISISTAT 35W (STAPLE) IMPLANT
SUT MNCRL AB 4-0 PS2 18 (SUTURE) ×2 IMPLANT
SUT PROLENE 5 0 C 1 24 (SUTURE) ×2 IMPLANT
SUT PROLENE 6 0 BV (SUTURE) ×2 IMPLANT
SUT PROLENE 7 0 BV 1 (SUTURE) IMPLANT
SUT VIC AB 3-0 SH 27 (SUTURE) ×2
SUT VIC AB 3-0 SH 27X BRD (SUTURE) ×1 IMPLANT
SYR TOOMEY IRRIG 70ML (MISCELLANEOUS) ×2
SYRINGE TOOMEY IRRIG 70ML (MISCELLANEOUS) ×1 IMPLANT
TOWEL GREEN STERILE (TOWEL DISPOSABLE) ×2 IMPLANT
UNDERPAD 30X36 HEAVY ABSORB (UNDERPADS AND DIAPERS) ×2 IMPLANT
WATER STERILE IRR 1000ML POUR (IV SOLUTION) ×2 IMPLANT

## 2020-02-14 NOTE — Discharge Instructions (Signed)
° °  [facility] ° °Discharge Instructions ° °AV Fistula or Graft Surgery for Dialysis Access ° °Please refer to the following instructions for your post-procedure care. Your surgeon or physician assistant will discuss any changes with you. ° °Activity ° °You may drive the day following your surgery, if you are comfortable and no longer taking prescription pain medication. Resume full activity as the soreness in your incision resolves. ° °Bathing/Showering ° °You may shower after you go home. Keep your incision dry for 48 hours. Do not soak in a bathtub, hot tub, or swim until the incision heals completely. You may not shower if you have a hemodialysis catheter. ° °Incision Care ° °Clean your incision with mild soap and water after 48 hours. Pat the area dry with a clean towel. You do not need a bandage unless otherwise instructed. Do not apply any ointments or creams to your incision. You may have skin glue on your incision. Do not peel it off. It will come off on its own in about one week. Your arm may swell a bit after surgery. To reduce swelling use pillows to elevate your arm so it is above your heart. Your doctor will tell you if you need to lightly wrap your arm with an ACE bandage. ° °Diet ° °Resume your normal diet. There are not special food restrictions following this procedure. In order to heal from your surgery, it is CRITICAL to get adequate nutrition. Your body requires vitamins, minerals, and protein. Vegetables are the best source of vitamins and minerals. Vegetables also provide the perfect balance of protein. Processed food has little nutritional value, so try to avoid this. ° °Medications ° °Resume taking all of your medications. If your incision is causing pain, you may take over-the counter pain relievers such as acetaminophen (Tylenol). If you were prescribed a stronger pain medication, please be aware these medications can cause nausea and constipation. Prevent  nausea by taking the medication with a snack or meal. Avoid constipation by drinking plenty of fluids and eating foods with high amount of fiber, such as fruits, vegetables, and grains. Do not take Tylenol if you are taking prescription pain medications. ° ° ° ° °Follow up °Your surgeon may want to see you in the office following your access surgery. If so, this will be arranged at the time of your surgery. ° °Please call us immediately for any of the following conditions: ° °Increased pain, redness, drainage (pus) from your incision site °Fever of 101 degrees or higher °Severe or worsening pain at your incision site °Hand pain or numbness. ° °Reduce your risk of vascular disease: ° °Stop smoking. If you would like help, call [facility] at 1-800-QUIT-NOW (1-800-784-8669) or [place] at 336-586-4000 ° °Manage your cholesterol °Maintain a desired weight °Control your diabetes °Keep your blood pressure down ° °Dialysis ° °It will take several weeks to several months for your new dialysis access to be ready for use. Your surgeon will determine when it is OK to use it. Your nephrologist will continue to direct your dialysis. You can continue to use your Permcath until your new access is ready for use. ° °If you have any questions, please call the office at [phone]. ° °

## 2020-02-14 NOTE — H&P (Signed)
History and Physical Interval Note:  02/14/2020 12:34 PM  [name]  has presented today for surgery, with the diagnosis of COMPLICATION OF ARTERIOVENOUS FISTULA.  The various methods of treatment have been discussed with the patient and family. After consideration of risks, benefits and other options for treatment, the patient has consented to  Procedure(s): RIGHT ARM ARTERIOVENOUS FISTULA REVISON WITH SIDE BRANCH LIGATION VERSUS CONVERSION TO UPPER ARM FISTULA (Right) as a surgical intervention.  The patient's history has been reviewed, patient examined, no change in status, stable for surgery.  I have reviewed the patient's chart and labs.  Questions were answered to the patient's satisfaction.    Scheduled for sidebranch ligation of the right forearm fistula to facilitate maturation versus conversion to upper arm fistula.  Marty Heck  CC:  F/u for surgery  HPI:  This is a 69 y.o. male who is s/p right forearm AV fistula 05/2019.  He was last seen 07/10/2019 and not yet on HD.    Pt returns today for follow up secondary to starting HD and having difficult cannulation per Dr. Augustin Coupe.  Since then he has had a TDC placed by Dr. Augustin Coupe.  He has HD M-W-F.   Past Medical History:  Diagnosis Date  . Acute respiratory failure ([place]) 10/04/2017  . Anemia   . Anemia in chronic kidney disease 09/29/2019  . Arthritis of right knee 10/05/2017  . CAD (coronary artery disease), native coronary artery 11/15/2013   70-80% mid RCA with marked ectasia.   . Cardiorenal syndrome with renal failure 10/04/2017  . Carotid aneurysm, left ([place]) 11/15/2013  . CHF (congestive heart failure) ([place])   . CKD (chronic kidney disease), stage IV ([place]) 07/20/2016  . CKD (chronic kidney disease), stage V ([place]) 03/27/2019  . Coagulation defect, unspecified ([place]) 09/29/2019  . Coronary artery disease    a. LHC 04/27/10 left main patent, 30-40% LAD, mid circumflex 30%, 80% mid RCA accounting for the appearance of an  infract/peri-infract ischemia on nuclear testing, LV EF of 30-45%--> medical therapy  . CVA (cerebrovascular accident) ([place]) 2011   potine; "little balance problems since" (05/25/2016)  . Dyspnea   . Elevated troponin 10/04/2017  . End stage renal disease ([place]) 09/29/2019  . Erectile dysfunction 11/15/2013  . Essential hypertension 11/15/2013  . Gout   . Gout, unspecified 10/04/2019  . Hematuria 10/05/2017  . History of kidney stones    "passed them" (05/25/2016)  . Hyperlipidemia   . Hypertension   . Iron deficiency anemia, unspecified 09/29/2019  . LBBB (left bundle branch block)   . 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 Multinodular goiter   . Obesity   . OSA (obstructive sleep apnea) 11/15/2013  . OSA on CPAP   . PONV (postoperative nausea and vomiting)    diarrhea  . Presence of permanent cardiac pacemaker   . Renal insufficiency   . Right upper lobe pneumonia 10/04/2017  . Secondary hyperparathyroidism of renal origin ([place]) 09/29/2019  . Type 2 diabetes mellitus with nephropathy ([place]) 10/05/2017  . Type II diabetes mellitus ([place])   . Unspecified sequelae of cerebral infarction 10/04/2019  . Wears partial dentures     Past Surgical History:  Procedure Laterality Date  . A/V FISTULAGRAM Right 01/31/2020   Procedure: A/V FISTULAGRAM;  Surgeon: Marty Heck, MD;  Location: [facility];  Service: Cardiovascular;  Laterality: Right;  . AV FISTULA PLACEMENT Right 05/25/2019   Procedure: ARTERIOVENOUS (AV) FISTULA CREATION RIGHT ARM;  Surgeon: Carlis Abbott,  Gwenyth Allegra, MD;  Location: [facility];  Service: Vascular;  Laterality: Right;  . COLONOSCOPY WITH PROPOFOL N/A 04/16/2014   Procedure: COLONOSCOPY WITH PROPOFOL;  Surgeon: [name], MD;  Location: [facility];  Service: Endoscopy;  Laterality: N/A;  . EP IMPLANTABLE DEVICE N/A 05/25/2016   Procedure: BiV ICD Insertion CRT-D;  Surgeon: [name], MD;  Location: [facility];  Service: Cardiovascular;   Laterality: N/A;  . INSERT / REPLACE / REMOVE PACEMAKER  05/25/2016   biventricular pacemaker  . LAPAROSCOPIC CHOLECYSTECTOMY       No Known Allergies        Current Outpatient Medications  Medication Sig Dispense Refill  . allopurinol (ZYLOPRIM) 300 MG tablet Take 300 mg by mouth daily.    Marland Kitchen amLODipine-benazepril (LOTREL) 10-40 MG per capsule Take 1 capsule by mouth every morning.     Marland Kitchen aspirin EC 81 MG tablet Take 81 mg by mouth at bedtime.    . carvedilol (COREG) 25 MG tablet Take 25 mg by mouth 2 (two) times daily with a meal. Takes only 1 tablet on HD days    . furosemide (LASIX) 80 MG tablet Take 80 mg by mouth 2 (two) times daily.     Marland Kitchen HUMALOG KWIKPEN 100 UNIT/ML KiwkPen Inject 14 Units into the skin daily after breakfast.   5  . insulin glargine (LANTUS) 100 UNIT/ML injection Inject 44 Units into the skin at bedtime.     . multivitamin (RENA-VIT) TABS tablet Take 1 tablet by mouth daily.    . NON FORMULARY CPAP: At bedtime    . rosuvastatin (CRESTOR) 20 MG tablet Take 20 mg by mouth daily.    . sevelamer carbonate (RENVELA) 800 MG tablet Take 1,600 mg by mouth 3 (three) times daily with meals. Also takes 1 tablet before snack     No current facility-administered medications for this visit.     ROS:  See HPI  Physical Exam:   Findings:  +--------------------+----------+-----------------+--------+  AVF         PSV (cm/s)Flow Vol (mL/min)Comments  +--------------------+----------+-----------------+--------+  Native artery inflow  348      621          +--------------------+----------+-----------------+--------+  AVF Anastomosis     510                 +--------------------+----------+-----------------+--------+     +------------+----------+-------------+----------+-------------------------  ----+  OUTFLOW VEINPSV (cm/s)Diameter (cm)Depth (cm)     Describe         +------------+----------+-------------+----------+-------------------------  ----+  Mid UA     39    0.45     0.49                   +------------+----------+-------------+----------+-------------------------  ----+  Dist UA    21 / 38  0.68 / 0.54  0.23 /                                      0.34                   +------------+----------+-------------+----------+-------------------------  ----+  AC Fossa    112    0.53     0.35                   +------------+----------+-------------+----------+-------------------------  ----+  Prox Forearm  110    0.74     0.25  competing branch  0.21    +------------+----------+-------------+----------+-------------------------  ----+  Mid Forearm   126    0.63     0.42  competing branch 0.14 21  cm/s  +------------+----------+-------------+----------+-------------------------  ----+  Dist Forearm  346    0.47     0.31                   +------------+----------+-------------+----------+-------------------------  ----+        Summary:  Patent arteriovenous fistula with no visualized stenosis. Decreased peak  systolic noted in the mid to distal upper arm.    Incision:  Incision well healed with palpable thrill to [facility].  The fistula is visible half way up his forearm.  Extremities:  Palpable radial pulse, sensation and grip intact. heart: RRR Abdomen:  Soft NTTP Lungs: clear to auscultation . Assessment/Plan:  This is a 69 y.o. male who is s/p:right forearm radial cephalic fistula creation which is now  Malfunctioning and not fully matured.               His UE cephalic has developed some since the fistula has been created.               I discussed the duplex results of competing branches and un maturation size < 0.6 cm with Dr. Donnetta Hutching and  we have decided to schedule him for fistulogram with possible intervention verses new fistula creation.  If we can't intervene then he has an acceptable basilic on the right according to the vein mapping.    Roxy Horseman PA-C [facility] [phone]  Clinic MD:  Early

## 2020-02-14 NOTE — Anesthesia Procedure Notes (Signed)
Procedure Name: LMA Insertion Date/Time: 02/14/2020 1:40 PM Performed by: Lavell Luster, CRNA Pre-anesthesia Checklist: Patient identified, Emergency Drugs available, Suction available, Patient being monitored and Timeout performed Patient Re-evaluated:Patient Re-evaluated prior to induction Oxygen Delivery Method: Circle system utilized Preoxygenation: Pre-oxygenation with 100% oxygen Induction Type: IV induction Ventilation: Mask ventilation without difficulty LMA: LMA inserted LMA Size: 5.0 Placement Confirmation: breath sounds checked- equal and bilateral and positive ETCO2 Tube secured with: Tape Dental Injury: Teeth and Oropharynx as per pre-operative assessment

## 2020-02-14 NOTE — Transfer of Care (Signed)
Immediate Anesthesia Transfer of Care Note  Patient: Matthew Wood.  Procedure(s) Performed: RIGHT ARM ARTERIOVENOUS FISTULA REVISON WITH SIDE BRANCH LIGATION VERSUS CONVERSION TO UPPER ARM FISTULA (Right Arm Lower)  Patient Location: PACU  Anesthesia Type:General  Level of Consciousness: awake and patient cooperative  Airway & Oxygen Therapy: Patient Spontanous Breathing  Post-op Assessment: Report given to RN and Post -op Vital signs reviewed and stable  Post vital signs: Reviewed and stable  Last Vitals:  Vitals Value Taken Time  BP    Temp    Pulse    Resp    SpO2      Last Pain:  Vitals:   02/14/20 1109  TempSrc:   PainSc: 0-No pain         Complications: No complications documented.

## 2020-02-14 NOTE — Progress Notes (Signed)
CBG on Istat is 60.   Dr. Ermalene Postin notified.  Per Dr. Ermalene Postin, give patient 1/2 amp of Dextrose.  Will continue to monitor

## 2020-02-14 NOTE — Progress Notes (Signed)
Wasted 75 mcg of Fentanyl with Jerene Bears, RN.

## 2020-02-14 NOTE — Op Note (Addendum)
Date: February 14, 2020  Operative diagnosis: Slow to mature right radiocephalic AV fistula  Postoperative diagnosis: Same  Procedure: Right radiocephalic AV fistula revision with sidebranch ligation x5  Surgeon: Dr. Marty Heck, MD  Assistant: Risa Grill, PA  Indications: Patient is a 68 year old male who had a right radiocephalic AV fistula placed in December 2020.  He subsequently has progressed to end-stage renal disease and recently presented for follow-up with difficult cannulation per nephrology.  Fistula remained smaller in the forearm and he underwent fistulogram.  We discussed options of sidebranch ligation versus conversion to upper arm fistula.  Ultimately we have agreed on sidebranch ligation to see if we can facilitate further maturation of the fistula.  An assistant was needed to facilitate exposure.  Findings: At least 5 side branches were ligated in the right forearm several or which were fairly large.  Patient had an excellent thrill upon completion.  Fistula appears to be superficial enough for use.  If this does not work discussed conversion to upper arm fistula.  Anesthesia: LMA  Details: Patient was taken to the operating room after informed consent was obtained.  Placed on operative table in supine position.  Anesthesia was subsequently induced.  The right arm was prepped draped in usual sterile fashion.  Timeout was performed to identify patient, procedure and site.  Initially sterile ultrasound was used to evaluate the radiocephalic fistula in the right forearm and multiple side branches were identified and marked on the forearm skin surface.  Subsequently used scalpel and three small longitudinal incisions that were made over the fistula side-branches.   We dissected down with Bovie cautery Metzenbaum scissors and side branches were identified and ligated between 3-0 silk ties and divided.  Patient had an excellent thrill upon completion.  Incisions were  irrigated out and closed with 4-0 Monocryl.  Complication: None  Condition: Stable  Marty Heck, MD [facility] Office: [place]

## 2020-02-15 ENCOUNTER — Encounter (HOSPITAL_COMMUNITY): Payer: Self-pay | Admitting: Vascular Surgery

## 2020-02-21 NOTE — Anesthesia Postprocedure Evaluation (Signed)
Anesthesia Post Note  Patient: Verl Kitson.  Procedure(s) Performed: RIGHT ARM ARTERIOVENOUS FISTULA REVISON WITH SIDE BRANCH LIGATION VERSUS CONVERSION TO UPPER ARM FISTULA (Right Arm Lower)     Patient location during evaluation: PACU Anesthesia Type: General Level of consciousness: patient cooperative and awake Pain management: pain level controlled Vital Signs Assessment: post-procedure vital signs reviewed and stable Respiratory status: spontaneous breathing, nonlabored ventilation, respiratory function stable and patient connected to nasal cannula oxygen Cardiovascular status: blood pressure returned to baseline and stable Postop Assessment: no apparent nausea or vomiting Anesthetic complications: no   No complications documented.  Last Vitals:  Vitals:   02/14/20 1555 02/14/20 1610  BP: (!) 110/57 (!) 110/56  Pulse: 60 60  Resp: 12 12  Temp:  36.8 C  SpO2: 97% 100%    Last Pain:  Vitals:   02/14/20 1610  TempSrc:   PainSc: 0-No pain                 Sara Selvidge

## 2020-03-03 ENCOUNTER — Ambulatory Visit: Payer: Medicare Other | Admitting: Nurse Practitioner

## 2020-03-05 ENCOUNTER — Ambulatory Visit (INDEPENDENT_AMBULATORY_CARE_PROVIDER_SITE_OTHER): Payer: Medicare Other | Admitting: *Deleted

## 2020-03-05 ENCOUNTER — Other Ambulatory Visit: Payer: Self-pay

## 2020-03-05 DIAGNOSIS — I429 Cardiomyopathy, unspecified: Secondary | ICD-10-CM | POA: Diagnosis not present

## 2020-03-05 DIAGNOSIS — N185 Chronic kidney disease, stage 5: Secondary | ICD-10-CM

## 2020-03-05 LAB — CUP PACEART REMOTE DEVICE CHECK
Battery Remaining Longevity: 34 mo
Battery Voltage: 2.95 V
Brady Statistic AP VP Percent: 29.37 %
Brady Statistic AP VS Percent: 0.17 %
Brady Statistic AS VP Percent: 68.65 %
Brady Statistic AS VS Percent: 1.81 %
Brady Statistic RA Percent Paced: 28.87 %
Brady Statistic RV Percent Paced: 92.43 %
Date Time Interrogation Session: 20210922033426
HighPow Impedance: 60 Ohm
Implantable Lead Implant Date: 20171212
Implantable Lead Implant Date: 20171212
Implantable Lead Implant Date: 20171212
Implantable Lead Location: 753858
Implantable Lead Location: 753859
Implantable Lead Location: 753860
Implantable Lead Model: 4298
Implantable Lead Model: 5076
Implantable Pulse Generator Implant Date: 20171212
Lead Channel Impedance Value: 118.56 Ohm
Lead Channel Impedance Value: 123.5 Ohm
Lead Channel Impedance Value: 126.667
Lead Channel Impedance Value: 132.321
Lead Channel Impedance Value: 132.321
Lead Channel Impedance Value: 228 Ohm
Lead Channel Impedance Value: 247 Ohm
Lead Channel Impedance Value: 247 Ohm
Lead Channel Impedance Value: 247 Ohm
Lead Channel Impedance Value: 285 Ohm
Lead Channel Impedance Value: 304 Ohm
Lead Channel Impedance Value: 361 Ohm
Lead Channel Impedance Value: 399 Ohm
Lead Channel Impedance Value: 399 Ohm
Lead Channel Impedance Value: 418 Ohm
Lead Channel Impedance Value: 418 Ohm
Lead Channel Impedance Value: 418 Ohm
Lead Channel Impedance Value: 456 Ohm
Lead Channel Pacing Threshold Amplitude: 0.5 V
Lead Channel Pacing Threshold Amplitude: 0.75 V
Lead Channel Pacing Threshold Amplitude: 0.875 V
Lead Channel Pacing Threshold Pulse Width: 0.4 ms
Lead Channel Pacing Threshold Pulse Width: 0.4 ms
Lead Channel Pacing Threshold Pulse Width: 0.4 ms
Lead Channel Sensing Intrinsic Amplitude: 13 mV
Lead Channel Sensing Intrinsic Amplitude: 13 mV
Lead Channel Sensing Intrinsic Amplitude: 4 mV
Lead Channel Sensing Intrinsic Amplitude: 4 mV
Lead Channel Setting Pacing Amplitude: 1.25 V
Lead Channel Setting Pacing Amplitude: 2 V
Lead Channel Setting Pacing Amplitude: 2.5 V
Lead Channel Setting Pacing Pulse Width: 0.4 ms
Lead Channel Setting Pacing Pulse Width: 0.4 ms
Lead Channel Setting Sensing Sensitivity: 0.3 mV

## 2020-03-07 NOTE — Progress Notes (Signed)
Remote ICD transmission.   

## 2020-03-17 ENCOUNTER — Telehealth: Payer: Self-pay

## 2020-03-17 NOTE — Telephone Encounter (Signed)
Patient called with questions about appt on 10/5. Assured patient he could eat prior to arm duplex and about what to expect about an u/s and follow up s/p arm revision.

## 2020-03-18 ENCOUNTER — Ambulatory Visit (INDEPENDENT_AMBULATORY_CARE_PROVIDER_SITE_OTHER): Payer: Self-pay | Admitting: Vascular Surgery

## 2020-03-18 ENCOUNTER — Encounter: Payer: Self-pay | Admitting: Vascular Surgery

## 2020-03-18 ENCOUNTER — Ambulatory Visit (HOSPITAL_COMMUNITY)
Admission: RE | Admit: 2020-03-18 | Discharge: 2020-03-18 | Disposition: A | Discharge status: Home / Self Care | Payer: Medicare Other | Source: Ambulatory Visit | Attending: Vascular Surgery | Admitting: Vascular Surgery

## 2020-03-18 ENCOUNTER — Other Ambulatory Visit: Payer: Self-pay

## 2020-03-18 VITALS — BP 132/74 | HR 64 | Temp 97.7°F | Resp 18 | Ht 71.0 in | Wt 260.0 lb

## 2020-03-18 DIAGNOSIS — N185 Chronic kidney disease, stage 5: Secondary | ICD-10-CM | POA: Diagnosis not present

## 2020-03-18 DIAGNOSIS — N186 End stage renal disease: Secondary | ICD-10-CM

## 2020-03-18 NOTE — Progress Notes (Signed)
Patient name: Matthew Wood. MRN: [id number] DOB: 05/24/1951 Sex: male  REASON FOR VISIT: Postop check after right arm AV fistula revision  HPI: Matthew Wood. is a 69 y.o. male with end-stage renal disease that presents for postop check after revision of right radiocephalic AV fistula. He had a right radiocephalic AVF placed on 40/08/67 for stage V CKD.  Ultimately went on to ESRD in April of this year with slow to mature fistula.  Ultimately underwent sidebranch ligation x5 on 02/14/20.  Ultimately his incisions have healed without issue.  Past Medical History:  Diagnosis Date  . Acute respiratory failure ([place]) 10/04/2017  . Anemia   . Anemia in chronic kidney disease 09/29/2019  . Arthritis of right knee 10/05/2017  . CAD (coronary artery disease), native coronary artery 11/15/2013   70-80% mid RCA with marked ectasia.   . Cardiorenal syndrome with renal failure 10/04/2017  . Carotid aneurysm, left ([place]) 11/15/2013  . CHF (congestive heart failure) ([place])   . CKD (chronic kidney disease), stage IV ([place]) 07/20/2016  . CKD (chronic kidney disease), stage V ([place]) 03/27/2019  . Coagulation defect, unspecified ([place]) 09/29/2019  . Coronary artery disease    a. LHC 04/27/10 left main patent, 30-40% LAD, mid circumflex 30%, 80% mid RCA accounting for the appearance of an infract/peri-infract ischemia on nuclear testing, LV EF of 30-45%--> medical therapy  . CVA (cerebrovascular accident) ([place]) 2011   potine; "little balance problems since" (05/25/2016)  . Dyspnea   . Elevated troponin 10/04/2017  . End stage renal disease ([place]) 09/29/2019  . Erectile dysfunction 11/15/2013  . Essential hypertension 11/15/2013  . Gout   . Gout, unspecified 10/04/2019  . Hematuria 10/05/2017  . History of kidney stones    "passed them" (05/25/2016)  . Hyperlipidemia   . Hypertension   . Iron deficiency anemia, unspecified 09/29/2019  . LBBB (left bundle branch block)   . 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  Multinodular goiter   . Obesity   . OSA (obstructive sleep apnea) 11/15/2013  . OSA on CPAP   . PONV (postoperative nausea and vomiting)    diarrhea  . Presence of permanent cardiac pacemaker   . Renal insufficiency   . Right upper lobe pneumonia 10/04/2017  . Secondary hyperparathyroidism of renal origin ([place]) 09/29/2019  . Type 2 diabetes mellitus with nephropathy ([place]) 10/05/2017  . Type II diabetes mellitus ([place])   . Unspecified sequelae of cerebral infarction 10/04/2019  . Wears partial dentures     Past Surgical History:  Procedure Laterality Date  . A/V FISTULAGRAM Right 01/31/2020   Procedure: A/V FISTULAGRAM;  Surgeon: Marty Heck, MD;  Location: [facility];  Service: Cardiovascular;  Laterality: Right;  . AV FISTULA PLACEMENT Right 05/25/2019   Procedure: ARTERIOVENOUS (AV) FISTULA CREATION RIGHT ARM;  Surgeon: Marty Heck, MD;  Location: [facility];  Service: Vascular;  Laterality: Right;  . COLONOSCOPY WITH PROPOFOL N/A 04/16/2014   Procedure: COLONOSCOPY WITH PROPOFOL;  Surgeon: [name], MD;  Location: [facility];  Service: Endoscopy;  Laterality: N/A;  . EP IMPLANTABLE DEVICE N/A 05/25/2016   Procedure: BiV ICD Insertion CRT-D;  Surgeon: [name], MD;  Location: [facility];  Service: Cardiovascular;  Laterality: N/A;  . INSERT / REPLACE / REMOVE PACEMAKER  05/25/2016   biventricular pacemaker  . LAPAROSCOPIC CHOLECYSTECTOMY    . REVISON OF ARTERIOVENOUS FISTULA Right 02/14/2020   Procedure: RIGHT ARM ARTERIOVENOUS FISTULA REVISON WITH SIDE  BRANCH LIGATION VERSUS CONVERSION TO UPPER ARM FISTULA;  Surgeon: Marty Heck, MD;  Location: [facility];  Service: Vascular;  Laterality: Right;    Family History  Problem Relation Age of Onset  . Hypertension Mother   . Breast cancer Mother   . Heart attack Father   . Stroke Father   . Diabetes type II Father   . Hypertension Sister     SOCIAL HISTORY: Social History    Tobacco Use  . Smoking status: Former Smoker    Packs/day: 0.50    Years: 27.00    Pack years: 13.50    Types: Cigarettes    Quit date: 03/25/2010    Years since quitting: 9.9  . Smokeless tobacco: Never Used  Substance Use Topics  . Alcohol use: Yes    Comment: rare    No Known Allergies  Current Outpatient Medications  Medication Sig Dispense Refill  . allopurinol (ZYLOPRIM) 300 MG tablet Take 300 mg by mouth daily.    Marland Kitchen amLODipine-benazepril (LOTREL) 10-40 MG per capsule Take 1 capsule by mouth daily.     Marland Kitchen aspirin EC 81 MG tablet Take 81 mg by mouth at bedtime.    Marland Kitchen b complex-vitamin c-folic acid (NEPHRO-VITE) 0.8 MG TABS tablet Take 1 tablet by mouth daily.     . carvedilol (COREG) 25 MG tablet Take 25 mg by mouth 2 (two) times daily with a meal. Takes only 1 tablet on HD days    . Doxercalciferol 1 MCG CAPS Take 1 mcg by mouth every Monday, Wednesday, and Friday.     . heparin 1000 unit/mL SOLN injection 5,000 Units by Dialysis route every Monday, Wednesday, and Friday.     Marland Kitchen HUMALOG KWIKPEN 100 UNIT/ML KiwkPen Inject 15 Units into the skin daily after breakfast.   5  . insulin glargine (LANTUS) 100 UNIT/ML injection Inject 28 Units into the skin at bedtime.     . NON FORMULARY CPAP: At bedtime    . rosuvastatin (CRESTOR) 20 MG tablet Take 20 mg by mouth daily.    . sevelamer carbonate (RENVELA) 800 MG tablet Take 800-2,400 mg by mouth See admin instructions. 2400 mg with meals. And 800 mg with snacks    . oxyCODONE-acetaminophen (PERCOCET) 10-325 MG tablet Take 1 tablet by mouth every 6 (six) hours as needed for pain. (Patient not taking: Reported on 03/18/2020) 12 tablet 0   Current Facility-Administered Medications  Medication Dose Route Frequency Provider Last Rate Last Admin  . 0.9 %  sodium chloride infusion  250 mL Intravenous PRN Marty Heck, MD        REVIEW OF SYSTEMS:  [X]  denotes positive finding, [ ]  denotes negative finding Cardiac  Comments:   Chest pain or chest pressure:    Shortness of breath upon exertion:    Short of breath when lying flat:    Irregular heart rhythm:        Vascular    Pain in calf, thigh, or hip brought on by ambulation:    Pain in feet at night that wakes you up from your sleep:     Blood clot in your veins:    Leg swelling:         Pulmonary    Oxygen at home:    Productive cough:     Wheezing:         Neurologic    Sudden weakness in arms or legs:     Sudden numbness in arms or legs:  Sudden onset of difficulty speaking or slurred speech:    Temporary loss of vision in one eye:     Problems with dizziness:         Gastrointestinal    Blood in stool:     Vomited blood:         Genitourinary    Burning when urinating:     Blood in urine:        Psychiatric    Major depression:         Hematologic    Bleeding problems:    Problems with blood clotting too easily:        Skin    Rashes or ulcers:        Constitutional    Fever or chills:      PHYSICAL EXAM: Vitals:   03/18/20 1516  BP: 132/74  Pulse: 64  Resp: 18  Temp: 97.7 F (36.5 C)  TempSrc: Temporal  SpO2: 99%  Weight: 260 lb (117.9 kg)  Height: 5\' 11"  (1.803 m)    GENERAL: The patient is a well-nourished male, in no acute distress. The vital signs are documented above. CARDIAC: There is a regular rate and rhythm.  VASCULAR:  Right radial pulse palpable Right arm AV fistula with good thrill Incisions well healed PULMONARY: There is good air exchange bilaterally without wheezing or rales. MUSCULOSKELETAL: There are no major deformities or cyanosis. NEUROLOGIC: No focal weakness or paresthesias are detected.   DATA:   AV fistula duplex of the right arm shows better flow volumes with now a 5 to 6 mm fistula in the forearm.  Assessment/Plan:  69 year old male with end-stage renal disease presents for follow-up for slow to mature right radiocephalic AVF after right AV fistula revision with sidebranch  ligation x5 on 02/14/20.  He really has an excellent thrill on exam and this feels very superficial to me.  Discussed giving him one more month allowing the incisions to completely heal and to ensure this is not infiltrated and then allow dialysis to attempt to use this.  He has been a bit frustrated given fistula creation with slow to mature so I will arrnage followup in 2 months just to see how he is doing and discussed if this fails would need conversion to an upper arm fistula.   Marty Heck, MD [facility] Office: [phone]

## 2020-05-20 ENCOUNTER — Ambulatory Visit: Payer: Medicare Other | Admitting: Vascular Surgery

## 2020-06-05 ENCOUNTER — Ambulatory Visit (INDEPENDENT_AMBULATORY_CARE_PROVIDER_SITE_OTHER): Payer: Medicare Other

## 2020-06-05 DIAGNOSIS — I429 Cardiomyopathy, unspecified: Secondary | ICD-10-CM | POA: Diagnosis not present

## 2020-06-05 LAB — CUP PACEART REMOTE DEVICE CHECK
Battery Remaining Longevity: 31 mo
Battery Voltage: 2.95 V
Brady Statistic AP VP Percent: 34.48 %
Brady Statistic AP VS Percent: 0.3 %
Brady Statistic AS VP Percent: 62.86 %
Brady Statistic AS VS Percent: 2.37 %
Brady Statistic RA Percent Paced: 33.41 %
Brady Statistic RV Percent Paced: 89.79 %
Date Time Interrogation Session: 20211223043825
HighPow Impedance: 57 Ohm
Implantable Lead Implant Date: 20171212
Implantable Lead Implant Date: 20171212
Implantable Lead Implant Date: 20171212
Implantable Lead Location: 753858
Implantable Lead Location: 753859
Implantable Lead Location: 753860
Implantable Lead Model: 4298
Implantable Lead Model: 5076
Implantable Pulse Generator Implant Date: 20171212
Lead Channel Impedance Value: 123.5 Ohm
Lead Channel Impedance Value: 123.5 Ohm
Lead Channel Impedance Value: 132.321
Lead Channel Impedance Value: 132.321
Lead Channel Impedance Value: 132.321
Lead Channel Impedance Value: 247 Ohm
Lead Channel Impedance Value: 247 Ohm
Lead Channel Impedance Value: 247 Ohm
Lead Channel Impedance Value: 247 Ohm
Lead Channel Impedance Value: 285 Ohm
Lead Channel Impedance Value: 304 Ohm
Lead Channel Impedance Value: 361 Ohm
Lead Channel Impedance Value: 361 Ohm
Lead Channel Impedance Value: 399 Ohm
Lead Channel Impedance Value: 418 Ohm
Lead Channel Impedance Value: 418 Ohm
Lead Channel Impedance Value: 418 Ohm
Lead Channel Impedance Value: 475 Ohm
Lead Channel Pacing Threshold Amplitude: 0.5 V
Lead Channel Pacing Threshold Amplitude: 0.75 V
Lead Channel Pacing Threshold Amplitude: 0.75 V
Lead Channel Pacing Threshold Pulse Width: 0.4 ms
Lead Channel Pacing Threshold Pulse Width: 0.4 ms
Lead Channel Pacing Threshold Pulse Width: 0.4 ms
Lead Channel Sensing Intrinsic Amplitude: 14.875 mV
Lead Channel Sensing Intrinsic Amplitude: 14.875 mV
Lead Channel Sensing Intrinsic Amplitude: 3.75 mV
Lead Channel Sensing Intrinsic Amplitude: 3.75 mV
Lead Channel Setting Pacing Amplitude: 1.25 V
Lead Channel Setting Pacing Amplitude: 2 V
Lead Channel Setting Pacing Amplitude: 2.5 V
Lead Channel Setting Pacing Pulse Width: 0.4 ms
Lead Channel Setting Pacing Pulse Width: 0.4 ms
Lead Channel Setting Sensing Sensitivity: 0.3 mV

## 2020-06-19 NOTE — Progress Notes (Signed)
Remote ICD transmission.   

## 2020-07-14 ENCOUNTER — Telehealth: Payer: Self-pay

## 2020-07-14 NOTE — Telephone Encounter (Signed)
Spoke with patient and BCC. Dr. Augustin Coupe talked to [facility] about patient follow up but patient has been seeing Dr. Carlis Abbott. Patient does not have a preference about which MD he sees and per Donzetta Matters patient may keep his appt with [facility] tomorrow. Patient and HD aware.

## 2020-07-15 ENCOUNTER — Ambulatory Visit (INDEPENDENT_AMBULATORY_CARE_PROVIDER_SITE_OTHER): Payer: Medicare Other | Admitting: Vascular Surgery

## 2020-07-15 ENCOUNTER — Other Ambulatory Visit: Payer: Self-pay

## 2020-07-15 ENCOUNTER — Encounter: Payer: Self-pay | Admitting: Vascular Surgery

## 2020-07-15 ENCOUNTER — Other Ambulatory Visit: Payer: Self-pay | Admitting: *Deleted

## 2020-07-15 ENCOUNTER — Encounter: Payer: Self-pay | Admitting: *Deleted

## 2020-07-15 VITALS — BP 139/72 | HR 66 | Temp 98.0°F | Resp 16 | Ht 72.0 in | Wt 256.0 lb

## 2020-07-15 DIAGNOSIS — N186 End stage renal disease: Secondary | ICD-10-CM | POA: Diagnosis not present

## 2020-07-15 NOTE — Progress Notes (Signed)
Patient name: Matthew Wood. MRN: [id number] DOB: 28-Jan-1951 Sex: male  REASON FOR VISIT: Concern for central venous stenosis versus occlusion  HPI: Matthew Wood. is a 70 y.o. male with history of end-stage renal disease now on dialysis Monday Wednesday Friday, coronary artery disease, CHF that presents as a referral from Dr. Augustin Coupe for evaluation of central venous stenosis versus occlusion following recent removal of his tunneled dialysis catheter.  Patient states that the fistula in his right arm is working well but has had profound swelling in the right arm over the last several weeks after his dialysis catheter was removed.  Swelling has not slightly improved over last week and arm about half the size now.  He is well-known to vascular surgery and had a right radiocephalic on 9/38/1017 and then later had sidebranch ligation x5 on 02/14/20.  Past Medical History:  Diagnosis Date  . Acute respiratory failure ([place]) 10/04/2017  . Anemia   . Anemia in chronic kidney disease 09/29/2019  . Arthritis of right knee 10/05/2017  . CAD (coronary artery disease), native coronary artery 11/15/2013   70-80% mid RCA with marked ectasia.   . Cardiorenal syndrome with renal failure 10/04/2017  . Carotid aneurysm, left ([place]) 11/15/2013  . CHF (congestive heart failure) ([place])   . CKD (chronic kidney disease), stage IV ([place]) 07/20/2016  . CKD (chronic kidney disease), stage V ([place]) 03/27/2019  . Coagulation defect, unspecified ([place]) 09/29/2019  . Coronary artery disease    a. LHC 04/27/10 left main patent, 30-40% LAD, mid circumflex 30%, 80% mid RCA accounting for the appearance of an infract/peri-infract ischemia on nuclear testing, LV EF of 30-45%--> medical therapy  . CVA (cerebrovascular accident) ([place]) 2011   [place]; "little balance problems since" (05/25/2016)  . Dyspnea   . Elevated troponin 10/04/2017  . End stage renal disease ([place]) 09/29/2019  . Erectile dysfunction 11/15/2013  . Essential  hypertension 11/15/2013  . Gout   . Gout, unspecified 10/04/2019  . Hematuria 10/05/2017  . History of kidney stones    "passed them" (05/25/2016)  . Hyperlipidemia   . Hypertension   . Iron deficiency anemia, unspecified 09/29/2019  . LBBB (left bundle branch block)   . Left pontine CVA ([place]) 11/15/2013  . Multinodular goiter   . Obesity   . OSA (obstructive sleep apnea) 11/15/2013  . OSA on CPAP   . PONV (postoperative nausea and vomiting)    diarrhea  . Presence of permanent cardiac pacemaker   . Renal insufficiency   . Right upper lobe pneumonia 10/04/2017  . Secondary hyperparathyroidism of renal origin ([place]) 09/29/2019  . Type 2 diabetes mellitus with nephropathy ([place]) 10/05/2017  . Type II diabetes mellitus ([place])   . Unspecified sequelae of cerebral infarction 10/04/2019  . Wears partial dentures     Past Surgical History:  Procedure Laterality Date  . A/V FISTULAGRAM Right 01/31/2020   Procedure: A/V FISTULAGRAM;  Surgeon: Marty Heck, MD;  Location: [facility];  Service: Cardiovascular;  Laterality: Right;  . AV FISTULA PLACEMENT Right 05/25/2019   Procedure: ARTERIOVENOUS (AV) FISTULA CREATION RIGHT ARM;  Surgeon: Marty Heck, MD;  Location: [facility];  Service: Vascular;  Laterality: Right;  . COLONOSCOPY WITH PROPOFOL N/A 04/16/2014   Procedure: COLONOSCOPY WITH PROPOFOL;  Surgeon: [name], MD;  [facility];  Service: Endoscopy;  Laterality: N/A;  . EP IMPLANTABLE DEVICE N/A 05/25/2016   Procedure: BiV ICD Insertion CRT-D;  Surgeon: [name], MD;  Location: [facility];  Service: Cardiovascular;  Laterality: N/A;  . INSERT / REPLACE / REMOVE PACEMAKER  05/25/2016   biventricular pacemaker  . LAPAROSCOPIC CHOLECYSTECTOMY    . REVISON OF ARTERIOVENOUS FISTULA Right 02/14/2020   Procedure: RIGHT ARM ARTERIOVENOUS FISTULA REVISON WITH SIDE BRANCH LIGATION VERSUS CONVERSION TO UPPER ARM FISTULA;  Surgeon: Marty Heck,  MD;  Location: [facility];  Service: Vascular;  Laterality: Right;    Family History  Problem Relation Age of Onset  . Hypertension Mother   . Breast cancer Mother   . Heart attack Father   . Stroke Father   . Diabetes type II Father   . Hypertension Sister     SOCIAL HISTORY: Social History   Tobacco Use  . Smoking status: Former Smoker    Packs/day: 0.50    Years: 27.00    Pack years: 13.50    Types: Cigarettes    Quit date: 03/25/2010    Years since quitting: 10.3  . Smokeless tobacco: Never Used  Substance Use Topics  . Alcohol use: Yes    Comment: rare    Allergies  Allergen Reactions  . Cinacalcet Hcl Other (See Comments)    Current Outpatient Medications  Medication Sig Dispense Refill  . allopurinol (ZYLOPRIM) 300 MG tablet Take 300 mg by mouth daily.    Marland Kitchen amLODipine-benazepril (LOTREL) 10-40 MG per capsule Take 1 capsule by mouth daily.     Marland Kitchen aspirin EC 81 MG tablet Take 81 mg by mouth at bedtime.    Marland Kitchen b complex-vitamin c-folic acid (NEPHRO-VITE) 0.8 MG TABS tablet Take 1 tablet by mouth daily.     . carvedilol (COREG) 25 MG tablet Take 25 mg by mouth 2 (two) times daily with a meal. Takes only 1 tablet on HD days    . Doxercalciferol 1 MCG CAPS Take 1 mcg by mouth every Monday, Wednesday, and Friday.     . heparin 1000 unit/mL SOLN injection 5,000 Units by Dialysis route every Monday, Wednesday, and Friday.     Marland Kitchen HUMALOG KWIKPEN 100 UNIT/ML KiwkPen Inject 15 Units into the skin daily after breakfast.   5  . insulin glargine (LANTUS) 100 UNIT/ML injection Inject 28 Units into the skin at bedtime.     . NON FORMULARY CPAP: At bedtime    . rosuvastatin (CRESTOR) 20 MG tablet Take 20 mg by mouth daily.    . sevelamer carbonate (RENVELA) 800 MG tablet Take 800-2,400 mg by mouth See admin instructions. 2400 mg with meals. And 800 mg with snacks    . oxyCODONE-acetaminophen (PERCOCET) 10-325 MG tablet Take 1 tablet by mouth every 6 (six) hours as needed for pain.  (Patient not taking: No sig reported) 12 tablet 0   Current Facility-Administered Medications  Medication Dose Route Frequency Provider Last Rate Last Admin  . 0.9 %  sodium chloride infusion  250 mL Intravenous PRN Marty Heck, MD        REVIEW OF SYSTEMS:  [X]  denotes positive finding, [ ]  denotes negative finding Cardiac  Comments:  Chest pain or chest pressure:    Shortness of breath upon exertion:    Short of breath when lying flat:    Irregular heart rhythm:        Vascular    Pain in calf, thigh, or hip brought on by ambulation:    Pain in feet at night that wakes you up from your sleep:     Blood clot in your veins:  Leg swelling:         Pulmonary    Oxygen at home:    Productive cough:     Wheezing:         Neurologic    Sudden weakness in arms or legs:     Sudden numbness in arms or legs:     Sudden onset of difficulty speaking or slurred speech:    Temporary loss of vision in one eye:     Problems with dizziness:         Gastrointestinal    Blood in stool:     Vomited blood:         Genitourinary    Burning when urinating:     Blood in urine:        Psychiatric    Major depression:         Hematologic    Bleeding problems:    Problems with blood clotting too easily:        Skin    Rashes or ulcers:        Constitutional    Fever or chills:      PHYSICAL EXAM: Vitals:   07/15/20 0822  BP: 139/72  Pulse: 66  Resp: 16  Temp: 98 F (36.7 C)  TempSrc: Temporal  SpO2: 97%  Weight: 256 lb (116.1 kg)  Height: 6' (1.829 m)    GENERAL: The patient is a well-nourished male, in no acute distress. The vital signs are documented above. CARDIAC: There is a regular rate and rhythm.  VASCULAR:  Right radiocephalic fistula with good thrill PULMONARY: There is good air exchange bilaterally without wheezing or rales. ABDOMEN: Soft and non-tender. MUSCULOSKELETAL: There are no major deformities or cyanosis. NEUROLOGIC: No focal weakness or  paresthesias are detected. SKIN: There are no ulcers or rashes noted. PSYCHIATRIC: The patient has a normal affect.  DATA:   Disc from [facility] unable to upload images  Assessment/Plan:  70 year old male presents for evaluation of central venous stenosis versus occlusion given profound right arm swelling in the setting of right radiocephalic AV fistula.  The fistula is working well per the patient but ongoing issues with right arm swelling.  Recent fistulogram by Dr. Augustin Coupe with [facility].  I was able to load the disc from [facility] but could not review any images from his recent fistulogram given the images would not load.  I recommended a central venogram with possible intervention in the Cath Lab and risks benefits discussed.   Marty Heck, MD [facility] Office: [phone]

## 2020-07-17 ENCOUNTER — Other Ambulatory Visit: Payer: Self-pay

## 2020-07-17 ENCOUNTER — Encounter: Payer: Self-pay | Admitting: Cardiology

## 2020-07-17 ENCOUNTER — Ambulatory Visit (INDEPENDENT_AMBULATORY_CARE_PROVIDER_SITE_OTHER): Payer: Medicare Other | Admitting: Cardiology

## 2020-07-17 VITALS — BP 134/76 | HR 88 | Ht 72.0 in | Wt 258.8 lb

## 2020-07-17 DIAGNOSIS — I255 Ischemic cardiomyopathy: Secondary | ICD-10-CM

## 2020-07-17 DIAGNOSIS — Z95810 Presence of automatic (implantable) cardiac defibrillator: Secondary | ICD-10-CM | POA: Diagnosis not present

## 2020-07-17 MED ORDER — ISOSORBIDE MONONITRATE ER 30 MG PO TB24: 30.0000 mg | ORAL_TABLET | Freq: Every day | ORAL | 6 refills | Status: DC

## 2020-07-17 MED ORDER — HYDRALAZINE HCL 25 MG PO TABS: 25.0000 mg | ORAL_TABLET | Freq: Two times a day (BID) | ORAL | 6 refills | Status: DC

## 2020-07-17 NOTE — Progress Notes (Signed)
Electrophysiology Office Note   Date:  07/17/2020   ID:  Matthew Wood., DOB [birth year], MRN [id number]  PCP:  Seward Carol, MD  Cardiologist:  Tamala Julian Primary Electrophysiologist:  Maelee Hoot Meredith Leeds, MD    No chief complaint on file.    History of Present Illness: Matthew Wood. is a 70 y.o. male who presents today for electrophysiology evaluation.     He has a history significant for coronary artery disease, chronic combined systolic and diastolic heart failure, hypertension, hyperlipidemia, left bundle branch block, OSA on CPAP, diabetes, CVA, and stage III-IV CKD.  Left heart catheterization in 2011 showed significant coronary artery disease and he was managed medically.  He is now status post Medtronic CRT-D implanted 06/03/2016.  Today, denies symptoms of palpitations, chest pain,  orthopnea, PND, lower extremity edema, claudication, dizziness, presyncope, syncope, bleeding, or neurologic sequela. The patient is tolerating medications without difficulties.  His main complaint today is dyspnea on exertion.  He has been feeling this over the last year.  He has recently been put on dialysis.  He is not volume overloaded at this time.   Past Medical History:  Diagnosis Date  . Acute respiratory failure ([place]) 10/04/2017  . Anemia   . Anemia in chronic kidney disease 09/29/2019  . Arthritis of right knee 10/05/2017  . CAD (coronary artery disease), native coronary artery 11/15/2013   70-80% mid RCA with marked ectasia.   . Cardiorenal syndrome with renal failure 10/04/2017  . Carotid aneurysm, left ([place]) 11/15/2013  . CHF (congestive heart failure) ([place])   . CKD (chronic kidney disease), stage IV ([place]) 07/20/2016  . CKD (chronic kidney disease), stage V ([place]) 03/27/2019  . Coagulation defect, unspecified ([place]) 09/29/2019  . Coronary artery disease    a. LHC 04/27/10 left main patent, 30-40% LAD, mid circumflex 30%, 80% mid RCA accounting for the appearance of an  infract/peri-infract ischemia on nuclear testing, LV EF of 30-45%--> medical therapy  . CVA (cerebrovascular accident) ([place]) 2011   potine; "little balance problems since" (05/25/2016)  . Dyspnea   . Elevated troponin 10/04/2017  . End stage renal disease ([place]) 09/29/2019  . Erectile dysfunction 11/15/2013  . Essential hypertension 11/15/2013  . Gout   . Gout, unspecified 10/04/2019  . Hematuria 10/05/2017  . History of kidney stones    "passed them" (05/25/2016)  . Hyperlipidemia   . Hypertension   . Iron deficiency anemia, unspecified 09/29/2019  . LBBB (left bundle branch block)   . Left pontine CVA ([place]) 11/15/2013  . Multinodular goiter   . Obesity   . OSA (obstructive sleep apnea) 11/15/2013  . OSA on CPAP   . PONV (postoperative nausea and vomiting)    diarrhea  . Presence of permanent cardiac pacemaker   . Renal insufficiency   . Right upper lobe pneumonia 10/04/2017  . Secondary hyperparathyroidism of renal origin ([place]) 09/29/2019  . Type 2 diabetes mellitus with nephropathy ([place]) 10/05/2017  . Type II diabetes mellitus ([place])   . Unspecified sequelae of cerebral infarction 10/04/2019  . Wears partial dentures    Past Surgical History:  Procedure Laterality Date  . A/V FISTULAGRAM Right 01/31/2020   Procedure: A/V FISTULAGRAM;  Surgeon: Marty Heck, MD;  Location: [facility];  Service: Cardiovascular;  Laterality: Right;  . AV FISTULA PLACEMENT Right 05/25/2019   Procedure: ARTERIOVENOUS (AV) FISTULA CREATION RIGHT ARM;  Surgeon: Marty Heck, MD;  Location: [facility];  Service: Vascular;  Laterality: Right;  .  COLONOSCOPY WITH PROPOFOL N/A 04/16/2014   Procedure: COLONOSCOPY WITH PROPOFOL;  Surgeon: [name], MD;  Location: [facility];  Service: Endoscopy;  Laterality: N/A;  . EP IMPLANTABLE DEVICE N/A 05/25/2016   Procedure: BiV ICD Insertion CRT-D;  Surgeon: Riese Hellard Meredith Leeds, MD;  Location: [facility];  Service: Cardiovascular;  Laterality:  N/A;  . INSERT / REPLACE / REMOVE PACEMAKER  05/25/2016   biventricular pacemaker  . LAPAROSCOPIC CHOLECYSTECTOMY    . REVISON OF ARTERIOVENOUS FISTULA Right 02/14/2020   Procedure: RIGHT ARM ARTERIOVENOUS FISTULA REVISON WITH SIDE BRANCH LIGATION VERSUS CONVERSION TO UPPER ARM FISTULA;  Surgeon: Marty Heck, MD;  Location: [facility];  Service: Vascular;  Laterality: Right;     Current Outpatient Medications  Medication Sig Dispense Refill  . allopurinol (ZYLOPRIM) 300 MG tablet Take 300 mg by mouth daily.    Marland Kitchen aspirin EC 81 MG tablet Take 81 mg by mouth at bedtime.    Marland Kitchen b complex-vitamin c-folic acid (NEPHRO-VITE) 0.8 MG TABS tablet Take 1 tablet by mouth daily.     . carvedilol (COREG) 25 MG tablet Take 25 mg by mouth See admin instructions. TAKE 1 TABLET (25 MG) TUESDAYS, THURSDAYS, SATURDAYS & SUNDAYS BY MOUTH TWICE DAILY TAKE 1 TABLET (25 MG) MONDAYS, WEDNESDAYS, & FRIDAYS BY MOUTH IN THE EVENING ONLY    . Doxercalciferol 1 MCG CAPS Take 1 mcg by mouth every Monday, Wednesday, and Friday.     . heparin 1000 unit/mL SOLN injection 5,000 Units by Dialysis route every Monday, Wednesday, and Friday.     Marland Kitchen HUMALOG KWIKPEN 100 UNIT/ML KiwkPen Inject 15 Units into the skin daily after breakfast.   5  . insulin glargine (LANTUS) 100 UNIT/ML injection Inject 28 Units into the skin at bedtime.     . lidocaine-prilocaine (EMLA) cream Apply 1 application topically as needed (prior to port being accessed).    . NON FORMULARY CPAP: At bedtime    . oxyCODONE-acetaminophen (PERCOCET) 10-325 MG tablet Take 1 tablet by mouth every 6 (six) hours as needed for pain. 12 tablet 0  . rosuvastatin (CRESTOR) 20 MG tablet Take 20 mg by mouth at bedtime.    . sevelamer carbonate (RENVELA) 800 MG tablet Take 800-2,400 mg by mouth See admin instructions. TAKE 2 TABLETS (1600 MG) BY MOUTH WITH EACH MEAL & TAKE 1 TABLET (800 MG) BY MOUTH WITH A SNACK.     Current Facility-Administered Medications  Medication  Dose Route Frequency Provider Last Rate Last Admin  . 0.9 %  sodium chloride infusion  250 mL Intravenous PRN Marty Heck, MD        Allergies:   Cinacalcet hcl   Social History:  The patient  reports that he quit smoking about 10 years ago. His smoking use included cigarettes. He has a 13.50 pack-year smoking history. He has never used smokeless tobacco. He reports current alcohol use. He reports that he does not use drugs.   Family History:  The patient's family history includes Breast cancer in his mother; Diabetes type II in his father; Heart attack in his father; Hypertension in his mother and sister; Stroke in his father.   ROS:  Please see the history of present illness.   Otherwise, review of systems is positive for none.   All other systems are reviewed and negative.   PHYSICAL EXAM: VS:  BP 134/76   Pulse 88   Ht 6' (1.829 m)   Wt 258 lb 12.8 oz (117.4 kg)  SpO2 99%   BMI 35.10 kg/m  , BMI Body mass index is 35.1 kg/m. GEN: Well nourished, well developed, in no acute distress  HEENT: normal  Neck: no JVD, carotid bruits, or masses Cardiac: RRR; no murmurs, rubs, or gallops,no edema  Respiratory:  clear to auscultation bilaterally, normal work of breathing GI: soft, nontender, nondistended, + BS MS: no deformity or atrophy  Skin: warm and dry, device site well healed Neuro:  Strength and sensation are intact Psych: euthymic mood, full affect  EKG:  EKG is ordered today. Personal review of the ekg ordered shows sinus rhythm, ventricular paced, PVCs  Personal review of the device interrogation today. Results in [facility]: 09/18/2019: ALT 21; Platelets 135 02/14/2020: BUN 34; Creatinine, Ser 6.70; Hemoglobin 11.9; Potassium 3.3; Sodium 139    Lipid Panel     Component Value Date/Time   CHOL  02/08/2010 0600    171        ATP III CLASSIFICATION:  <200     mg/dL   Desirable  200-239  mg/dL   Borderline High  >=240    mg/dL   High          TRIG  248 (H) 02/08/2010 0600   HDL 34 (L) 02/08/2010 0600   CHOLHDL 5.0 02/08/2010 0600   VLDL 50 (H) 02/08/2010 0600   LDLCALC  02/08/2010 0600    87        Total Cholesterol/HDL:CHD Risk Coronary Heart Disease Risk Table                     Men   Women  1/2 Average Risk   3.4   3.3  Average Risk       5.0   4.4  2 X Average Risk   9.6   7.1  3 X Average Risk  23.4   11.0        Use the calculated Patient Ratio above and the CHD Risk Table to determine the patient's CHD Risk.        ATP III CLASSIFICATION (LDL):  <100     mg/dL   Optimal  100-129  mg/dL   Near or Above                    Optimal  130-159  mg/dL   Borderline  160-189  mg/dL   High  >190     mg/dL   Very High     Wt Readings from Last 3 Encounters:  07/17/20 258 lb 12.8 oz (117.4 kg)  07/15/20 256 lb (116.1 kg)  03/18/20 260 lb (117.9 kg)      Other studies Reviewed: Additional studies/ records that were reviewed today include: TTE 02/19/16  Review of the above records today demonstrates:  - Left ventricle: The cavity size was moderately dilated. Wall   thickness was increased in a pattern of mild LVH. Systolic   function was moderately to severely reduced. The estimated   ejection fraction was in the range of 30% to 35%. Diffuse   hypokinesis. Features are consistent with a pseudonormal left   ventricular filling pattern, with concomitant abnormal relaxation   and increased filling pressure (grade 2 diastolic dysfunction). - Aortic valve: There was no stenosis. There was mild   regurgitation. - Aorta: Mildly dilated aortic root and ascending aorta. Aortic   root dimension: 39 mm (ED). Ascending aortic diameter: 42 mm (S). - Mitral valve: There was mild regurgitation. - Left  atrium: The atrium was moderately dilated. - Right ventricle: The cavity size was normal. Systolic function   was normal. - Right atrium: The atrium was mildly dilated. - Pulmonary arteries: No complete TR doppler jet so unable to    estimate PA systolic pressure. - Systemic veins: IVC not fully visualized. - Pericardium, extracardiac: A trivial pericardial effusion was   identified posterior to the heart.   ASSESSMENT AND PLAN:  1.  Chronic combined systolic and diastolic heart failure ejection fraction 30 to 35%.  Status post Medtronic CRT-P implanted 06/03/2016.Marland Kitchen He is on carvedilol.  We Daylan Juhnke add Imdur and hydralazine for heart failure optimization.  2.  Coronary artery disease: No current chest pain.  Left heart catheterization with significant disease.  Treated medically.    3.  Hypertension: Currently well controlled  4.  Hyperlipidemia: Continue statin  5.  Obstructive sleep apnea: CPAP compliance encouraged.  He is not consistently wearing his CPAP mask.  He says that it does not fit well.  Brynlei Klausner discuss with sleep medicine.  Current medicines are reviewed at length with the patient today.   The patient does not have concerns regarding his medicines.  The following changes were made today: None  Labs/ tests ordered today include:  Orders Placed This Encounter  Procedures  . EKG 12-Lead     Disposition:   FU with Samanth Mirkin 12 months  Signed, Mckinzey Entwistle Meredith Leeds, MD  07/17/2020 3:40 PM     [address] [place] [address] [phone] (office) [phone] (fax)

## 2020-07-17 NOTE — Patient Instructions (Addendum)
Medication Instructions:  Your physician has recommended you make the following change in your medication:  1. START Hydralazine 25 mg once daily 2. START Imdur 30 mg once daily  *If you need a refill on your cardiac medications before your next appointment, please call your pharmacy*   Lab Work: None ordered   Testing/Procedures: None ordered   Follow-Up: At [facility], you and your health needs are our priority.  As part of our continuing mission to provide you with exceptional heart care, we have created designated Provider Care Teams.  These Care Teams include your primary Cardiologist (physician) and Advanced Practice Providers (APPs -  Physician Assistants and Nurse Practitioners) who all work together to provide you with the care you need, when you need it.  We recommend signing up for the patient portal called "MyChart".  Sign up information is provided on this After Visit Summary.  MyChart is used to connect with patients for Virtual Visits (Telemedicine).  Patients are able to view lab/test results, encounter notes, upcoming appointments, etc.  Non-urgent messages can be sent to your provider as well.   To learn more about what you can do with MyChart, go to [email or web address].    Remote monitoring is used to monitor your Pacemaker or ICD from home. This monitoring reduces the number of office visits required to check your device to one time per year. It allows [place] to keep an eye on the functioning of your device to ensure it is working properly. You are scheduled for a device check from home on 09/04/20. You may send your transmission at any time that day. If you have a wireless device, the transmission will be sent automatically. After your physician reviews your transmission, you will receive a postcard with your next transmission date.  Your next appointment:   1 year(s[facility] in [facility]  The format for your next appointment:   In Person  Provider:   Allegra Lai,  MD   Thank you for choosing [facility]!!   Trinidad Curet, RN [phone]    Other Instructions  Hydralazine Injection What is this medicine? HYDRALAZINE (hye DRAL a zeen) is a vasodilator. It treats high blood pressure. This medicine may be used for other purposes; ask your health care provider or pharmacist if you have questions. What should I tell my health care provider before I take this medicine? They need to know if you have any of these conditions:  blood vessel disease  heart disease including angina or history of heart attack  kidney disease  liver disease  systemic lupus erythematosus (SLE)  an unusual or allergic reaction to hydralazine, tartrazine dye, other medicines, foods, dyes, or preservatives  pregnant or trying to get pregnant  breast-feeding How should I use this medicine? This drug is injected into a vein or a muscle. It is given by a health care provider in a hospital or clinic setting. Talk to your health care provider about the use of this drug in children. Special care may be needed. Overdosage: If you think you have taken too much of this medicine contact a poison control center or emergency room at once. NOTE: This medicine is only for you. Do not share this medicine with others. What if I miss a dose? This does not apply. This drug is not for regular use. What may interact with this medicine?  medicines for high blood pressure  medicines for depression This list may not describe all possible interactions. Give your health care provider a  list of all the medicines, herbs, non-prescription drugs, or dietary supplements you use. Also tell them if you smoke, drink alcohol, or use illegal drugs. Some items may interact with your medicine. What should I watch for while using this medicine? You may get drowsy or dizzy. Do not drive, use machinery, or do anything that needs mental alertness until you know how this medicine affects you. Do not  stand or sit up quickly, especially if you are an older patient. This reduces the risk of dizzy or fainting spells. Alcohol may interfere with the effect of this medicine. Avoid alcoholic drinks. Do not treat yourself for coughs, colds, or pain while you are taking this medicine without asking your doctor or health care professional for advice. Some ingredients may increase your blood pressure. What side effects may I notice from receiving this medicine? Side effects that you should report to your doctor or health care professional as soon as possible:  allergic reactions like skin rash, itching or hives, swelling of the face, lips, or tongue  breathing problems  chest pain  fast, irregular heartbeat  fever, chills, or sore throat  pain, tingling, numbness in the hands or feet  stiff or swollen joints  sudden weight gain  swelling of the feet or legs  swollen lymph glands  unusually weak or tired Side effects that usually do not require medical attention (report to your doctor or health care professional if they continue or are bothersome):  diarrhea or constipation  headache  loss of appetite  nausea, vomiting This list may not describe all possible side effects. Call your doctor for medical advice about side effects. You may report side effects to FDA at 1-800-FDA-1088. Where should I keep my medicine? This drug is given in a hospital or clinic. It will not be stored at home. NOTE: This sheet is a summary. It may not cover all possible information. If you have questions about this medicine, talk to your doctor, pharmacist, or health care provider.  2021 Elsevier/Gold Standard (2019-01-30 18:15:31)   Isosorbide Mononitrate extended-release tablets What is this medicine? ISOSORBIDE MONONITRATE (eye soe SOR bide mon oh NYE trate) is a vasodilator. It relaxes blood vessels, increasing the blood and oxygen supply to your heart. This medicine is used to prevent chest pain  caused by angina. It will not help to stop an episode of chest pain. This medicine may be used for other purposes; ask your health care provider or pharmacist if you have questions. COMMON BRAND NAME(S): Imdur, Isotrate ER What should I tell my health care provider before I take this medicine? They need to know if you have any of these conditions:  previous heart attack or heart failure  an unusual or allergic reaction to isosorbide mononitrate, nitrates, other medicines, foods, dyes, or preservatives  pregnant or trying to get pregnant  breast-feeding How should I use this medicine? Take this medicine by mouth with a glass of water. Follow the directions on the prescription label. Do not crush or chew. Take your medicine at regular intervals. Do not take your medicine more often than directed. Do not stop taking this medicine except on the advice of your doctor or health care professional. Talk to your pediatrician regarding the use of this medicine in children. Special care may be needed. Overdosage: If you think you have taken too much of this medicine contact a poison control center or emergency room at once. NOTE: This medicine is only for you. Do not share this medicine with  others. What if I miss a dose? If you miss a dose, take it as soon as you can. If it is almost time for your next dose, take only that dose. Do not take double or extra doses. What may interact with this medicine? Do not take this medicine with any of the following medications:  medicines used to treat erectile dysfunction (ED) like avanafil, sildenafil, tadalafil, and vardenafil  riociguat This medicine may also interact with the following medications:  medicines for high blood pressure  other medicines for angina or heart failure This list may not describe all possible interactions. Give your health care provider a list of all the medicines, herbs, non-prescription drugs, or dietary supplements you use.  Also tell them if you smoke, drink alcohol, or use illegal drugs. Some items may interact with your medicine. What should I watch for while using this medicine? Check your heart rate and blood pressure regularly while you are taking this medicine. Ask your doctor or health care professional what your heart rate and blood pressure should be and when you should contact him or her. Tell your doctor or health care professional if you feel your medicine is no longer working. You may get dizzy. Do not drive, use machinery, or do anything that needs mental alertness until you know how this medicine affects you. To reduce the risk of dizzy or fainting spells, do not sit or stand up quickly, especially if you are an older patient. Alcohol can make you more dizzy, and increase flushing and rapid heartbeats. Avoid alcoholic drinks. Do not treat yourself for coughs, colds, or pain while you are taking this medicine without asking your doctor or health care professional for advice. Some ingredients may increase your blood pressure. What side effects may I notice from receiving this medicine? Side effects that you should report to your doctor or health care professional as soon as possible:  bluish discoloration of lips, fingernails, or palms of hands  irregular heartbeat, palpitations  low blood pressure  nausea, vomiting  persistent headache  unusually weak or tired Side effects that usually do not require medical attention (report to your doctor or health care professional if they continue or are bothersome):  flushing of the face or neck  rash This list may not describe all possible side effects. Call your doctor for medical advice about side effects. You may report side effects to FDA at 1-800-FDA-1088. Where should I keep my medicine? Keep out of the reach of children. Store between 15 and 30 degrees C (59 and 86 degrees F). Keep container tightly closed. Throw away any unused medicine after the  expiration date. NOTE: This sheet is a summary. It may not cover all possible information. If you have questions about this medicine, talk to your doctor, pharmacist, or health care provider.  2021 Elsevier/Gold Standard (2013-03-30 14:48:19)

## 2020-07-22 ENCOUNTER — Other Ambulatory Visit (HOSPITAL_COMMUNITY)
Admission: RE | Admit: 2020-07-22 | Discharge: 2020-07-22 | Disposition: A | Discharge status: Home / Self Care | Payer: Medicare Other | Source: Ambulatory Visit | Attending: Vascular Surgery | Admitting: Vascular Surgery

## 2020-07-22 DIAGNOSIS — Z01812 Encounter for preprocedural laboratory examination: Secondary | ICD-10-CM | POA: Insufficient documentation

## 2020-07-22 DIAGNOSIS — Z20822 Contact with and (suspected) exposure to covid-19: Secondary | ICD-10-CM | POA: Diagnosis not present

## 2020-07-22 LAB — SARS CORONAVIRUS 2 (TAT 6-24 HRS): SARS Coronavirus 2: NEGATIVE

## 2020-07-24 ENCOUNTER — Ambulatory Visit (HOSPITAL_COMMUNITY)
Admission: RE | Admit: 2020-07-24 | Discharge: 2020-07-24 | Disposition: A | Discharge status: Home / Self Care | Payer: Medicare Other | Attending: Vascular Surgery | Admitting: Vascular Surgery

## 2020-07-24 ENCOUNTER — Ambulatory Visit (HOSPITAL_COMMUNITY)
Admission: RE | Disposition: A | Discharge status: Home / Self Care | Payer: Self-pay | Source: Home / Self Care | Attending: Vascular Surgery

## 2020-07-24 ENCOUNTER — Other Ambulatory Visit: Payer: Self-pay

## 2020-07-24 DIAGNOSIS — T82858A Stenosis of vascular prosthetic devices, implants and grafts, initial encounter: Secondary | ICD-10-CM | POA: Diagnosis not present

## 2020-07-24 DIAGNOSIS — Z8249 Family history of ischemic heart disease and other diseases of the circulatory system: Secondary | ICD-10-CM | POA: Insufficient documentation

## 2020-07-24 DIAGNOSIS — Z794 Long term (current) use of insulin: Secondary | ICD-10-CM | POA: Diagnosis not present

## 2020-07-24 DIAGNOSIS — Z87891 Personal history of nicotine dependence: Secondary | ICD-10-CM | POA: Diagnosis not present

## 2020-07-24 DIAGNOSIS — E1122 Type 2 diabetes mellitus with diabetic chronic kidney disease: Secondary | ICD-10-CM | POA: Insufficient documentation

## 2020-07-24 DIAGNOSIS — Y841 Kidney dialysis as the cause of abnormal reaction of the patient, or of later complication, without mention of misadventure at the time of the procedure: Secondary | ICD-10-CM | POA: Insufficient documentation

## 2020-07-24 DIAGNOSIS — N186 End stage renal disease: Secondary | ICD-10-CM | POA: Insufficient documentation

## 2020-07-24 DIAGNOSIS — Z992 Dependence on renal dialysis: Secondary | ICD-10-CM | POA: Insufficient documentation

## 2020-07-24 DIAGNOSIS — I132 Hypertensive heart and chronic kidney disease with heart failure and with stage 5 chronic kidney disease, or end stage renal disease: Secondary | ICD-10-CM | POA: Diagnosis not present

## 2020-07-24 DIAGNOSIS — Z833 Family history of diabetes mellitus: Secondary | ICD-10-CM | POA: Insufficient documentation

## 2020-07-24 DIAGNOSIS — I509 Heart failure, unspecified: Secondary | ICD-10-CM | POA: Insufficient documentation

## 2020-07-24 DIAGNOSIS — Z7982 Long term (current) use of aspirin: Secondary | ICD-10-CM | POA: Insufficient documentation

## 2020-07-24 DIAGNOSIS — T82898A Other specified complication of vascular prosthetic devices, implants and grafts, initial encounter: Secondary | ICD-10-CM | POA: Diagnosis not present

## 2020-07-24 DIAGNOSIS — Z79899 Other long term (current) drug therapy: Secondary | ICD-10-CM | POA: Diagnosis not present

## 2020-07-24 HISTORY — PX: UPPER EXTREMITY VENOGRAPHY: CATH118272

## 2020-07-24 HISTORY — PX: PERIPHERAL VASCULAR INTERVENTION: CATH118257

## 2020-07-24 LAB — POCT I-STAT, CHEM 8
BUN: 38 mg/dL — ABNORMAL HIGH (ref 8–23)
Calcium, Ion: 1.15 mmol/L (ref 1.15–1.40)
Chloride: 97 mmol/L — ABNORMAL LOW (ref 98–111)
Creatinine, Ser: 6.7 mg/dL — ABNORMAL HIGH (ref 0.61–1.24)
Glucose, Bld: 99 mg/dL (ref 70–99)
HCT: 36 % — ABNORMAL LOW (ref 39.0–52.0)
Hemoglobin: 12.2 g/dL — ABNORMAL LOW (ref 13.0–17.0)
Potassium: 3.5 mmol/L (ref 3.5–5.1)
Sodium: 138 mmol/L (ref 135–145)
TCO2: 30 mmol/L (ref 22–32)

## 2020-07-24 LAB — GLUCOSE, CAPILLARY: Glucose-Capillary: 95 mg/dL (ref 70–99)

## 2020-07-24 SURGERY — UPPER EXTREMITY VENOGRAPHY
Anesthesia: LOCAL

## 2020-07-24 MED ORDER — LIDOCAINE HCL (PF) 1 % IJ SOLN
INTRAMUSCULAR | Status: AC
  Filled 2020-07-24: qty 30

## 2020-07-24 MED ORDER — HEPARIN SODIUM (PORCINE) 1000 UNIT/ML IJ SOLN
INTRAMUSCULAR | Status: AC
  Filled 2020-07-24: qty 1

## 2020-07-24 MED ORDER — FENTANYL CITRATE (PF) 100 MCG/2ML IJ SOLN
INTRAMUSCULAR | Status: DC | PRN
  Administered 2020-07-24 (×2): 25 ug via INTRAVENOUS
  Administered 2020-07-24: 50 ug via INTRAVENOUS

## 2020-07-24 MED ORDER — IODIXANOL 320 MG/ML IV SOLN
INTRAVENOUS | Status: DC | PRN
  Administered 2020-07-24: 150 mL via INTRAVENOUS

## 2020-07-24 MED ORDER — HYDRALAZINE HCL 20 MG/ML IJ SOLN
5.0000 mg | INTRAMUSCULAR | Status: DC | PRN
Start: 2020-07-24 — End: 2020-07-24

## 2020-07-24 MED ORDER — ACETAMINOPHEN 325 MG PO TABS: 650.0000 mg | ORAL_TABLET | ORAL | Status: DC | PRN

## 2020-07-24 MED ORDER — SODIUM CHLORIDE 0.9% FLUSH: 3.0000 mL | Freq: Two times a day (BID) | INTRAVENOUS | Status: DC

## 2020-07-24 MED ORDER — SODIUM CHLORIDE 0.9 % IV SOLN: 250.0000 mL | INTRAVENOUS | Status: DC | PRN

## 2020-07-24 MED ORDER — FENTANYL CITRATE (PF) 100 MCG/2ML IJ SOLN
INTRAMUSCULAR | Status: AC
  Filled 2020-07-24: qty 2

## 2020-07-24 MED ORDER — MIDAZOLAM HCL 2 MG/2ML IJ SOLN
INTRAMUSCULAR | Status: DC | PRN
  Administered 2020-07-24 (×2): 1 mg via INTRAVENOUS

## 2020-07-24 MED ORDER — SODIUM CHLORIDE 0.9% FLUSH: 3.0000 mL | INTRAVENOUS | Status: DC | PRN

## 2020-07-24 MED ORDER — LABETALOL HCL 5 MG/ML IV SOLN
10.0000 mg | INTRAVENOUS | Status: DC | PRN
Start: 2020-07-24 — End: 2020-07-24

## 2020-07-24 MED ORDER — HEPARIN SODIUM (PORCINE) 1000 UNIT/ML IJ SOLN
INTRAMUSCULAR | Status: DC | PRN
  Administered 2020-07-24 (×2): 5000 [IU] via INTRAVENOUS

## 2020-07-24 MED ORDER — ONDANSETRON HCL 4 MG/2ML IJ SOLN: 4.0000 mg | Freq: Four times a day (QID) | INTRAMUSCULAR | Status: DC | PRN

## 2020-07-24 MED ORDER — HEPARIN (PORCINE) IN NACL 1000-0.9 UT/500ML-% IV SOLN
INTRAVENOUS | Status: DC | PRN
  Administered 2020-07-24: 500 mL

## 2020-07-24 MED ORDER — HEPARIN (PORCINE) IN NACL 1000-0.9 UT/500ML-% IV SOLN
INTRAVENOUS | Status: AC
  Filled 2020-07-24: qty 500

## 2020-07-24 MED ORDER — MIDAZOLAM HCL 2 MG/2ML IJ SOLN
INTRAMUSCULAR | Status: AC
  Filled 2020-07-24: qty 2

## 2020-07-24 SURGICAL SUPPLY — 31 items
BALLN ATLAS 16X40X75 (BALLOONS) ×4
BALLN MUSTANG 10X80X135 (BALLOONS) ×2
BALLN MUSTANG 12X20X75 (BALLOONS) ×2
BALLN MUSTANG 8X60X135 (BALLOONS) ×2
BALLN STERLING OTW 8X60X135 (BALLOONS) ×2
BALLOON ATLAS 16X40X75 (BALLOONS) ×2 IMPLANT
BALLOON MUSTANG 10X80X135 (BALLOONS) ×1 IMPLANT
BALLOON MUSTANG 12X20X75 (BALLOONS) ×1 IMPLANT
BALLOON MUSTANG 8X60X135 (BALLOONS) ×1 IMPLANT
BALLOON STERLING OTW 8X60X135 (BALLOONS) ×1 IMPLANT
CATH ANGIO 5F BER2 100CM (CATHETERS) ×2 IMPLANT
CATH ANGIO 5F BER2 65CM (CATHETERS) ×2 IMPLANT
CATH MUSTANG 12X20X135 (BALLOONS) ×2 IMPLANT
CATH NAVICROSS ANGLED 90CM (MICROCATHETER) ×2 IMPLANT
CATH SOFT-VU ST 4F 90CM (CATHETERS) ×2 IMPLANT
KIT ENCORE 26 ADVANTAGE (KITS) ×2 IMPLANT
KIT MICROPUNCTURE NIT STIFF (SHEATH) ×2 IMPLANT
KIT PV (KITS) ×2 IMPLANT
SHEATH PINNACLE 7F 10CM (SHEATH) ×2 IMPLANT
SHEATH PINNACLE 8F 10CM (SHEATH) ×2 IMPLANT
SHEATH PINNACLE R/O II 6F 4CM (SHEATH) ×2 IMPLANT
SHEATH PINNACLE R/O II 7F 4CM (SHEATH) ×2 IMPLANT
SHEATH PINNACLE ST 7F 65CM (SHEATH) ×2 IMPLANT
SHEATH PROBE COVER 6X72 (BAG) ×2 IMPLANT
STENT EPIC VASCULAR 12X40X75 (Permanent Stent) ×2 IMPLANT
STENT EXPRESS LD 10X37X75 (Permanent Stent) ×2 IMPLANT
TRANSDUCER W/STOPCOCK (MISCELLANEOUS) ×2 IMPLANT
TRAY PV CATH (CUSTOM PROCEDURE TRAY) ×2 IMPLANT
WIRE G V18X300CM (WIRE) ×2 IMPLANT
WIRE ROSEN-J .035X260CM (WIRE) ×4 IMPLANT
WIRE STARTER BENTSON 035X150 (WIRE) ×2 IMPLANT

## 2020-07-24 NOTE — H&P (Signed)
History and Physical Interval Note:  07/24/2020 11:07 AM  Matthew Wood.  has presented today for surgery, with the diagnosis of PVD.  The various methods of treatment have been discussed with the patient and family. After consideration of risks, benefits and other options for treatment, the patient has consented to  Procedure(s): CENTRAL VENOGRAPHY (N/A) as a surgical intervention.  The patient's history has been reviewed, patient examined, no change in status, stable for surgery.  I have reviewed the patient's chart and labs.  Questions were answered to the patient's satisfaction.    Right arm fistulogram and concern for central venous stenosis vs occlusion  Marty Heck  Patient name: Matthew Wood.     MRN: [id number]        DOB: 03-Feb-1951          Sex: male  REASON FOR VISIT: Concern for central venous stenosis versus occlusion  HPI: Matthew Wood. is a 70 y.o. male with history of end-stage renal disease now on dialysis Monday Wednesday Friday, coronary artery disease, CHF that presents as a referral from Dr. Augustin Coupe for evaluation of central venous stenosis versus occlusion following recent removal of his tunneled dialysis catheter.  Patient states that the fistula in his right arm is working well but has had profound swelling in the right arm over the last several weeks after his dialysis catheter was removed.  Swelling has not slightly improved over last week and arm about half the size now.  He is well-known to vascular surgery and had a right radiocephalic on 0/16/0109 and then later had sidebranch ligation x5 on 02/14/20.      Past Medical History:  Diagnosis Date  . Acute respiratory failure ([place]) 10/04/2017  . Anemia   . Anemia in chronic kidney disease 09/29/2019  . Arthritis of right knee 10/05/2017  . CAD (coronary artery disease), native coronary artery 11/15/2013   70-80% mid RCA with marked ectasia.   . Cardiorenal syndrome with renal failure 10/04/2017   . Carotid aneurysm, left ([place]) 11/15/2013  . CHF (congestive heart failure) ([place])   . CKD (chronic kidney disease), stage IV ([place]) 07/20/2016  . CKD (chronic kidney disease), stage V ([place]) 03/27/2019  . Coagulation defect, unspecified ([place]) 09/29/2019  . Coronary artery disease    a. LHC 04/27/10 left main patent, 30-40% LAD, mid circumflex 30%, 80% mid RCA accounting for the appearance of an infract/peri-infract ischemia on nuclear testing, LV EF of 30-45%--> medical therapy  . CVA (cerebrovascular accident) ([place]) 2011   potine; "little balance problems since" (05/25/2016)  . Dyspnea   . Elevated troponin 10/04/2017  . End stage renal disease ([place]) 09/29/2019  . Erectile dysfunction 11/15/2013  . Essential hypertension 11/15/2013  . Gout   . Gout, unspecified 10/04/2019  . Hematuria 10/05/2017  . History of kidney stones    "passed them" (05/25/2016)  . Hyperlipidemia   . Hypertension   . Iron deficiency anemia, unspecified 09/29/2019  . LBBB (left bundle branch block)   . 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 Multinodular goiter   . Obesity   . OSA (obstructive sleep apnea) 11/15/2013  . OSA on CPAP   . PONV (postoperative nausea and vomiting)    diarrhea  . Presence of permanent cardiac pacemaker   . Renal insufficiency   . Right upper lobe pneumonia 10/04/2017  . Secondary hyperparathyroidism of renal origin ([place]) 09/29/2019  . Type 2 diabetes mellitus with nephropathy ([place]) 10/05/2017  . Type II  diabetes mellitus ([place])   . Unspecified sequelae of cerebral infarction 10/04/2019  . Wears partial dentures          Past Surgical History:  Procedure Laterality Date  . A/V FISTULAGRAM Right 01/31/2020   Procedure: A/V FISTULAGRAM;  Surgeon: Marty Heck, MD;  Location: [facility];  Service: Cardiovascular;  Laterality: Right;  . AV FISTULA PLACEMENT Right 05/25/2019   Procedure: ARTERIOVENOUS (AV) FISTULA CREATION RIGHT ARM;  Surgeon: Marty Heck, MD;  Location: [facility];  Service: Vascular;  Laterality: Right;  . COLONOSCOPY WITH PROPOFOL N/A 04/16/2014   Procedure: COLONOSCOPY WITH PROPOFOL;  Surgeon: [name], MD;  Location: [facility];  Service: Endoscopy;  Laterality: N/A;  . EP IMPLANTABLE DEVICE N/A 05/25/2016   Procedure: BiV ICD Insertion CRT-D;  Surgeon: [name], MD;  Location: [facility];  Service: Cardiovascular;  Laterality: N/A;  . INSERT / REPLACE / REMOVE PACEMAKER  05/25/2016   biventricular pacemaker  . LAPAROSCOPIC CHOLECYSTECTOMY    . REVISON OF ARTERIOVENOUS FISTULA Right 02/14/2020   Procedure: RIGHT ARM ARTERIOVENOUS FISTULA REVISON WITH SIDE BRANCH LIGATION VERSUS CONVERSION TO UPPER ARM FISTULA;  Surgeon: Marty Heck, MD;  Location: [facility];  Service: Vascular;  Laterality: Right;         Family History  Problem Relation Age of Onset  . Hypertension Mother   . Breast cancer Mother   . Heart attack Father   . Stroke Father   . Diabetes type II Father   . Hypertension Sister     SOCIAL HISTORY: Social History        Tobacco Use  . Smoking status: Former Smoker    Packs/day: 0.50    Years: 27.00    Pack years: 13.50    Types: Cigarettes    Quit date: 03/25/2010    Years since quitting: 10.3  . Smokeless tobacco: Never Used  Substance Use Topics  . Alcohol use: Yes    Comment: rare        Allergies  Allergen Reactions  . Cinacalcet Hcl Other (See Comments)          Current Outpatient Medications  Medication Sig Dispense Refill  . allopurinol (ZYLOPRIM) 300 MG tablet Take 300 mg by mouth daily.    Marland Kitchen amLODipine-benazepril (LOTREL) 10-40 MG per capsule Take 1 capsule by mouth daily.     Marland Kitchen aspirin EC 81 MG tablet Take 81 mg by mouth at bedtime.    Marland Kitchen b complex-vitamin c-folic acid (NEPHRO-VITE) 0.8 MG TABS tablet Take 1 tablet by mouth daily.     . carvedilol (COREG) 25 MG tablet Take 25 mg by mouth 2 (two) times  daily with a meal. Takes only 1 tablet on HD days    . Doxercalciferol 1 MCG CAPS Take 1 mcg by mouth every Monday, Wednesday, and Friday.     . heparin 1000 unit/mL SOLN injection 5,000 Units by Dialysis route every Monday, Wednesday, and Friday.     Marland Kitchen HUMALOG KWIKPEN 100 UNIT/ML KiwkPen Inject 15 Units into the skin daily after breakfast.   5  . insulin glargine (LANTUS) 100 UNIT/ML injection Inject 28 Units into the skin at bedtime.     . NON FORMULARY CPAP: At bedtime    . rosuvastatin (CRESTOR) 20 MG tablet Take 20 mg by mouth daily.    . sevelamer carbonate (RENVELA) 800 MG tablet Take 800-2,400 mg by mouth See admin instructions. 2400 mg with meals. And 800 mg  with snacks    . oxyCODONE-acetaminophen (PERCOCET) 10-325 MG tablet Take 1 tablet by mouth every 6 (six) hours as needed for pain. (Patient not taking: No sig reported) 12 tablet 0            Current Facility-Administered Medications  Medication Dose Route Frequency Provider Last Rate Last Admin  . 0.9 %  sodium chloride infusion  250 mL Intravenous PRN Marty Heck, MD        REVIEW OF SYSTEMS:  [X]  denotes positive finding, [ ]  denotes negative finding Cardiac  Comments:  Chest pain or chest pressure:    Shortness of breath upon exertion:    Short of breath when lying flat:    Irregular heart rhythm:        Vascular    Pain in calf, thigh, or hip brought on by ambulation:    Pain in feet at night that wakes you up from your sleep:     Blood clot in your veins:    Leg swelling:         Pulmonary    Oxygen at home:    Productive cough:     Wheezing:         Neurologic    Sudden weakness in arms or legs:     Sudden numbness in arms or legs:     Sudden onset of difficulty speaking or slurred speech:    Temporary loss of vision in one eye:     Problems with dizziness:         Gastrointestinal    Blood in stool:     Vomited blood:          Genitourinary    Burning when urinating:     Blood in urine:        Psychiatric    Major depression:         Hematologic    Bleeding problems:    Problems with blood clotting too easily:        Skin    Rashes or ulcers:        Constitutional    Fever or chills:      PHYSICAL EXAM:    Vitals:   07/15/20 0822  BP: 139/72  Pulse: 66  Resp: 16  Temp: 98 F (36.7 C)  TempSrc: Temporal  SpO2: 97%  Weight: 256 lb (116.1 kg)  Height: 6' (1.829 m)    GENERAL: The patient is a well-nourished male, in no acute distress. The vital signs are documented above. CARDIAC: There is a regular rate and rhythm.  VASCULAR:  Right radiocephalic fistula with good thrill PULMONARY: There is good air exchange bilaterally without wheezing or rales. ABDOMEN: Soft and non-tender. MUSCULOSKELETAL: There are no major deformities or cyanosis. NEUROLOGIC: No focal weakness or paresthesias are detected. SKIN: There are no ulcers or rashes noted. PSYCHIATRIC: The patient has a normal affect.  DATA:   Disc from [facility] unable to upload images  Assessment/Plan:  70 year old male presents for evaluation of central venous stenosis versus occlusion given profound right arm swelling in the setting of right radiocephalic AV fistula.  The fistula is working well per the patient but ongoing issues with right arm swelling.  Recent fistulogram by Dr. Augustin Coupe with [facility].  I was able to load the disc from [facility] but could not review any images from his recent fistulogram given the images would not load.  I recommended a central venogram with possible intervention in the Cath Lab  and risks benefits discussed.   Marty Heck, MD [facility] Office: [phone]

## 2020-07-24 NOTE — Progress Notes (Signed)
Site area: Right groin a 7 french sheath was removed  Site Prior to Removal:  Level 0  Pressure Applied For 20 MINUTES    Bedrest Beginning at 1530p  Manual:   Yes.    Patient Status During Pull:  stable  Post Pull Groin Site:  Level 0  Post Pull Instructions Given:  Yes.    Post Pull Pulses Present:  Yes.    Dressing Applied:  Yes.    Comments:

## 2020-07-24 NOTE — Progress Notes (Signed)
Discharge instructions reviewed with pt and his wife (via telephone) both voice understanding.  

## 2020-07-24 NOTE — Discharge Instructions (Signed)
Femoral Site Care  This sheet gives you information about how to care for yourself after your procedure. Your health care provider may also give you more specific instructions. If you have problems or questions, contact your health care provider. What can I expect after the procedure? After the procedure, it is common to have:  Bruising that usually fades within 1-2 weeks.  Tenderness at the site. Follow these instructions at home: Wound care  Follow instructions from your health care provider about how to take care of your insertion site. Make sure you: ? Wash your hands with soap and water before you change your bandage (dressing). If soap and water are not available, use hand sanitizer. ? Change your dressing as told by your health care provider. ? Leave stitches (sutures), skin glue, or adhesive strips in place. These skin closures may need to stay in place for 2 weeks or longer. If adhesive strip edges start to loosen and curl up, you may trim the loose edges. Do not remove adhesive strips completely unless your health care provider tells you to do that.  Do not take baths, swim, or use a hot tub until your health care provider approves.  You may shower 24-48 hours after the procedure or as told by your health care provider. ? Gently wash the site with plain soap and water. ? Pat the area dry with a clean towel. ? Do not rub the site. This may cause bleeding.  Do not apply powder or lotion to the site. Keep the site clean and dry.  Check your femoral site every day for signs of infection. Check for: ? Redness, swelling, or pain. ? Fluid or blood. ? Warmth. ? Pus or a bad smell. Activity  For the first 2-3 days after your procedure, or as long as directed: ? Avoid climbing stairs as much as possible. ? Do not squat.  Do not lift anything that is heavier than 10 lb (4.5 kg), or the limit that you are told, until your health care provider says that it is safe.  Rest as  directed. ? Avoid sitting for a long time without moving. Get up to take short walks every 1-2 hours.  Do not drive for 24 hours if you were given a medicine to help you relax (sedative). General instructions  Take over-the-counter and prescription medicines only as told by your health care provider.  Keep all follow-up visits as told by your health care provider. This is important. Contact a health care provider if you have:  A fever or chills.  You have redness, swelling, or pain around your insertion site. Get help right away if:  The catheter insertion area swells very fast.  You pass out.  You suddenly start to sweat or your skin gets clammy.  The catheter insertion area is bleeding, and the bleeding does not stop when you hold steady pressure on the area.  The area near or just beyond the catheter insertion site becomes pale, cool, tingly, or numb. These symptoms may represent a serious problem that is an emergency. Do not wait to see if the symptoms will go away. Get medical help right away. Call your local emergency services (911 in the [place] Do not drive yourself to the hospital. Summary  After the procedure, it is common to have bruising that usually fades within 1-2 weeks.  Check your femoral site every day for signs of infection.  Do not lift anything that is heavier than 10 lb (4.5 kg), or   the limit that you are told, until your health care provider says that it is safe. This information is not intended to replace advice given to you by your health care provider. Make sure you discuss any questions you have with your health care provider. Document Revised: 02/01/2020 Document Reviewed: 02/01/2020 Elsevier Patient Education  2021 [employer] Inc.  

## 2020-07-24 NOTE — Op Note (Addendum)
Patient name: Matthew Wood. 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  Ultrasound-guided access of right radiocephalic AV fistula 2.  Right upper extremity fistulogram with central venogram 3.  Ultrasound-guided access of the right common femoral vein  4.  Angioplasty of the right subclavian vein and angioplasty/stent of the right innominate vein (predilated with a 8 mm Mustang, 10 mm Mustang, stented with a 12 mm x 40 mm epic, post-dilated with 16 mm Atlas all antegrade from the right arm AVF and additional 10 mm x 39 mm express LD stent retrograde from the right common femoral vein)  5.  138 minutes of monitored moderate conscious sedation time  Indications: Patient is a 70 year old male who is currently dialyzing via right arm radiocephalic AV fistula.  He was sent back to our office for further evaluation of a central venous occlusion after fistulogram by interventional nephrology Dr. Augustin Coupe.  He presents today with complaints of arm and neck swelling.  Risks benefits discussed.  Findings:   Upper extremity fistulogram shows a patent radiocephalic fistula in the forearm that primarily drains into the basilic vein.  On the right he has a subclavian and innominate occlusion with multiple collaterals in the chest wall.  The superior vena cava is patent.  Ultimately I was able to cross the occluded right subclavian and innominate vein from right arm access through the radiocephalic fistula.  This was predilated with an 8 mm Mustang and 10 mm Mustang.  There was significant recoil in the lesion and I elected to primarily stent the innominate stenosis with a 12 mm x 40 mm epic.  Unfortunately the stent migrated after deployment and slid down into the superior vena cava.  I then upsized my sheath  and was able to use a large 46mm Atlas to pull the stent back into the right innominate vein where I then used the 16 mm Atlas balloon to get seal with the stent struts.  The stent was still too short to appropriately treat the lesion.  I then came from the right groin and was able to get into the superior vena cava and through the innominate stent out into the subclavian vein and then from the right femoral vein access placed a second 10 mm x 39 mm express LD balloon expandable stent with good wall apposition in the right innominate vein.  There is no residual stenosis with a patent right subclavian and innominate vein and filling of the superior vena cava with no residual stenosis.   Procedure:  The patient was identified in the holding area and taken to room 8.  The patient was then placed supine on the table and prepped and draped in the usual sterile fashion.  A time out was called.  Ultrasound was used to evaluate the right radiocephalic fistula which was patent and image was saved.  I then accessed this with a micro access needle placed a microwire and micro sheath.  We then got staged imaging of the right radiocephalic fistula including the forearm, upper arm and central venous structures.  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  I then used a Bentson wire to select the basilic vein which was the dominant outflow of the fistula and upsized to a 6 [place] sheath.  Patient  was given 5000 units of IV heparin.  I then used a BER 2 catheter with a Bentson wire and I was actually able to cross the subclavian and innominate occlusion from the arm and get back into the superior vena cava from right arm access.  I confirmed hand-injection I was indeed in the superior vena cava.  That point time we then exchanged for a Rosen wire for more support.  I tried initially to come with a 8 mm Mustang from the sheath in the right arm but it would  not cross the stenosis.  I then elected to downsize to an 018 system and used a V 18 with a 8 mm Sterling and the right subclavian and innominate veins were angioplastied to nominal pressure for 2 minutes.  I then exchanged back for my 035 system and used a 10 mm Mustang.  There was significant recoil in the lesion but there was flow across the occlusion now.  I elected to place a stent and we did have to upsized to a 7 [place] sheath.  I placed 12 mm x 40 mm self-expanding Epic into the right innominate vein from right fistula access and this was deployed across the lesion.  Unfortunately after it was deployed it watermelon seeded and migrated and then ultimately was undersized and slid into the superior vena cava.  I then try to use a 12 mm Mustang to drag the stent back into the innominate that would not work so then I had to use a 16 mm Atlas that was then inflated and the stent that was in the superior vena cava and I was able to direct the stent back into the innominate vein by pulling it with the balloon.  After multiple attempts finally was able to use the 16 mm Atlas to get the stent struts to seal proximally in the innominate vein without moving.  I then was going to place a second balloon mounted stent more proximal but we ultimately lost access given that the Atlas balloon became stuck on the wire.  In an effort not to dislodge the stent I elected to come from the groin and then I evaluated the right common femoral vein it was patent and image was saved.  I then accessed this with a micro access needle, placed a microwire and micro sheath.  Placed a Bentson wire in the right femoral vein and then upsized to a 7 [place] sheath.  I then was able to come up from the groin with a BER 2 catheter through the inferior vena cava into the superior vena cava and then with a Rosen wire was able to cross the innominate stent from groin access and got my wire out the subclavian vein.  I then came from the groin and  placed a second 10 mm x 39 mm balloon expandable express LD stent.  This was to ensure that we got good stent apposition proximally so the stent would not migrate and also to fully treat the lesion.  We then had a widely patent right subclavian and innominate vein on the right with good filling of the superior vena cava.  Very happy with the results wires and catheters were removed.  I tied down a 4-0 Monocryl around the sheath in the right arm fistula and it was removed.  Taken to holding to have the sheath removed from his groin.  Plan: Follow-up in 1 month to see how he is doing  Marty Heck, MD [facility] Office: [place]

## 2020-07-25 ENCOUNTER — Telehealth: Payer: Self-pay | Admitting: *Deleted

## 2020-07-25 ENCOUNTER — Encounter (HOSPITAL_COMMUNITY): Payer: Self-pay | Admitting: Vascular Surgery

## 2020-07-25 DIAGNOSIS — G4733 Obstructive sleep apnea (adult) (pediatric): Secondary | ICD-10-CM

## 2020-07-25 LAB — POCT ACTIVATED CLOTTING TIME: Activated Clotting Time: 201 seconds

## 2020-07-25 MED FILL — Lidocaine HCl Local Preservative Free (PF) Inj 1%: INTRAMUSCULAR | Qty: 30 | Status: AC

## 2020-07-25 NOTE — Telephone Encounter (Signed)
Order placed to [facility]

## 2020-07-25 NOTE — Telephone Encounter (Signed)
-----   Message from Sueanne Margarita, MD sent at 07/20/2020 10:48 PM EST ----- NIna  Please get this patient an appt with his DME to get his PAP maks fitted he is not using his device because the mask does not fit well  Traci ----- Message ----- From: Constance Haw, MD Sent: 07/17/2020   3:54 PM EST To: Sueanne Margarita, MD  Saw Mr. Matthew Wood today.  He is currently short of breath with exertion.  He is not volume overloaded by exam or by OptiVol.  He is also on dialysis to manage his volume status.  He did tell me that he only is wearing his CPAP once or twice a week.  Thanks.

## 2020-07-27 NOTE — Progress Notes (Signed)
Virtual Visit via Telephone Note   This visit type was conducted due to national recommendations for restrictions regarding the COVID-19 Pandemic (e.g. social distancing) in an effort to limit this patient's exposure and mitigate transmission in our community.  Due to his co-morbid illnesses, this patient is at least at moderate risk for complications without adequate follow up.  This format is felt to be most appropriate for this patient at this time.  The patient did not have access to video technology/had technical difficulties with video requiring transitioning to audio format only (telephone).  All issues noted in this document were discussed and addressed.  No physical exam could be performed with this format.  Please refer to the patient's chart for his  consent to telehealth for [facility].    Date:  07/27/2020   ID:  Matthew Wood., DOB 1950-11-05, MRN [id number] The patient was identified using 2 identifiers.  Patient Location: Home Provider Location: Home Office   PCP:  Matthew Wood, [place]  Cardiologist:  Matthew Grooms, MD  Advanced Practice Provider:  Tommie Raymond, NP :[phone]}   Evaluation Performed:  Follow-Up Visit  Chief Complaint:  Follow up  History of Present Illness:    Matthew Wood. is a 70 y.o. male with a history of CAD, chronic combined systolic and diastolic CHF, ESRD on HD MWF schedule, CVA, HTN, HLD, LBBB, history of CVA, OSA on CPAP, PPM placement, and DM2.   He underwent cardiac catheterization 2011 which showed significant CAD with recommendations to manage medically. Medtronic CRT-D implanted 06/03/2016 followed by Dr. Curt Bears for poor LV function.  Last echocardiogram 08/2019 with LVEF at 25 to 30% with G1 DD, severe LVE, moderate LVH, mildly thickened aortic root, mild AI, moderate LAE, mild RVE and small pericardial effusion. Last remote device download 06/05/2020 with no arrhythmias noted as  he was last seen in follow-up with general cardiology 01/29/2020 for the evaluation of palpitations during dialysis.  Today he reports he has been doing very well from a CV standpoint.  He denies recurrent palpitations.  He has had no anginal symptoms, LE edema, shortness of breath, orthopnea, nausea, dizziness or syncope.  He does have some mild, baseline dyspnea however he feels this is due to poor exercise tolerance and has been relatively unchanged over the last several months.  He is also reportedly lost approximately 50 pounds since initiation of dialysis which he feels like could also be contributing.  We discussed repeat echocardiogram versus stress testing however patient prefers to monitor symptoms for now and if symptom worsening, consider the above.  We did discuss plan to increase exercise activity to differentiate.  He is tolerating HD okay.  No issues with medications.  BP has been stable.  Overall is doing well.  The patient does not have symptoms concerning for COVID-19 infection (fever, chills, cough, or new shortness of breath).    Past Medical History:  Diagnosis Date  . Acute respiratory failure ([place]) 10/04/2017  . Anemia   . Anemia in chronic kidney disease 09/29/2019  . Arthritis of right knee 10/05/2017  . CAD (coronary artery disease), native coronary artery 11/15/2013   70-80% mid RCA with marked ectasia.   . Cardiorenal syndrome with renal failure 10/04/2017  . Carotid aneurysm, left ([place]) 11/15/2013  . CHF (congestive heart failure) ([place])   . CKD (chronic kidney disease), stage IV ([place]) 07/20/2016  . CKD (chronic kidney disease), stage V ([place])  03/27/2019  . Coagulation defect, unspecified ([place]) 09/29/2019  . Coronary artery disease    a. LHC 04/27/10 left main patent, 30-40% LAD, mid circumflex 30%, 80% mid RCA accounting for the appearance of an infract/peri-infract ischemia on nuclear testing, LV EF of 30-45%--> medical therapy  . CVA (cerebrovascular accident) ([place]) 2011    potine; "little balance problems since" (05/25/2016)  . Dyspnea   . Elevated troponin 10/04/2017  . End stage renal disease ([place]) 09/29/2019  . Erectile dysfunction 11/15/2013  . Essential hypertension 11/15/2013  . Gout   . Gout, unspecified 10/04/2019  . Hematuria 10/05/2017  . History of kidney stones    "passed them" (05/25/2016)  . Hyperlipidemia   . Hypertension   . Iron deficiency anemia, unspecified 09/29/2019  . LBBB (left bundle branch block)   . Left pontine CVA ([place]) 11/15/2013  . Multinodular goiter   . Obesity   . OSA (obstructive sleep apnea) 11/15/2013  . OSA on CPAP   . PONV (postoperative nausea and vomiting)    diarrhea  . Presence of permanent cardiac pacemaker   . Renal insufficiency   . Right upper lobe pneumonia 10/04/2017  . Secondary hyperparathyroidism of renal origin ([place]) 09/29/2019  . Type 2 diabetes mellitus with nephropathy ([place]) 10/05/2017  . Type II diabetes mellitus ([place])   . Unspecified sequelae of cerebral infarction 10/04/2019  . Wears partial dentures    Past Surgical History:  Procedure Laterality Date  . A/V FISTULAGRAM Right 01/31/2020   Procedure: A/V FISTULAGRAM;  Surgeon: Marty Heck, MD;  Location: [place] [facility];  Service: Cardiovascular;  Laterality: Right;  . AV FISTULA PLACEMENT Right 05/25/2019   Procedure: ARTERIOVENOUS (AV) FISTULA CREATION RIGHT ARM;  Surgeon: Marty Heck, MD;  Location: [place];  Service: Vascular;  Laterality: Right;  . COLONOSCOPY WITH PROPOFOL N/A 04/16/2014   Procedure: COLONOSCOPY WITH PROPOFOL;  Surgeon: [name], MD;  Location: [facility];  Service: Endoscopy;  Laterality: N/A;  . EP IMPLANTABLE DEVICE N/A 05/25/2016   Procedure: BiV ICD Insertion CRT-D;  Surgeon: [name], MD;  Location: [place] [facility];  Service: Cardiovascular;  Laterality: N/A;  . INSERT / REPLACE / REMOVE PACEMAKER  05/25/2016   biventricular pacemaker  . LAPAROSCOPIC CHOLECYSTECTOMY    . PERIPHERAL  VASCULAR INTERVENTION N/A 07/24/2020   Procedure: PERIPHERAL VASCULAR INTERVENTION;  Surgeon: Marty Heck, MD;  Location: [place] [facility];  Service: Cardiovascular;  Laterality: N/A;  central fistula stent  . REVISON OF ARTERIOVENOUS FISTULA Right 02/14/2020   Procedure: RIGHT ARM ARTERIOVENOUS FISTULA REVISON WITH SIDE BRANCH LIGATION VERSUS CONVERSION TO UPPER ARM FISTULA;  Surgeon: Marty Heck, MD;  Location: [place];  Service: Vascular;  Laterality: Right;  . UPPER EXTREMITY VENOGRAPHY N/A 07/24/2020   Procedure: CENTRAL VENOGRAPHY;  Surgeon: Marty Heck, MD;  Location: [place] [facility];  Service: Cardiovascular;  Laterality: N/A;     No outpatient medications have been marked as taking for the 07/29/20 encounter (Appointment) with Matthew Raymond, NP.   Current Facility-Administered Medications for the 07/29/20 encounter (Appointment) with Matthew Raymond, NP  Medication  . 0.9 %  sodium chloride infusion     Allergies:   Cinacalcet hcl   Social History   Tobacco Use  . Smoking status: Former Smoker    Packs/day: 0.50    Years: 27.00    Pack years: 13.50    Types: Cigarettes    Quit date: 03/25/2010    Years since  quitting: 10.3  . Smokeless tobacco: Never Used  Vaping Use  . Vaping Use: Never used  Substance Use Topics  . Alcohol use: Yes    Comment: rare  . Drug use: No     Family Hx: The patient's family history includes Breast cancer in his mother; Diabetes type II in his father; Heart attack in his father; Hypertension in his mother and sister; Stroke in his father.  ROS:   Please see the history of present illness.    All other systems reviewed and are negative.  Labs/Other Tests and Data Reviewed:    EKG:  No ECG reviewed.  Recent Labs: 09/18/2019: ALT 21; Platelets 135 07/24/2020: BUN 38; Creatinine, Ser 6.70; Hemoglobin 12.2; Potassium 3.5; Sodium 138   Recent Lipid Panel Lab Results  Component Value Date/Time   CHOL   02/08/2010 06:00 AM    171        ATP III CLASSIFICATION:  <200     mg/dL   Desirable  200-239  mg/dL   Borderline High  >=240    mg/dL   High          TRIG 248 (H) 02/08/2010 06:00 AM   HDL 34 (L) 02/08/2010 06:00 AM   CHOLHDL 5.0 02/08/2010 06:00 AM   LDLCALC  02/08/2010 06:00 AM    87        Total Cholesterol/HDL:CHD Risk Coronary Heart Disease Risk Table                     Men   Women  1/2 Average Risk   3.4   3.3  Average Risk       5.0   4.4  2 X Average Risk   9.6   7.1  3 X Average Risk  23.4   11.0        Use the calculated Patient Ratio above and the CHD Risk Table to determine the patient's CHD Risk.        ATP III CLASSIFICATION (LDL):  <100     mg/dL   Optimal  100-129  mg/dL   Near or Above                    Optimal  130-159  mg/dL   Borderline  160-189  mg/dL   High  >190     mg/dL   Very High    Wt Readings from Last 3 Encounters:  07/24/20 250 lb (113.4 kg)  07/17/20 258 lb 12.8 oz (117.4 kg)  07/15/20 256 lb (116.1 kg)     Objective:    Vital Signs:  There were no vitals taken for this visit.   VITAL SIGNS:  reviewed GEN:  no acute distress NEURO:  alert and oriented x 3, no obvious focal deficit  ASSESSMENT & PLAN:    1.  CAD: -LHC 2011 with moderate nonocclusive CAD with recommendations to manage medically -Denies anginal symptoms -Does report some mild dyspnea however this is unchanged in several months with plan to increase exercise tolerance with close follow-up.  If symptoms persist may consider stress testing at that time -Continue ASA, carvedilol and Crestor  2. ESRD: -On MWF schedule -Has lost approximately 50 pounds since HD initiation  3.  Chronic combined systolic and diastolic CHF: -Volume management by HD -Not currently on diuretic therapy  4. S/p ICD: -No issue, followed by Dr. Curt Bears  5.  OSA: -Dr. Radford Pax, needs CPAP placement?  6.  HTN: -Stable, no change  7.  HLD: -Followed by Dr. Delfina Redwood     COVID-19  Education: The signs and symptoms of COVID-19 were discussed with the patient and how to seek care for testing (follow up with PCP or arrange E-visit). The importance of social distancing was discussed today.  Time:   Today, I have spent 20 minutes with the patient with telehealth technology discussing the above problems.     Medication Adjustments/Labs and Tests Ordered: Current medicines are reviewed at length with the patient today.  Concerns regarding medicines are outlined above.   Tests Ordered: No orders of the defined types were placed in this encounter.   Medication Changes: No orders of the defined types were placed in this encounter.   Follow Up:  Dr. Tamala Julian or myself in 3 months   Signed, Kathyrn Drown, NP  07/27/2020 6:37 AM    [place]

## 2020-07-29 ENCOUNTER — Encounter (HOSPITAL_COMMUNITY): Payer: Self-pay | Admitting: Vascular Surgery

## 2020-07-29 ENCOUNTER — Telehealth (INDEPENDENT_AMBULATORY_CARE_PROVIDER_SITE_OTHER): Payer: Medicare Other | Admitting: Cardiology

## 2020-07-29 ENCOUNTER — Other Ambulatory Visit: Payer: Self-pay

## 2020-07-29 VITALS — BP 120/50 | Ht 72.0 in | Wt 250.0 lb

## 2020-07-29 DIAGNOSIS — I255 Ischemic cardiomyopathy: Secondary | ICD-10-CM

## 2020-07-29 DIAGNOSIS — I10 Essential (primary) hypertension: Secondary | ICD-10-CM

## 2020-07-29 DIAGNOSIS — E7849 Other hyperlipidemia: Secondary | ICD-10-CM | POA: Diagnosis not present

## 2020-07-29 DIAGNOSIS — N186 End stage renal disease: Secondary | ICD-10-CM

## 2020-07-29 DIAGNOSIS — I120 Hypertensive chronic kidney disease with stage 5 chronic kidney disease or end stage renal disease: Secondary | ICD-10-CM

## 2020-07-29 DIAGNOSIS — I2510 Atherosclerotic heart disease of native coronary artery without angina pectoris: Secondary | ICD-10-CM

## 2020-07-29 NOTE — Patient Instructions (Signed)
Medication Instructions:  Your physician recommends that you continue on your current medications as directed. Please refer to the Current Medication list given to you today.  *If you need a refill on your cardiac medications before your next appointment, please call your pharmacy*   Lab Work: NONE If you have labs (blood work) drawn today and your tests are completely normal, you will receive your results only by: Marland Kitchen MyChart Message (if you have MyChart) OR . A paper copy in the mail If you have any lab test that is abnormal or we need to change your treatment, we will call you to review the results.   Testing/Procedures: NONE   Follow-Up: At [facility], you and your health needs are our priority.  As part of our continuing mission to provide you with exceptional heart care, we have created designated Provider Care Teams.  These Care Teams include your primary Cardiologist (physician) and Advanced Practice Providers (APPs -  Physician Assistants and Nurse Practitioners) who all work together to provide you with the care you need, when you need it.  We recommend signing up for the patient portal called "MyChart".  Sign up information is provided on this After Visit Summary.  MyChart is used to connect with patients for Virtual Visits (Telemedicine).  Patients are able to view lab/test results, encounter notes, upcoming appointments, etc.  Non-urgent messages can be sent to your provider as well.   To learn more about what you can do with MyChart, go to [email or web address].    Your next appointment:   3 month(s)  The format for your next appointment:   In Person  Provider:   You may see Sinclair Grooms, MD or one of the following Advanced Practice Providers on your designated Care Team:    Kathyrn Drown, NP

## 2020-08-30 IMAGING — US US THYROID
1 series · 13 of 25 positions shown · non-contrast
Comparison: Multiple prior thyroid ultrasounds including 10/24/2014
and 09/13/2013

CLINICAL DATA: Goiter. 67-year-old male with a history of
multinodular goiter. The dominant mass in the left thyroid gland was
previously biopsied in Tuesday February, 2013.

EXAM:
THYROID ULTRASOUND
TECHNIQUE: Ultrasound examination of the thyroid gland and adjacent soft
tissues was performed.

[Series 1: us thyroid · 0.07mm/px · 13 of 55 slices shown]
[im 1/55]
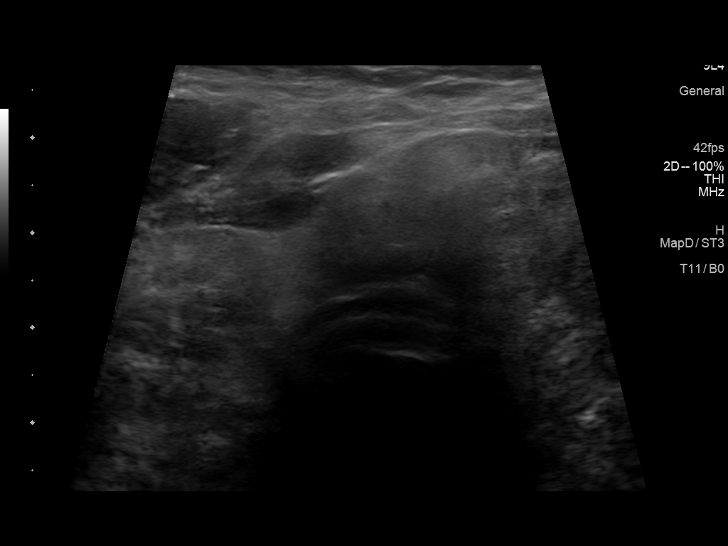
[im 5/55]
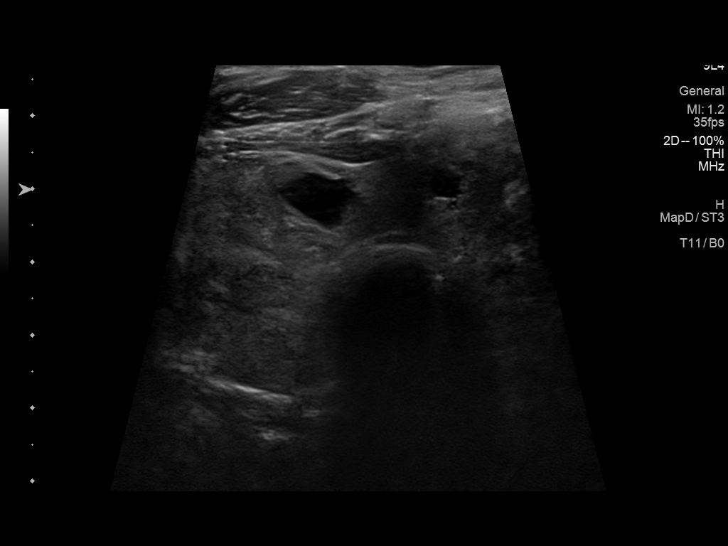
[im 10/55]
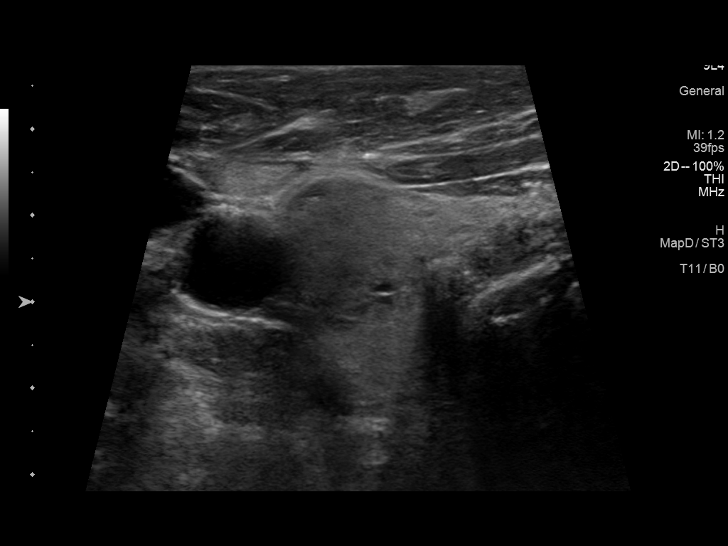
[im 14/55]
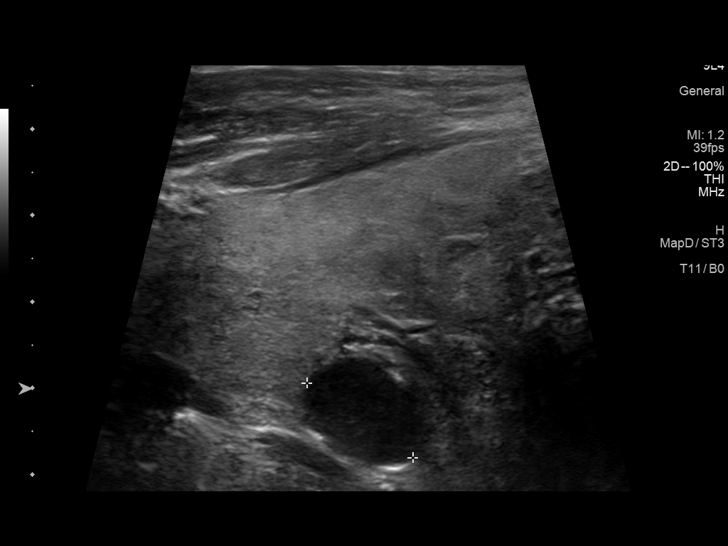
[im 19/55]
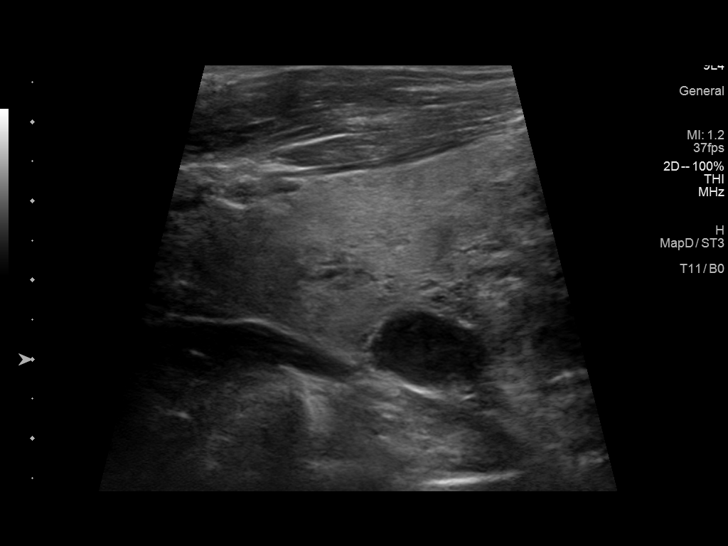
[im 23/55]
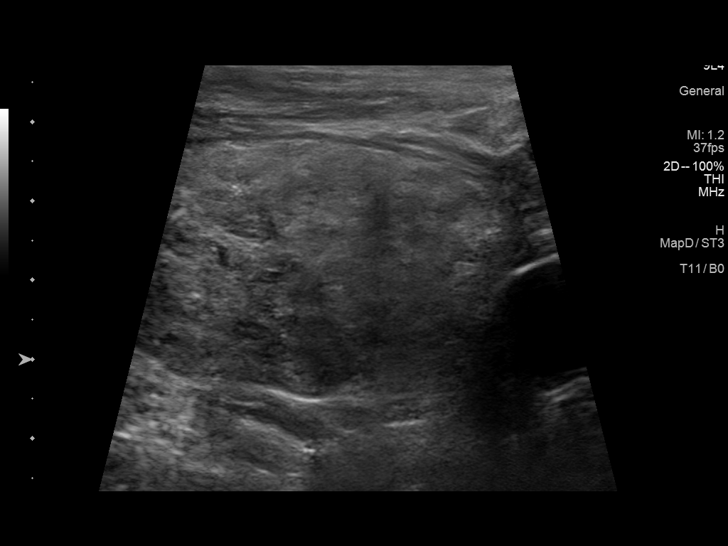
[im 28/55]
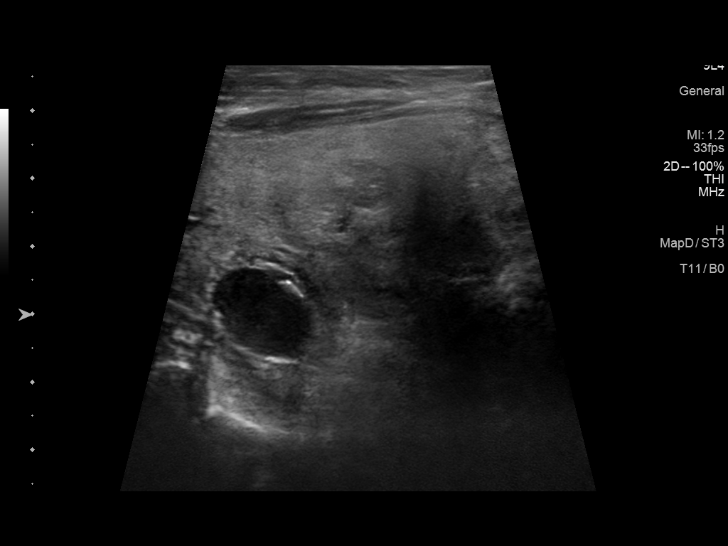
[im 32/55]
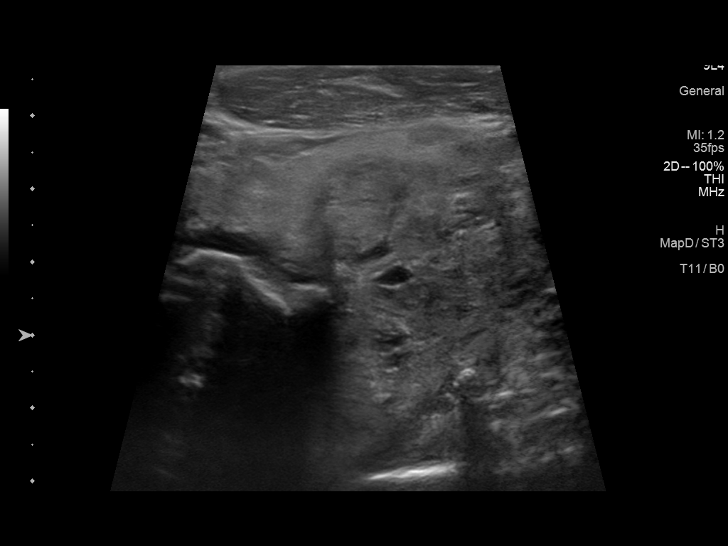
[im 37/55]
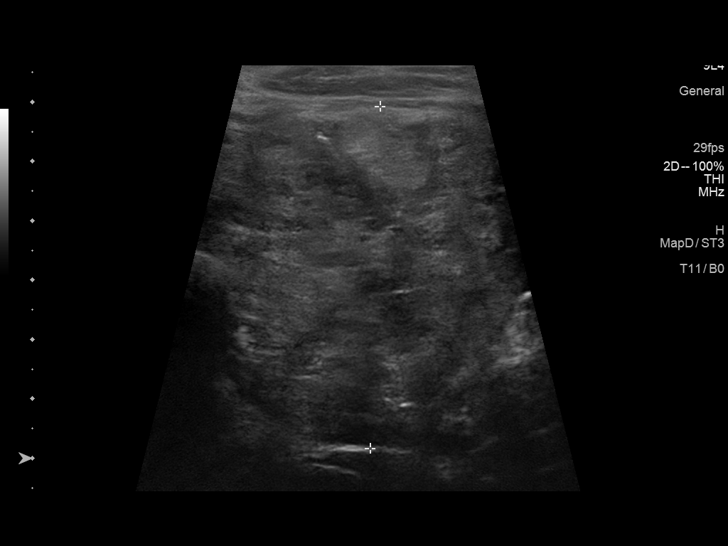
[im 41/55]
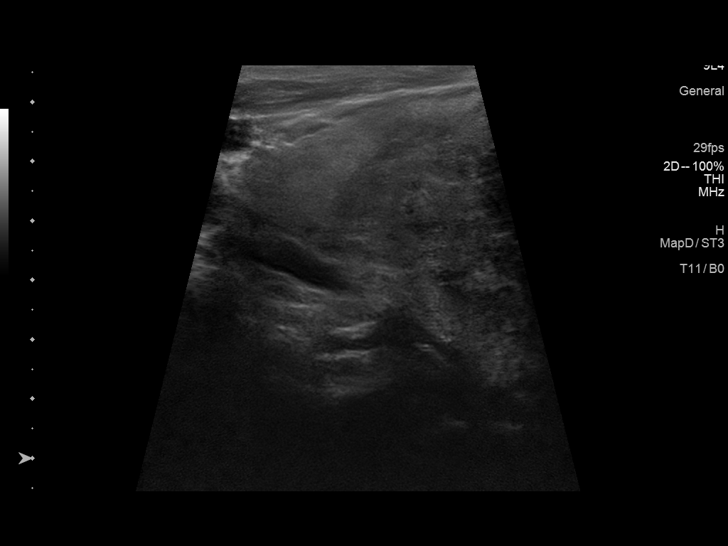
[im 46/55]
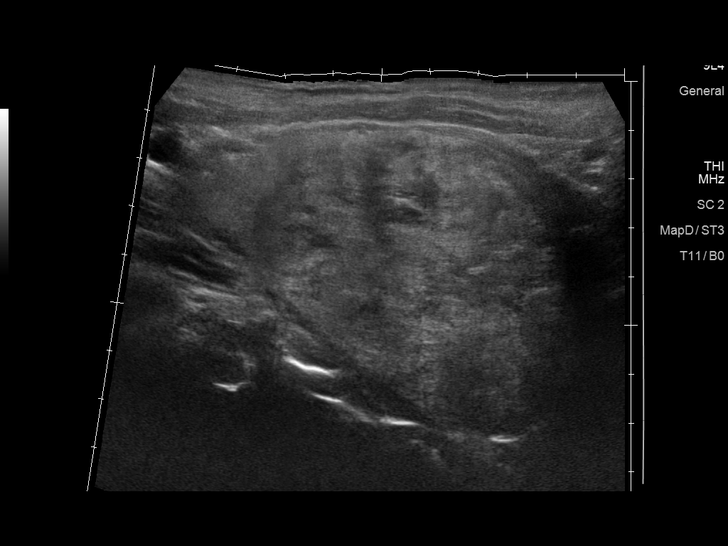
[im 50/55]
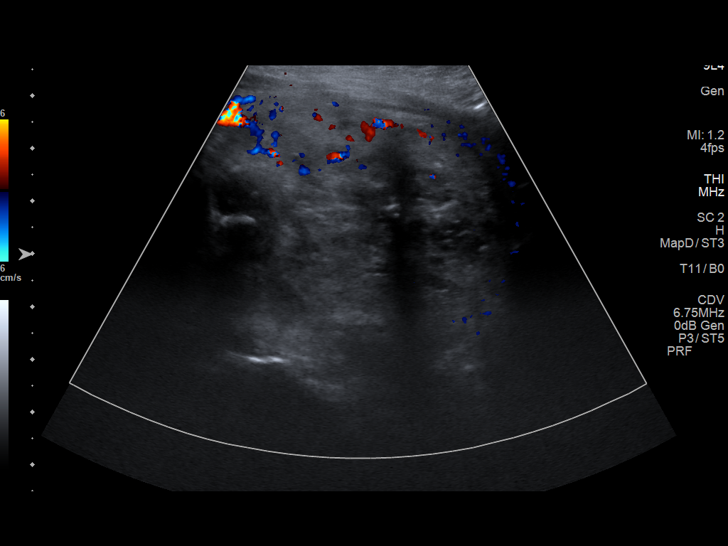
[im 55/55]
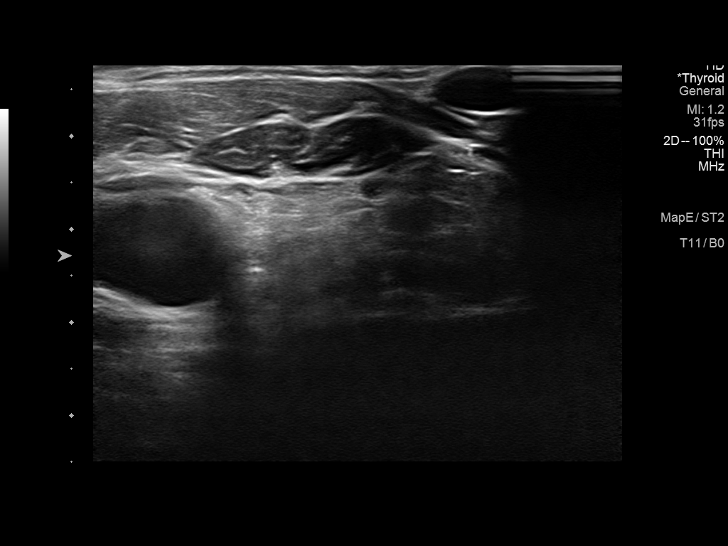

[13 of 25 positions shown; findings below may reference images not displayed]

FINDINGS: Parenchymal Echotexture: Markedly heterogenous

Isthmus: 1.2 cm

Right lobe: 7.3 x 3.4 x 3.1 cm

Left lobe: 8.9 x 5.0 x 5.1 cm

_________________________________________________________

Estimated total number of nodules >/= 1 cm: 3

Number of spongiform nodules >/=  2 cm not described below (TR1): 0

Number of mixed cystic and solid nodules >/= 1.5 cm not described
below (TR2): 0

_________________________________________________________

Nodule # 1: Small slightly irregular cyst within the thyroid isthmus
measures up to 1.2 cm. This has decreased in size compared to 2.0 cm
on prior imaging and is almost certainly benign. No further
follow-up required.

Nodule # 2: A circumscribed anechoic cyst is present posterior to
the right mid thyroid gland measuring up to 1.5 cm. This simple cyst
is sonographically benign and requires no further follow-up.

Nodule # 3: Heterogeneous isoechoic mass occupying the majority of
the left thyroid gland measures 7.3 x 5.5 x 5.1 cm. This lesion was
previously biopsied. Of note, the mass measures approximately 7.9 x
5.2 x 5.4 cm in Thursday September, 2013 confirming 5 year stability.
IMPRESSION: 1. Confirmed greater than 5 year stability of the previously
biopsied left thyroid mass consistent with a benign process.
2. Additional sonographically benign thyroid cysts identified in the
isthmus and right mid gland. No further follow-up required.

The above is in keeping with the ACR TI-RADS recommendations - [HOSPITAL] 1351;[DATE].

## 2020-09-04 ENCOUNTER — Ambulatory Visit (INDEPENDENT_AMBULATORY_CARE_PROVIDER_SITE_OTHER): Payer: Medicare Other

## 2020-09-04 DIAGNOSIS — I255 Ischemic cardiomyopathy: Secondary | ICD-10-CM | POA: Diagnosis not present

## 2020-09-04 LAB — CUP PACEART REMOTE DEVICE CHECK
Battery Remaining Longevity: 27 mo
Battery Voltage: 2.95 V
Brady Statistic AP VP Percent: 17.05 %
Brady Statistic AP VS Percent: 0.23 %
Brady Statistic AS VP Percent: 79.02 %
Brady Statistic AS VS Percent: 3.7 %
Brady Statistic RA Percent Paced: 16.72 %
Brady Statistic RV Percent Paced: 83.55 %
Date Time Interrogation Session: 20220324033522
HighPow Impedance: 60 Ohm
Implantable Lead Implant Date: 20171212
Implantable Lead Implant Date: 20171212
Implantable Lead Implant Date: 20171212
Implantable Lead Location: 753858
Implantable Lead Location: 753859
Implantable Lead Location: 753860
Implantable Lead Model: 4298
Implantable Lead Model: 5076
Implantable Pulse Generator Implant Date: 20171212
Lead Channel Impedance Value: 118.56 Ohm
Lead Channel Impedance Value: 118.56 Ohm
Lead Channel Impedance Value: 136.8 Ohm
Lead Channel Impedance Value: 143.419
Lead Channel Impedance Value: 143.419
Lead Channel Impedance Value: 228 Ohm
Lead Channel Impedance Value: 247 Ohm
Lead Channel Impedance Value: 247 Ohm
Lead Channel Impedance Value: 285 Ohm
Lead Channel Impedance Value: 304 Ohm
Lead Channel Impedance Value: 342 Ohm
Lead Channel Impedance Value: 361 Ohm
Lead Channel Impedance Value: 399 Ohm
Lead Channel Impedance Value: 399 Ohm
Lead Channel Impedance Value: 399 Ohm
Lead Channel Impedance Value: 418 Ohm
Lead Channel Impedance Value: 475 Ohm
Lead Channel Impedance Value: 475 Ohm
Lead Channel Pacing Threshold Amplitude: 0.625 V
Lead Channel Pacing Threshold Amplitude: 0.875 V
Lead Channel Pacing Threshold Amplitude: 1 V
Lead Channel Pacing Threshold Pulse Width: 0.4 ms
Lead Channel Pacing Threshold Pulse Width: 0.4 ms
Lead Channel Pacing Threshold Pulse Width: 0.4 ms
Lead Channel Sensing Intrinsic Amplitude: 13 mV
Lead Channel Sensing Intrinsic Amplitude: 13 mV
Lead Channel Sensing Intrinsic Amplitude: 2.875 mV
Lead Channel Sensing Intrinsic Amplitude: 2.875 mV
Lead Channel Setting Pacing Amplitude: 1.5 V
Lead Channel Setting Pacing Amplitude: 2 V
Lead Channel Setting Pacing Amplitude: 2.5 V
Lead Channel Setting Pacing Pulse Width: 0.4 ms
Lead Channel Setting Pacing Pulse Width: 0.4 ms
Lead Channel Setting Sensing Sensitivity: 0.3 mV

## 2020-09-16 NOTE — Progress Notes (Signed)
Remote ICD transmission.   

## 2020-11-08 NOTE — Progress Notes (Deleted)
Cardiology Office Note   Date:  11/08/2020   ID:  Matthew Wood., DOB [birth year], MRN [id number]  PCP:  Seward Carol, MD  Cardiologist: Dr. Daneen Schick, MD  No chief complaint on file.    History of Present Illness: Matthew Wood. is a 70 y.o. male who presents for 53-month follow-up, seen for Dr. Tamala Julian.  Mr. Levi has a history of CAD, chronic combined systolic and diastolic CHF, ESRD on HD MWF schedule, CVA, HTN, HLD, LBBB, history of CVA, OSA on CPAP, PPM placement, and DM2.   He underwent cardiac catheterization 2011 which showed significant CAD with recommendations to manage medically. Medtronic CRT-D implanted 06/03/2016 followed by Dr. Curt Bears for poor LV function. Last echocardiogram 08/2019 with LVEF at 25 to 30% with G1 DD, severe LVE, moderate LVH, mildly thickened aortic root, mild AI, moderate LAE, mild RVE and small pericardial effusion. Last remote device download 06/05/2020 with no arrhythmias noted as he was last seen in follow-up with general cardiology 01/29/2020 for the evaluation of palpitations during dialysis.  He was last seen by myself 07/29/2020 in telemedicine follow-up at which time he reported some mild baseline dyspnea however felt this was due to poor exercise tolerance was essentially unchanged over the last several months leading to that appointment.  He also reportedly lost approximately 50 pounds since initiation of dialysis which he feels like could also be contributing.  Plan at that time was to repeat his echocardiogram versus stress testing however patient preferred to to monitor symptoms at that time with intention to increase his exercise activity for better exercise tolerance.  LINQ??  Last remote download with no arrhythmias 09/04/2020.   1.  CAD: -LHC 2011 with moderate nonocclusive CAD with recommendations to manage medically -Denies anginal symptoms -Does report some mild dyspnea however this is unchanged in several months with  plan to increase exercise tolerance with close follow-up.  If symptoms persist may consider stress testing at that time -Continue ASA, carvedilol and Crestor  2. ESRD: -On MWF schedule -Has lost approximately 50 pounds since HD initiation  3.  Chronic combined systolic and diastolic CHF: -Volume management by HD -Not currently on diuretic therapy -Continue Imdur, hydralazine -Likely unable to tolerate other guideline directed medical therapy given BP?  4. S/p ICD: -No issue, followed by Dr. Curt Bears -Medtronic CRT -Last download with no arrhythmias  5.  OSA: -Dr. Radford Pax, needs CPAP placement?  6.  HTN: -Stable, no change  7.  HLD: -Followed by Dr. Delfina Redwood  Past Medical History:  Diagnosis Date  . Acute respiratory failure ([place]) 10/04/2017  . Anemia   . Anemia in chronic kidney disease 09/29/2019  . Arthritis of right knee 10/05/2017  . CAD (coronary artery disease), native coronary artery 11/15/2013   70-80% mid RCA with marked ectasia.   . Cardiorenal syndrome with renal failure 10/04/2017  . Carotid aneurysm, left ([place]) 11/15/2013  . CHF (congestive heart failure) ([place])   . CKD (chronic kidney disease), stage IV ([place]) 07/20/2016  . CKD (chronic kidney disease), stage V ([place]) 03/27/2019  . Coagulation defect, unspecified ([place]) 09/29/2019  . Coronary artery disease    a. LHC 04/27/10 left main patent, 30-40% LAD, mid circumflex 30%, 80% mid RCA accounting for the appearance of an infract/peri-infract ischemia on nuclear testing, LV EF of 30-45%--> medical therapy  . CVA (cerebrovascular accident) ([place]) 2011   potine; "little balance problems since" (05/25/2016)  . Dyspnea   . Elevated troponin 10/04/2017  .  End stage renal disease ([place]) 09/29/2019  . Erectile dysfunction 11/15/2013  . Essential hypertension 11/15/2013  . Gout   . Gout, unspecified 10/04/2019  . Hematuria 10/05/2017  . History of kidney stones    "passed them" (05/25/2016)  . Hyperlipidemia   . Hypertension    . Iron deficiency anemia, unspecified 09/29/2019  . LBBB (left bundle branch block)   . Left pontine CVA ([place]) 11/15/2013  . Multinodular goiter   . Obesity   . OSA (obstructive sleep apnea) 11/15/2013  . OSA on CPAP   . PONV (postoperative nausea and vomiting)    diarrhea  . Presence of permanent cardiac pacemaker   . Renal insufficiency   . Right upper lobe pneumonia 10/04/2017  . Secondary hyperparathyroidism of renal origin ([place]) 09/29/2019  . Type 2 diabetes mellitus with nephropathy ([place]) 10/05/2017  . Type II diabetes mellitus ([place])   . Unspecified sequelae of cerebral infarction 10/04/2019  . Wears partial dentures     Past Surgical History:  Procedure Laterality Date  . A/V FISTULAGRAM Right 01/31/2020   Procedure: A/V FISTULAGRAM;  Surgeon: Marty Heck, MD;  Location: [facility];  Service: Cardiovascular;  Laterality: Right;  . AV FISTULA PLACEMENT Right 05/25/2019   Procedure: ARTERIOVENOUS (AV) FISTULA CREATION RIGHT ARM;  Surgeon: Marty Heck, MD;  Location: [facility];  Service: Vascular;  Laterality: Right;  . COLONOSCOPY WITH PROPOFOL N/A 04/16/2014   Procedure: COLONOSCOPY WITH PROPOFOL;  Surgeon: [name], MD;  [facility];  Service: Endoscopy;  Laterality: N/A;  . EP IMPLANTABLE DEVICE N/A 05/25/2016   Procedure: BiV ICD Insertion CRT-D;  Surgeon: [name], MD;  Location: [facility];  Service: Cardiovascular;  Laterality: N/A;  . INSERT / REPLACE / REMOVE PACEMAKER  05/25/2016   biventricular pacemaker  . LAPAROSCOPIC CHOLECYSTECTOMY    . PERIPHERAL VASCULAR INTERVENTION N/A 07/24/2020   Procedure: PERIPHERAL VASCULAR INTERVENTION;  Surgeon: Marty Heck, MD;  Location: [facility];  Service: Cardiovascular;  Laterality: N/A;  central fistula stent  . REVISON OF ARTERIOVENOUS FISTULA Right 02/14/2020   Procedure: RIGHT ARM ARTERIOVENOUS FISTULA REVISON WITH SIDE BRANCH LIGATION VERSUS CONVERSION TO  UPPER ARM FISTULA;  Surgeon: Marty Heck, MD;  Location: [facility];  Service: Vascular;  Laterality: Right;  . UPPER EXTREMITY VENOGRAPHY N/A 07/24/2020   Procedure: CENTRAL VENOGRAPHY;  Surgeon: Marty Heck, MD;  Location: [facility];  Service: Cardiovascular;  Laterality: N/A;     Current Outpatient Medications  Medication Sig Dispense Refill  . allopurinol (ZYLOPRIM) 300 MG tablet Take 300 mg by mouth daily.    Marland Kitchen aspirin EC 81 MG tablet Take 81 mg by mouth at bedtime.    Marland Kitchen b complex-vitamin c-folic acid (NEPHRO-VITE) 0.8 MG TABS tablet Take 1 tablet by mouth daily.     . carvedilol (COREG) 25 MG tablet Take 25 mg by mouth See admin instructions. TAKE 1 TABLET (25 MG) TUESDAYS, THURSDAYS, SATURDAYS & SUNDAYS BY MOUTH TWICE DAILY TAKE 1 TABLET (25 MG) MONDAYS, WEDNESDAYS, & FRIDAYS BY MOUTH IN THE EVENING ONLY    . HUMALOG KWIKPEN 100 UNIT/ML KiwkPen Inject 15 Units into the skin daily after breakfast.   5  . hydrALAZINE (APRESOLINE) 25 MG tablet Take 1 tablet (25 mg total) by mouth in the morning and at bedtime. 60 tablet 6  . insulin glargine (LANTUS) 100 UNIT/ML injection Inject 28 Units into the skin at bedtime.     . isosorbide  mononitrate (IMDUR) 30 MG 24 hr tablet Take 1 tablet (30 mg total) by mouth daily. 30 tablet 6  . lidocaine-prilocaine (EMLA) cream Apply 1 application topically as needed (prior to port being accessed).    . NON FORMULARY CPAP: At bedtime    . rosuvastatin (CRESTOR) 20 MG tablet Take 20 mg by mouth at bedtime.    . sevelamer carbonate (RENVELA) 800 MG tablet Take 800-2,400 mg by mouth See admin instructions. TAKE 2 TABLETS (1600 MG) BY MOUTH WITH EACH MEAL & TAKE 1 TABLET (800 MG) BY MOUTH WITH A SNACK.     Current Facility-Administered Medications  Medication Dose Route Frequency Provider Last Rate Last Admin  . 0.9 %  sodium chloride infusion  250 mL Intravenous PRN Marty Heck, MD        Allergies:   Cinacalcet hcl     Social History:  The patient  reports that he quit smoking about 10 years ago. His smoking use included cigarettes. He has a 13.50 pack-year smoking history. He has never used smokeless tobacco. He reports current alcohol use. He reports that he does not use drugs.   Family History:  The patient's ***family history includes Breast cancer in his mother; Diabetes type II in his father; Heart attack in his father; Hypertension in his mother and sister; Stroke in his father.    ROS:  Please see the history of present illness.   Otherwise, review of systems are positive for {NONE DEFAULTED:18576::"none"}.   All other systems are reviewed and negative.    PHYSICAL EXAM: VS:  There were no vitals taken for this visit. , BMI There is no height or weight on file to calculate BMI. GEN: Well nourished, well developed, in no acute distress HEENT: normal Neck: no JVD, carotid bruits, or masses Cardiac: ***RRR; no murmurs, rubs, or gallops,no edema  Respiratory:  clear to auscultation bilaterally, normal work of breathing GI: soft, nontender, nondistended, + BS MS: no deformity or atrophy Skin: warm and dry, no rash Neuro:  Strength and sensation are intact Psych: euthymic mood, full affect   EKG:  EKG {ACTION; IS/IS ZOX:09604540} ordered today. The ekg ordered today demonstrates ***   Recent Labs: 07/24/2020: BUN 38; Creatinine, Ser 6.70; Hemoglobin 12.2; Potassium 3.5; Sodium 138    Lipid Panel    Component Value Date/Time   CHOL  02/08/2010 0600    171        ATP III CLASSIFICATION:  <200     mg/dL   Desirable  200-239  mg/dL   Borderline High  >=240    mg/dL   High          TRIG 248 (H) 02/08/2010 0600   HDL 34 (L) 02/08/2010 0600   CHOLHDL 5.0 02/08/2010 0600   VLDL 50 (H) 02/08/2010 0600   LDLCALC  02/08/2010 0600    87        Total Cholesterol/HDL:CHD Risk Coronary Heart Disease Risk Table                     Men   Women  1/2 Average Risk   3.4   3.3  Average Risk        5.0   4.4  2 X Average Risk   9.6   7.1  3 X Average Risk  23.4   11.0        Use the calculated Patient Ratio above and the CHD Risk Table to determine the patient's CHD Risk.  ATP III CLASSIFICATION (LDL):  <100     mg/dL   Optimal  100-129  mg/dL   Near or Above                    Optimal  130-159  mg/dL   Borderline  160-189  mg/dL   High  >190     mg/dL   Very High      Wt Readings from Last 3 Encounters:  07/29/20 250 lb (113.4 kg)  07/24/20 250 lb (113.4 kg)  07/17/20 258 lb 12.8 oz (117.4 kg)      Other studies Reviewed: Additional studies/ records that were reviewed today include: ***. Review of the above records demonstrates: ***  TTE 02/19/16  Review of the above records today demonstrates:  - Left ventricle: The cavity size was moderately dilated. Wall thickness was increased in a pattern of mild LVH. Systolic function was moderately to severely reduced. The estimated ejection fraction was in the range of 30% to 35%. Diffuse hypokinesis. Features are consistent with a pseudonormal left ventricular filling pattern, with concomitant abnormal relaxation and increased filling pressure (grade 2 diastolic dysfunction). - Aortic valve: There was no stenosis. There was mild regurgitation. - Aorta: Mildly dilated aortic root and ascending aorta. Aortic root dimension: 39 mm (ED). Ascending aortic diameter: 42 mm (S). - Mitral valve: There was mild regurgitation. - Left atrium: The atrium was moderately dilated. - Right ventricle: The cavity size was normal. Systolic function was normal. - Right atrium: The atrium was mildly dilated. - Pulmonary arteries: No complete TR doppler jet so unable to estimate PA systolic pressure. - Systemic veins: IVC not fully visualized. - Pericardium, extracardiac: A trivial pericardial effusion was identified posterior to the heart.    ASSESSMENT AND PLAN:  1.  ***   Current medicines are  reviewed at length with the patient today.  The patient {ACTIONS; HAS/DOES NOT HAVE:19233} concerns regarding medicines.  The following changes have been made:  {PLAN; NO CHANGE:13088:s}  Labs/ tests ordered today include: *** No orders of the defined types were placed in this encounter.    Disposition:   FU with *** in {gen number [id number]} {Days to years:10300}  Signed, Kathyrn Drown, NP  11/08/2020 [facility] [place], [place], [place]  [address] Phone: [phone]; Fax: [phone]

## 2020-11-11 ENCOUNTER — Ambulatory Visit: Payer: Medicare Other | Admitting: Cardiology

## 2020-12-02 ENCOUNTER — Other Ambulatory Visit: Payer: Self-pay | Admitting: Physician Assistant

## 2020-12-02 DIAGNOSIS — K5901 Slow transit constipation: Secondary | ICD-10-CM

## 2020-12-04 ENCOUNTER — Ambulatory Visit (INDEPENDENT_AMBULATORY_CARE_PROVIDER_SITE_OTHER): Payer: Medicare Other

## 2020-12-04 DIAGNOSIS — I255 Ischemic cardiomyopathy: Secondary | ICD-10-CM | POA: Diagnosis not present

## 2020-12-05 LAB — CUP PACEART REMOTE DEVICE CHECK
Battery Remaining Longevity: 26 mo
Battery Voltage: 2.94 V
Brady Statistic AP VP Percent: 17.7 %
Brady Statistic AP VS Percent: 0.21 %
Brady Statistic AS VP Percent: 78.71 %
Brady Statistic AS VS Percent: 3.38 %
Brady Statistic RA Percent Paced: 17.27 %
Brady Statistic RV Percent Paced: 82.31 %
Date Time Interrogation Session: 20220623001704
HighPow Impedance: 61 Ohm
Implantable Lead Implant Date: 20171212
Implantable Lead Implant Date: 20171212
Implantable Lead Implant Date: 20171212
Implantable Lead Location: 753858
Implantable Lead Location: 753859
Implantable Lead Location: 753860
Implantable Lead Model: 4298
Implantable Lead Model: 5076
Implantable Pulse Generator Implant Date: 20171212
Lead Channel Impedance Value: 118.56 Ohm
Lead Channel Impedance Value: 126.667
Lead Channel Impedance Value: 136.8 Ohm
Lead Channel Impedance Value: 143.419
Lead Channel Impedance Value: 155.455
Lead Channel Impedance Value: 228 Ohm
Lead Channel Impedance Value: 247 Ohm
Lead Channel Impedance Value: 285 Ohm
Lead Channel Impedance Value: 285 Ohm
Lead Channel Impedance Value: 342 Ohm
Lead Channel Impedance Value: 342 Ohm
Lead Channel Impedance Value: 399 Ohm
Lead Channel Impedance Value: 399 Ohm
Lead Channel Impedance Value: 399 Ohm
Lead Channel Impedance Value: 418 Ohm
Lead Channel Impedance Value: 475 Ohm
Lead Channel Impedance Value: 513 Ohm
Lead Channel Impedance Value: 513 Ohm
Lead Channel Pacing Threshold Amplitude: 0.5 V
Lead Channel Pacing Threshold Amplitude: 0.75 V
Lead Channel Pacing Threshold Amplitude: 0.875 V
Lead Channel Pacing Threshold Pulse Width: 0.4 ms
Lead Channel Pacing Threshold Pulse Width: 0.4 ms
Lead Channel Pacing Threshold Pulse Width: 0.4 ms
Lead Channel Sensing Intrinsic Amplitude: 12.875 mV
Lead Channel Sensing Intrinsic Amplitude: 12.875 mV
Lead Channel Sensing Intrinsic Amplitude: 3.5 mV
Lead Channel Sensing Intrinsic Amplitude: 3.5 mV
Lead Channel Setting Pacing Amplitude: 1.5 V
Lead Channel Setting Pacing Amplitude: 2 V
Lead Channel Setting Pacing Amplitude: 2.5 V
Lead Channel Setting Pacing Pulse Width: 0.4 ms
Lead Channel Setting Pacing Pulse Width: 0.4 ms
Lead Channel Setting Sensing Sensitivity: 0.3 mV

## 2020-12-08 ENCOUNTER — Other Ambulatory Visit: Payer: Self-pay | Admitting: Cardiology

## 2020-12-09 ENCOUNTER — Ambulatory Visit (INDEPENDENT_AMBULATORY_CARE_PROVIDER_SITE_OTHER): Payer: Medicare Other | Admitting: Family

## 2020-12-09 ENCOUNTER — Encounter: Payer: Self-pay | Admitting: Family

## 2020-12-09 ENCOUNTER — Other Ambulatory Visit: Payer: Self-pay

## 2020-12-09 ENCOUNTER — Telehealth: Payer: Self-pay | Admitting: Family

## 2020-12-09 VITALS — BP 80/50 | HR 72 | Ht 71.0 in | Wt 257.0 lb

## 2020-12-09 DIAGNOSIS — N186 End stage renal disease: Secondary | ICD-10-CM | POA: Diagnosis not present

## 2020-12-09 DIAGNOSIS — I255 Ischemic cardiomyopathy: Secondary | ICD-10-CM

## 2020-12-09 DIAGNOSIS — I10 Essential (primary) hypertension: Secondary | ICD-10-CM

## 2020-12-09 DIAGNOSIS — I5022 Chronic systolic (congestive) heart failure: Secondary | ICD-10-CM | POA: Diagnosis not present

## 2020-12-09 DIAGNOSIS — E782 Mixed hyperlipidemia: Secondary | ICD-10-CM

## 2020-12-09 DIAGNOSIS — I2510 Atherosclerotic heart disease of native coronary artery without angina pectoris: Secondary | ICD-10-CM

## 2020-12-09 DIAGNOSIS — G4733 Obstructive sleep apnea (adult) (pediatric): Secondary | ICD-10-CM

## 2020-12-09 NOTE — Telephone Encounter (Signed)
Pt has been made aware to not resume the Imdur or Hydralazine.

## 2020-12-09 NOTE — Telephone Encounter (Signed)
Attempted to call patient's wife back. Left message.  Medications have been removed from patient's chart.

## 2020-12-09 NOTE — Progress Notes (Signed)
Office Visit    Patient Name: Matthew Wood. Date of Encounter: 12/09/2020  PCP:  Seward Carol, [place]  Cardiologist:  Sinclair Grooms, MD  Advanced Practice Provider:  Tommie Raymond, NP Electrophysiologist:  Constance Haw, MD   Chief Complaint    Matthew Wood. is a 70 y.o. male with a hx of CAD, combined systolic and diastolic heart failure, ESRD on HD MWF, CVA, hypertension, lipidemia, follow-up antibiotic, CVA, OSA on CPAP, PPM, DM2 presents today for follow up of heart failure.    Past Medical History    Past Medical History:  Diagnosis Date   Acute respiratory failure ([place]) 10/04/2017   Anemia    Anemia in chronic kidney disease 09/29/2019   Arthritis of right knee 10/05/2017   CAD (coronary artery disease), native coronary artery 11/15/2013   70-80% mid RCA with marked ectasia.    Cardiorenal syndrome with renal failure 10/04/2017   Carotid aneurysm, left ([place]) 11/15/2013   CHF (congestive heart failure) ([facility])    CKD (chronic kidney disease), stage IV ([place]) 07/20/2016   CKD (chronic kidney disease), stage V ([place]) 03/27/2019   Coagulation defect, unspecified ([place]) 09/29/2019   Coronary artery disease    a. LHC 04/27/10 left main patent, 30-40% LAD, mid circumflex 30%, 80% mid RCA accounting for the appearance of an infract/peri-infract ischemia on nuclear testing, LV EF of 30-45%--> medical therapy   CVA (cerebrovascular accident) ([place]) 2011   potine; "little balance problems since" (05/25/2016)   Dyspnea    Elevated troponin 10/04/2017   End stage renal disease ([place]) 09/29/2019   Erectile dysfunction 11/15/2013   Essential hypertension 11/15/2013   Gout    Gout, unspecified 10/04/2019   Hematuria 10/05/2017   History of kidney stones    "passed them" (05/25/2016)   Hyperlipidemia    Hypertension    Iron deficiency anemia, unspecified 09/29/2019   LBBB (left bundle branch block)    Left pontine CVA ([place]) 11/15/2013    Multinodular goiter    Obesity    OSA (obstructive sleep apnea) 11/15/2013   OSA on CPAP    PONV (postoperative nausea and vomiting)    diarrhea   Presence of permanent cardiac pacemaker    Renal insufficiency    Right upper lobe pneumonia 10/04/2017   Secondary hyperparathyroidism of renal origin ([place]) 09/29/2019   Type 2 diabetes mellitus with nephropathy ([place]) 10/05/2017   Type II diabetes mellitus ([place])    Unspecified sequelae of cerebral infarction 10/04/2019   Wears partial dentures    Past Surgical History:  Procedure Laterality Date   A/V FISTULAGRAM Right 01/31/2020   Procedure: A/V FISTULAGRAM;  Surgeon: Marty Heck, MD;  Location: [facility];  Service: Cardiovascular;  Laterality: Right;   AV FISTULA PLACEMENT Right 05/25/2019   Procedure: ARTERIOVENOUS (AV) FISTULA CREATION RIGHT ARM;  Surgeon: Marty Heck, MD;  Location: [facility];  Service: Vascular;  Laterality: Right;   COLONOSCOPY WITH PROPOFOL N/A 04/16/2014   Procedure: COLONOSCOPY WITH PROPOFOL;  Surgeon: [name], MD;  [facility];  Service: Endoscopy;  Laterality: N/A;   EP IMPLANTABLE DEVICE N/A 05/25/2016   Procedure: BiV ICD Insertion CRT-D;  Surgeon: [name], MD;  Location: [facility];  Service: Cardiovascular;  Laterality: N/A;   INSERT / REPLACE / REMOVE PACEMAKER  05/25/2016   biventricular pacemaker   LAPAROSCOPIC CHOLECYSTECTOMY     PERIPHERAL VASCULAR INTERVENTION N/A  07/24/2020   Procedure: PERIPHERAL VASCULAR INTERVENTION;  Surgeon: Marty Heck, MD;  Location: [facility];  Service: Cardiovascular;  Laterality: N/A;  central fistula stent   REVISON OF ARTERIOVENOUS FISTULA Right 02/14/2020   Procedure: RIGHT ARM ARTERIOVENOUS FISTULA REVISON WITH SIDE BRANCH LIGATION VERSUS CONVERSION TO UPPER ARM FISTULA;  Surgeon: Marty Heck, MD;  Location: [facility];  Service: Vascular;  Laterality: Right;   UPPER EXTREMITY VENOGRAPHY N/A  07/24/2020   Procedure: CENTRAL VENOGRAPHY;  Surgeon: Marty Heck, MD;  Location: [facility];  Service: Cardiovascular;  Laterality: N/A;    Allergies  Allergies  Allergen Reactions   Cinacalcet Hcl Nausea And Vomiting    (SENSIPAR)    History of Present Illness    [name]. is a 70 y.o. male with a hx of CAD, combined systolic and diastolic heart failure, ESRD on HD MWF, CVA, hypertension, lipidemia, follow-up antibiotic, CVA, OSA on CPAP, PPM, DM2. He was last seen 07/29/20 by Kathyrn Drown, NP via virtual visit.  Cardiac catheterization in 2011 showing significant coronary disease with recommendation for medical management.  Medtronic CRT-D implanted 06/03/2016.  Most recent echocardiogram 08/2019 with LVEF 25 to 37%, grade 1 diastolic dysfunction, severe LVE, moderate LVH, mildly thickened aortic root, mild AI, moderate LAE, mild RVE, small pericardial effusion.  He was seen 07/17/2020 by Dr. Curt Bears and Imdur and hydralazine were added for optimization of heart failure therapy.  He was seen 07/29/20 via virtual visit.  He noted mild dyspnea which she attributed to deconditioning.  It was unchanged over the previous several months.  He was offered echocardiogram versus stress testing which he politely declined with preference to monitor symptoms.  On 07/24/20 he underwent venography with Dr. Carlis Abbott of vascular surgery with angioplasty of right subclavian vein and angioplasty/stent of right innominate vein.   He presents today for follow-up with his wife. Notes hypotension at HD for which he has been taking midodrine prior to HD.  This has been ongoing for about a month.  His wife manages his medications and is uncertain whether he is taking hydralazine or Imdur but agreeable to contact our office and make [place] aware.  Reports no lower extremity edema.  Denies orthopnea, PND.  Weight overall stable since last seen in clinic 07/2020.  Endorses continued dyspnea on exertion  with not very much activity.  Tells me this is overall worsening.  Denies chest pain, pressure, tightness.  Denies palpitations.  EKGs/Labs/Other Studies Reviewed:   The following studies were reviewed today:   EKG:  No EKG today.  Recent Labs: 07/24/2020: BUN 38; Creatinine, Ser 6.70; Hemoglobin 12.2; Potassium 3.5; Sodium 138  Recent Lipid Panel    Component Value Date/Time   CHOL  02/08/2010 0600    171        ATP III CLASSIFICATION:  <200     mg/dL   Desirable  200-239  mg/dL   Borderline High  >=240    mg/dL   High          TRIG 248 (H) 02/08/2010 0600   HDL 34 (L) 02/08/2010 0600   CHOLHDL 5.0 02/08/2010 0600   VLDL 50 (H) 02/08/2010 0600   LDLCALC  02/08/2010 0600    87        Total Cholesterol/HDL:CHD Risk Coronary Heart Disease Risk Table                     Men   Women  1/2  Average Risk   3.4   3.3  Average Risk       5.0   4.4  2 X Average Risk   9.6   7.1  3 X Average Risk  23.4   11.0        Use the calculated Patient Ratio above and the CHD Risk Table to determine the patient's CHD Risk.        ATP III CLASSIFICATION (LDL):  <100     mg/dL   Optimal  100-129  mg/dL   Near or Above                    Optimal  130-159  mg/dL   Borderline  160-189  mg/dL   High  >190     mg/dL   Very High    Home Medications   Current Meds  Medication Sig   allopurinol (ZYLOPRIM) 300 MG tablet Take 300 mg by mouth daily.   aspirin EC 81 MG tablet Take 81 mg by mouth at bedtime.   b complex-vitamin c-folic acid (NEPHRO-VITE) 0.8 MG TABS tablet Take 1 tablet by mouth daily.    carvedilol (COREG) 25 MG tablet Take 25 mg by mouth See admin instructions. TAKE 1 TABLET (25 MG) TUESDAYS, THURSDAYS, SATURDAYS & SUNDAYS BY MOUTH TWICE DAILY TAKE 1 TABLET (25 MG) MONDAYS, WEDNESDAYS, & FRIDAYS BY MOUTH IN THE EVENING ONLY   HUMALOG KWIKPEN 100 UNIT/ML KiwkPen Inject 15 Units into the skin daily after breakfast.    hydrALAZINE (APRESOLINE) 25 MG tablet Take 1 tablet (25 mg  total) by mouth in the morning and at bedtime.   insulin glargine (LANTUS) 100 UNIT/ML injection Inject 28 Units into the skin at bedtime.    isosorbide mononitrate (IMDUR) 30 MG 24 hr tablet Take 1 tablet (30 mg total) by mouth daily.   lidocaine-prilocaine (EMLA) cream Apply 1 application topically as needed (prior to port being accessed).   NON FORMULARY CPAP: At bedtime   rosuvastatin (CRESTOR) 20 MG tablet Take 20 mg by mouth at bedtime.   sevelamer carbonate (RENVELA) 800 MG tablet Take 800-2,400 mg by mouth See admin instructions. TAKE 2 TABLETS (1600 MG) BY MOUTH WITH EACH MEAL & TAKE 1 TABLET (800 MG) BY MOUTH WITH A SNACK.   Current Facility-Administered Medications for the 12/09/20 encounter (Appointment) with Loel Dubonnet, NP  Medication   0.9 %  sodium chloride infusion     Review of Systems   All other systems reviewed and are otherwise negative except as noted above.  Physical Exam    VS:  There were no vitals taken for this visit. , BMI There is no height or weight on file to calculate BMI.  Wt Readings from Last 3 Encounters:  07/29/20 250 lb (113.4 kg)  07/24/20 250 lb (113.4 kg)  07/17/20 258 lb 12.8 oz (117.4 kg)     GEN: Well nourished, well developed, in no acute distress. HEENT: normal. Neck: Supple, no JVD, carotid bruits, or masses. Cardiac: RRR, no murmurs, rubs, or gallops. No clubbing, cyanosis, edema.  Radials/PT 2+ and equal bilaterally.  Respiratory:  Respirations regular and unlabored, clear to auscultation bilaterally. GI: Soft, nontender, nondistended. MS: No deformity or atrophy. Skin: Warm and dry, no rash. Neuro:  Strength and sensation are intact. Psych: Normal affect.  Assessment & Plan    Chronic systolic and diastolic heart failure - Volume management per HD. Previously on Hydralazine and Imdur for optimization of HF therapy but did not  tolerate due to hypotension. Wife confirms in phone call after visit that he is no longer taking.  Given worsening dyspnea and hypotension, update echocardiogram. Recent labs with HD unfortunately unavailable for review. Heart healthy diet and regular cardiovascular exercise encouraged. Deconditioning also likely contributory to dyspnea, orthopedic injuries limit exercise, seated exercise handout provided.   CAD - No chest pain. GDMT includes aspirin, Coreg, Rosuvastatin. Heart healthy diet and regular cardiovascular exercise encouraged.   HTN - Now with hypotension. Midodrine prior to HD per nephrology. If persists, consider reduced dose Coreg. No lightheadedness, dizziness, near syncope, syncope.   S/p CRT-D - Follow with EP.   HLD - Continue Rosuvatatin 20mg  daily.  OSA - Reports only using CPAP a few hours per night.  Reiterated the importance of regular CPAP usage. CPAP compliance encouraged.  S/p CRT-P - Continue to follow with EP.   Disposition: Follow up in 2 month(s) with Dr. Tamala Julian or APP   Signed, Loel Dubonnet, NP 12/09/2020, 10:41 AM [place]

## 2020-12-09 NOTE — Telephone Encounter (Signed)
Pt c/o medication issue:  1. Name of Medication:   hydrALAZINE (APRESOLINE) 25 MG tablet  isosorbide mononitrate (IMDUR) 30 MG 24 hr tablet  2. How are you currently taking this medication (dosage and times per day)? Not currently taking   3. Are you having a reaction (difficulty breathing--STAT)? No   4. What is your medication issue? [place] is no longer taking these meds. Caitlin requested they call in to notify our office so they can be removed from the chart. Please advise.

## 2020-12-09 NOTE — Patient Instructions (Addendum)
Medication Instructions:   Check your pill bottles at home.   Please call us to let us know if you are taking: Isosorbide Mononitrate (Imdur) Hydralazine (Apresoline)  While these medications help strengthen your heart - they also can lower your blood.   *If you need a refill on your cardiac medications before your next appointment, please call your pharmacy*  Lab Work: None ordered today.   Testing/Procedures:  Your physician has requested that you have an echocardiogram. Echocardiography is a painless test that uses sound waves to create images of your heart. It provides your doctor with information about the size and shape of your heart and how well your heart's chambers and valves are working. This procedure takes approximately one hour. There are no restrictions for this procedure.  Follow-Up: At [facility], you and your health needs are our priority.  As part of our continuing mission to provide you with exceptional heart care, we have created designated Provider Care Teams.  These Care Teams include your primary Cardiologist (physician) and Advanced Practice Providers (APPs -  Physician Assistants and Nurse Practitioners) who all work together to provide you with the care you need, when you need it.  We recommend signing up for the patient portal called "MyChart".  Sign up information is provided on this After Visit Summary.  MyChart is used to connect with patients for Virtual Visits (Telemedicine).  Patients are able to view lab/test results, encounter notes, upcoming appointments, etc.  Non-urgent messages can be sent to your provider as well.   To learn more about what you can do with MyChart, go to [email or web address].    Your next appointment:   2 month(s)  The format for your next appointment:   In Person  Provider:   You may see Sinclair Grooms, MD or one of the following Advanced Practice Providers on your designated Care Team:   Kathyrn Drown, NP  Other  Instructions  Heart Healthy Diet Recommendations: A low-salt diet is recommended. Meats should be grilled, baked, or boiled. Avoid fried foods. Focus on lean protein sources like fish or chicken with vegetables and fruits. The American Heart Association is a Microbiologist!  American Heart Association Diet and Lifeystyle Recommendations   Exercise recommendations: The American Heart Association recommends 150 minutes of moderate intensity exercise weekly. Try 30 minutes of moderate intensity exercise 4-5 times per week. This could include walking, jogging, or swimming. Exercises to do While Sitting  Exercises that you do while sitting (chair exercises) can give you many of the same benefits as full exercise. Benefits include strengthening your heart, burning calories, and keeping muscles and joints healthy. Exercise can also improve your mood and help with depression andanxiety. You may benefit from chair exercises if you are unable to do standing exercises because of: Diabetic foot pain. Obesity. Illness. Arthritis. Recovery from surgery or injury. Breathing problems. Balance problems. Another type of disability. Before starting chair exercises, check with your health care provider or a physical therapist to find out how much exercise you can tolerate and which exercises are safe for you. If your health care provider approves: Start out slowly and build up over time. Aim to work up to about 10-20 minutes for each exercise session. Make exercise part of your daily routine. Drink water when you exercise. Do not wait until you are thirsty. Drink every 10-15 minutes. Stop exercising right away if you have pain, nausea, shortness of breath, or dizziness. If you are exercising in a wheelchair, make  sure to lock the wheels. Ask your health care provider whether you can do tai chi or yoga. Many positions in these mind-body exercises can be modified to do while seated. Warm-up Before starting  other exercises: Sit up as straight as you can. Have your knees bent at 90 degrees, which is the shape of the capital letter "L." Keep your feet flat on the floor. Sit at the front edge of your chair, if you can. Pull in (tighten) the muscles in your abdomen and stretch your spine and neck as straight as you can. Hold this position for a few minutes. Breathe in and out evenly. Try to concentrate on your breathing, and relax your mind. Stretching Exercise A: Arm stretch Hold your arms out straight in front of your body. Bend your hands at the wrist with your fingers pointing up, as if signaling someone to stop. Notice the slight tension in your forearms as you hold the position. Keeping your arms out and your hands bent, rotate your hands outward as far as you can and hold this stretch. Aim to have your thumbs pointing up and your pinkie fingers pointing down. Slowly repeat arm stretches for one minute as tolerated. Exercise B: Leg stretch If you can move your legs, try to "draw" letters on the floor with the toes of your foot. Write your name with one foot. Write your name with the toes of your other foot. Slowly repeat the movements for one minute as tolerated. Exercise C: Reach for the sky Reach your hands as far over your head as you can to stretch your spine. Move your hands and arms as if you are climbing a rope. Slowly repeat the movements for one minute as tolerated. Range of motion exercises Exercise A: Shoulder roll Let your arms hang loosely at your sides. Lift just your shoulders up toward your ears, then let them relax back down. When your shoulders feel loose, rotate your shoulders in backward and forward circles. Do shoulder rolls slowly for one minute as tolerated. Exercise B: March in place As if you are marching, pump your arms and lift your legs up and down. Lift your knees as high as you can. If you are unable to lift your knees, just pump your arms and move your ankles  and feet up and down. March in place for one minute as tolerated. Exercise C: Seated jumping jacks Let your arms hang down straight. Keeping your arms straight, lift them up over your head. Aim to point your fingers to the ceiling. While you lift your arms, straighten your legs and slide your heels along the floor to your sides, as wide as you can. As you bring your arms back down to your sides, slide your legs back together. If you are unable to use your legs, just move your arms. Slowly repeat seated jumping jacks for one minute as tolerated. Strengthening exercises Exercise A: Shoulder squeeze Hold your arms straight out from your body to your sides, with your elbows bent and your fists pointed at the ceiling. Keeping your arms in the bent position, move them forward so your elbows and forearms meet in front of your face. Open your arms back out as wide as you can with your elbows still bent, until you feel your shoulder blades squeezing together. Hold for 5 seconds. Slowly repeat the movements forward and backward for one minute as tolerated. Contact a health care provider if you: Had to stop exercising due to any of the following:  Pain. Nausea. Shortness of breath. Dizziness. Fatigue. Have significant pain or soreness after exercising. Get help right away if you have: Chest pain. Difficulty breathing. These symptoms may represent a serious problem that is an emergency. Do not wait to see if the symptoms will go away. Get medical help right away. Call your local emergency services (911 in the [place] Do not drive yourself to the hospital. This information is not intended to replace advice given to you by your health care provider. Make sure you discuss any questions you have with your healthcare provider. Document Revised: 09/10/2019 Document Reviewed: 09/27/2019 Elsevier Patient Education  2022 [employer].

## 2020-12-16 ENCOUNTER — Ambulatory Visit
Admission: RE | Admit: 2020-12-16 | Discharge: 2020-12-16 | Disposition: A | Discharge status: Home / Self Care | Payer: Medicare Other | Source: Ambulatory Visit | Attending: Physician Assistant | Admitting: Physician Assistant

## 2020-12-16 ENCOUNTER — Other Ambulatory Visit: Payer: Self-pay

## 2020-12-16 DIAGNOSIS — K5901 Slow transit constipation: Secondary | ICD-10-CM

## 2020-12-24 NOTE — Progress Notes (Signed)
Remote ICD transmission.   

## 2021-01-05 ENCOUNTER — Other Ambulatory Visit: Payer: Self-pay | Admitting: Internal Medicine

## 2021-01-05 ENCOUNTER — Other Ambulatory Visit: Payer: Self-pay | Admitting: Physician Assistant

## 2021-01-05 DIAGNOSIS — K8689 Other specified diseases of pancreas: Secondary | ICD-10-CM

## 2021-01-06 ENCOUNTER — Ambulatory Visit (HOSPITAL_COMMUNITY): Payer: Medicare Other | Attending: Internal Medicine

## 2021-01-06 ENCOUNTER — Other Ambulatory Visit: Payer: Self-pay

## 2021-01-06 DIAGNOSIS — I5022 Chronic systolic (congestive) heart failure: Secondary | ICD-10-CM | POA: Insufficient documentation

## 2021-01-06 DIAGNOSIS — I255 Ischemic cardiomyopathy: Secondary | ICD-10-CM | POA: Insufficient documentation

## 2021-01-06 LAB — ECHOCARDIOGRAM COMPLETE
Area-P 1/2: 2.53 cm2
S' Lateral: 5.3 cm

## 2021-01-06 MED ORDER — PERFLUTREN LIPID MICROSPHERE
1.0000 mL | INTRAVENOUS | Status: AC | PRN
  Administered 2021-01-06: 3 mL via INTRAVENOUS

## 2021-01-19 NOTE — Progress Notes (Signed)
Cardiology Office Note:    Date:  01/20/2021   ID:  Matthew Filbert., DOB May 18, 1951, MRN [id number]  PCP:  Seward Carol, MD  Cardiologist:  Sinclair Grooms, MD   Referring MD: Seward Carol, MD   Chief Complaint  Patient presents with   Congestive Heart Failure   Follow-up    ESRD     History of Present Illness:    Matthew Caravello. is a 70 y.o. male with a hx of CAD, HTN, HLD, LBBB, multinodular goiter, OSA on CPAP, DM, CVA and CKD stage IV, chronic combined systolic and diastolic heart failure with EF 30-35% range in the setting of ischemic cardiomyopathy and CRT-D implanted 05/2016.     He is not on dialysis for over a year.  Lower extremity edema has completely resolved.  Heart failure therapy has been significantly altered due to blood pressure concerns.  His only remaining guideline directed therapy is carvedilol 25 mg twice daily.  He takes midodrine 10 to 20 mg prior to dialysis on Monday Wednesday and Friday to maintain blood pressure.  He had cardiac evaluation at [facility] for consideration of transplantation and was turned down because of being too high risk.  He has difficulty with ambulation related to bilateral severe osteoarthritis.  This has therefore led to significant physical deconditioning.  He has been advised against knee replacement surgery because of his complement of medical problems.  He denies syncope, palpitations, chest pain, orthopnea, and PND.  Overall, dyspnea on exertion is improved.  Past Medical History:  Diagnosis Date   Acute respiratory failure ([place]) 10/04/2017   Anemia    Anemia in chronic kidney disease 09/29/2019   Arthritis of right knee 10/05/2017   CAD (coronary artery disease), native coronary artery 11/15/2013   70-80% mid RCA with marked ectasia.    Cardiorenal syndrome with renal failure 10/04/2017   Carotid aneurysm, left ([place]) 11/15/2013   CHF (congestive heart failure) (HCC)    CKD (chronic kidney disease), stage IV  ([place]) 07/20/2016   CKD (chronic kidney disease), stage V ([place]) 03/27/2019   Coagulation defect, unspecified ([place]) 09/29/2019   Coronary artery disease    a. LHC 04/27/10 left main patent, 30-40% LAD, mid circumflex 30%, 80% mid RCA accounting for the appearance of an infract/peri-infract ischemia on nuclear testing, LV EF of 30-45%--> medical therapy   CVA (cerebrovascular accident) ([place]) 2011   potine; "little balance problems since" (05/25/2016)   Dyspnea    Elevated troponin 10/04/2017   End stage renal disease ([place]) 09/29/2019   Erectile dysfunction 11/15/2013   Essential hypertension 11/15/2013   Gout    Gout, unspecified 10/04/2019   Hematuria 10/05/2017   History of kidney stones    "passed them" (05/25/2016)   Hyperlipidemia    Hypertension    Iron deficiency anemia, unspecified 09/29/2019   LBBB (left bundle branch block)    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   Multinodular goiter    Obesity    OSA (obstructive sleep apnea) 11/15/2013   OSA on CPAP    PONV (postoperative nausea and vomiting)    diarrhea   Presence of permanent cardiac pacemaker    Renal insufficiency    Right upper lobe pneumonia 10/04/2017   Secondary hyperparathyroidism of renal origin ([place]) 09/29/2019   Type 2 diabetes mellitus with nephropathy ([place]) 10/05/2017   Type II diabetes mellitus ([place])    Unspecified sequelae of cerebral infarction 10/04/2019   Wears partial dentures     Past  Surgical History:  Procedure Laterality Date   A/V FISTULAGRAM Right 01/31/2020   Procedure: A/V FISTULAGRAM;  Surgeon: Marty Heck, MD;  Location: [facility];  Service: Cardiovascular;  Laterality: Right;   AV FISTULA PLACEMENT Right 05/25/2019   Procedure: ARTERIOVENOUS (AV) FISTULA CREATION RIGHT ARM;  Surgeon: Marty Heck, MD;  Location: [facility];  Service: Vascular;  Laterality: Right;   COLONOSCOPY WITH PROPOFOL N/A 04/16/2014   Procedure: COLONOSCOPY WITH PROPOFOL;  Surgeon: [name], MD;   Location: [facility];  Service: Endoscopy;  Laterality: N/A;   EP IMPLANTABLE DEVICE N/A 05/25/2016   Procedure: BiV ICD Insertion CRT-D;  Surgeon: [name], MD;  Location: [facility];  Service: Cardiovascular;  Laterality: N/A;   INSERT / REPLACE / REMOVE PACEMAKER  05/25/2016   biventricular pacemaker   LAPAROSCOPIC CHOLECYSTECTOMY     PERIPHERAL VASCULAR INTERVENTION N/A 07/24/2020   Procedure: PERIPHERAL VASCULAR INTERVENTION;  Surgeon: Marty Heck, MD;  Location: [facility];  Service: Cardiovascular;  Laterality: N/A;  central fistula stent   REVISON OF ARTERIOVENOUS FISTULA Right 02/14/2020   Procedure: RIGHT ARM ARTERIOVENOUS FISTULA REVISON WITH SIDE BRANCH LIGATION VERSUS CONVERSION TO UPPER ARM FISTULA;  Surgeon: Marty Heck, MD;  Location: [facility];  Service: Vascular;  Laterality: Right;   UPPER EXTREMITY VENOGRAPHY N/A 07/24/2020   Procedure: CENTRAL VENOGRAPHY;  Surgeon: Marty Heck, MD;  Location: [facility];  Service: Cardiovascular;  Laterality: N/A;    Current Medications: No outpatient medications have been marked as taking for the 01/20/21 encounter (Office Visit) with Belva Crome, MD.   Current Facility-Administered Medications for the 01/20/21 encounter (Office Visit) with Belva Crome, MD  Medication   0.9 %  sodium chloride infusion     Allergies:   Cinacalcet hcl   Social History   Socioeconomic History   Marital status: Married    Spouse name: Not on file   Number of children: 1   Years of education: Not on file   Highest education level: Not on file  Occupational History   Occupation: auto Diplomatic Services operational officer: [employer]  Tobacco Use   Smoking status: Former    Packs/day: 0.50    Years: 27.00    Pack years: 13.50    Types: Cigarettes    Quit date: 03/25/2010    Years since quitting: 10.8   Smokeless tobacco: Never  Vaping Use   Vaping Use: Never used  Substance and Sexual Activity    Alcohol use: Yes    Comment: rare   Drug use: No   Sexual activity: Not on file  Other Topics Concern   Not on file  Social History Narrative   Not on file   Social Determinants of Health   Financial Resource Strain: Not on file  Food Insecurity: Not on file  Transportation Needs: Not on file  Physical Activity: Not on file  Stress: Not on file  Social Connections: Not on file     Family History: The patient's family history includes Breast cancer in his mother; Diabetes type II in his father; Heart attack in his father; Hypertension in his mother and sister; Stroke in his father.  ROS:   Please see the history of present illness.    Severe osteoarthritis with difficulty ambulating.  3 times per week all other systems reviewed and are negative.  EKGs/Labs/Other Studies Reviewed:    The following studies were reviewed today:  2D Doppler echocardiogram 01/06/2021: IMPRESSIONS     1. Technically difficult study, despite Definity contrast, poor windows   2. Left ventricular ejection fraction, by estimation, is 30 to 35%. The  left ventricle has moderately decreased function. The left ventricle  demonstrates regional wall motion abnormalities (see scoring  diagram/findings for description). The left  ventricular internal cavity size was moderately to severely dilated. There  is mild left ventricular hypertrophy. Left ventricular diastolic  parameters are consistent with Grade I diastolic dysfunction (impaired  relaxation). There is severe akinesis of  the left ventricular, entire inferior wall and inferoseptal wall.   3. Right ventricular systolic function is normal. The right ventricular  size is normal.   4. Left atrial size was mildly dilated.   5. The mitral valve was not well visualized. No evidence of mitral valve  regurgitation.   6. The aortic valve is tricuspid. Aortic valve regurgitation is mild. No  aortic stenosis is present.   7. Aortic dilatation noted.  There is mild dilatation of the aortic root,  measuring 43 mm. There is mild dilatation of the ascending aorta,  measuring 42 mm.   8. The inferior vena cava is normal in size with greater than 50%  respiratory variability, suggesting right atrial pressure of 3 mmHg.   Comparison(s): Changes from prior study are noted. 09/10/2019: LVEF 25-30%.   EKG:  EKG not performed  Recent Labs: 07/24/2020: BUN 38; Creatinine, Ser 6.70; Hemoglobin 12.2; Potassium 3.5; Sodium 138  Recent Lipid Panel    Component Value Date/Time   CHOL  02/08/2010 0600    171        ATP III CLASSIFICATION:  <200     mg/dL   Desirable  200-239  mg/dL   Borderline High  >=240    mg/dL   High          TRIG 248 (H) 02/08/2010 0600   HDL 34 (L) 02/08/2010 0600   CHOLHDL 5.0 02/08/2010 0600   VLDL 50 (H) 02/08/2010 0600   LDLCALC  02/08/2010 0600    87        Total Cholesterol/HDL:CHD Risk Coronary Heart Disease Risk Table                     Men   Women  1/2 Average Risk   3.4   3.3  Average Risk       5.0   4.4  2 X Average Risk   9.6   7.1  3 X Average Risk  23.4   11.0        Use the calculated Patient Ratio above and the CHD Risk Table to determine the patient's CHD Risk.        ATP III CLASSIFICATION (LDL):  <100     mg/dL   Optimal  100-129  mg/dL   Near or Above                    Optimal  130-159  mg/dL   Borderline  160-189  mg/dL   High  >190     mg/dL   Very High    Physical Exam:    VS:  Ht 5\' 11"  (1.803 m)   BMI 35.84 kg/m     Wt Readings from Last 3 Encounters:  12/09/20 257 lb (116.6 kg)  07/29/20 250 lb (113.4 kg)  07/24/20 250 lb (113.4 kg)     GEN: Obese but weight is being maintained around 250 pounds.[place] Kitchen No  acute distress HEENT: Normal NECK: No JVD. LYMPHATICS: No lymphadenopathy CARDIAC: No murmur. RRR no gallop, or edema. VASCULAR:  Normal Pulses. No bruits. RESPIRATORY:  Clear to auscultation without rales, wheezing or rhonchi  ABDOMEN: Soft, non-tender,  non-distended, No pulsatile mass, MUSCULOSKELETAL: No deformity  SKIN: Warm and dry NEUROLOGIC:  Alert and oriented x 3 PSYCHIATRIC:  Normal affect   ASSESSMENT:    1. Chronic systolic heart failure ([place])   2. ESRD (end stage renal disease) ([place])   3. Coronary artery disease involving native coronary artery of native heart without angina pectoris   4. Essential hypertension   5. OSA (obstructive sleep apnea)   6. Type 2 diabetes mellitus with nephropathy ([place])   7. AICD (automatic cardioverter/defibrillator) present   8. Mixed hyperlipidemia    PLAN:    In order of problems listed above:  He is currently unable to use guideline directed therapy for systolic dysfunction due to hypotension related to dialysis.  He is still on carvedilol 25 mg twice daily on nondialysis days and 25 mg after dialysis on Monday Wednesday and Friday.  To help better support the blood pressure, will decrease carvedilol dosing to 12.5 mg twice daily on nondialysis days and after dialysis on Monday Wednesday and Friday.  This will help support the blood pressure and may lead to less use of midodrine.  If we need to further decrease carvedilol dose, we could go to 6.25 mg dosing however I would not drop it below that because we lose the cardiovascular protective effect at 3.25 mg dosing. Continue dialysis.  Using midodrine when necessary. No symptoms of angina. Blood pressure today is 90/60 mmHg.  Carvedilol will be decreased to 12.5 mg dosing and could be further reduced to 6.25 mg dosing if needed. He has sleep apnea but will not wear the mask/CPAP and therefore there is continual stress on his heart related to untreated sleep apnea. No discussion concerning management of diabetes mellitus.  I would love for him to be on SGLT2 therapy but these agents are contraindicated in end-stage kidney disease. Continue to follow in the device clinic Continue aspirin and Crestor therapy.  Target LDL less than 70.  Most  recent LDL in May was 53.      Hopefully reducing the dose of carvedilol will help support blood pressure and allow him to feel somewhat stronger when he tries to ambulate.   Medication Adjustments/Labs and Tests Ordered: Current medicines are reviewed at length with the patient today.  Concerns regarding medicines are outlined above.  No orders of the defined types were placed in this encounter.  No orders of the defined types were placed in this encounter.   There are no Patient Instructions on file for this visit.   Signed, Sinclair Grooms, MD  01/20/2021 8:14 AM    [place]

## 2021-01-20 ENCOUNTER — Encounter: Payer: Self-pay | Admitting: Interventional Cardiology

## 2021-01-20 ENCOUNTER — Ambulatory Visit (INDEPENDENT_AMBULATORY_CARE_PROVIDER_SITE_OTHER): Payer: Medicare Other | Admitting: Interventional Cardiology

## 2021-01-20 ENCOUNTER — Other Ambulatory Visit: Payer: Self-pay

## 2021-01-20 VITALS — BP 90/60 | HR 78 | Ht 71.0 in | Wt 253.8 lb

## 2021-01-20 DIAGNOSIS — I5022 Chronic systolic (congestive) heart failure: Secondary | ICD-10-CM | POA: Diagnosis not present

## 2021-01-20 DIAGNOSIS — E782 Mixed hyperlipidemia: Secondary | ICD-10-CM

## 2021-01-20 DIAGNOSIS — G4733 Obstructive sleep apnea (adult) (pediatric): Secondary | ICD-10-CM

## 2021-01-20 DIAGNOSIS — I10 Essential (primary) hypertension: Secondary | ICD-10-CM

## 2021-01-20 DIAGNOSIS — I2510 Atherosclerotic heart disease of native coronary artery without angina pectoris: Secondary | ICD-10-CM

## 2021-01-20 DIAGNOSIS — N186 End stage renal disease: Secondary | ICD-10-CM

## 2021-01-20 DIAGNOSIS — E1121 Type 2 diabetes mellitus with diabetic nephropathy: Secondary | ICD-10-CM

## 2021-01-20 DIAGNOSIS — Z95810 Presence of automatic (implantable) cardiac defibrillator: Secondary | ICD-10-CM

## 2021-01-20 MED ORDER — CARVEDILOL 12.5 MG PO TABS: ORAL_TABLET | ORAL | 3 refills | Status: DC

## 2021-01-20 NOTE — Patient Instructions (Signed)
Medication Instructions:  1) DECREASE Carvedilol to 12.5mg  twice daily on non-dialysis days (Tuesday, Thursday, Saturday and Sunday).  Take Carvedilol 12.5mg  in the evening only on Monday, Wednesday and Friday (dialysis days).  You may use up your 25mg  tablets.  Just take a half tablet with each dose until you use them up.  *If you need a refill on your cardiac medications before your next appointment, please call your pharmacy*   Lab Work: None If you have labs (blood work) drawn today and your tests are completely normal, you will receive your results only by: [place] (if you have MyChart) OR A paper copy in the mail If you have any lab test that is abnormal or we need to change your treatment, we will call you to review the results.   Testing/Procedures: None   Follow-Up: At [facility], you and your health needs are our priority.  As part of our continuing mission to provide you with exceptional heart care, we have created designated Provider Care Teams.  These Care Teams include your primary Cardiologist (physician) and Advanced Practice Providers (APPs -  Physician Assistants and Nurse Practitioners) who all work together to provide you with the care you need, when you need it.  We recommend signing up for the patient portal called "MyChart".  Sign up information is provided on this After Visit Summary.  MyChart is used to connect with patients for Virtual Visits (Telemedicine).  Patients are able to view lab/test results, encounter notes, upcoming appointments, etc.  Non-urgent messages can be sent to your provider as well.   To learn more about what you can do with MyChart, go to [email or web address].    Your next appointment:   6 month(s)  The format for your next appointment:   In Person  Provider:   You may see Sinclair Grooms, MD or one of the following Advanced Practice Providers on your designated Care Team:   Cecilie Kicks, NP   Other Instructions

## 2021-01-22 ENCOUNTER — Ambulatory Visit
Admission: RE | Admit: 2021-01-22 | Discharge: 2021-01-22 | Disposition: A | Discharge status: Home / Self Care | Payer: Medicare Other | Source: Ambulatory Visit | Attending: Physician Assistant | Admitting: Physician Assistant

## 2021-01-22 ENCOUNTER — Other Ambulatory Visit: Payer: Self-pay

## 2021-01-22 DIAGNOSIS — K8689 Other specified diseases of pancreas: Secondary | ICD-10-CM

## 2021-01-22 MED ORDER — IOPAMIDOL (ISOVUE-300) INJECTION 61%
100.0000 mL | Freq: Once | INTRAVENOUS | Status: AC | PRN
  Administered 2021-01-22: 100 mL via INTRAVENOUS

## 2021-03-05 ENCOUNTER — Ambulatory Visit (INDEPENDENT_AMBULATORY_CARE_PROVIDER_SITE_OTHER): Payer: Medicare Other

## 2021-03-05 DIAGNOSIS — I255 Ischemic cardiomyopathy: Secondary | ICD-10-CM | POA: Diagnosis not present

## 2021-03-07 LAB — CUP PACEART REMOTE DEVICE CHECK
Battery Remaining Longevity: 25 mo
Battery Voltage: 2.93 V
Brady Statistic AP VP Percent: 16.18 %
Brady Statistic AP VS Percent: 0.15 %
Brady Statistic AS VP Percent: 80.9 %
Brady Statistic AS VS Percent: 2.78 %
Brady Statistic RA Percent Paced: 15.97 %
Brady Statistic RV Percent Paced: 87.86 %
Date Time Interrogation Session: 20220922063527
HighPow Impedance: 62 Ohm
Implantable Lead Implant Date: 20171212
Implantable Lead Implant Date: 20171212
Implantable Lead Implant Date: 20171212
Implantable Lead Location: 753858
Implantable Lead Location: 753859
Implantable Lead Location: 753860
Implantable Lead Model: 4298
Implantable Lead Model: 5076
Implantable Pulse Generator Implant Date: 20171212
Lead Channel Impedance Value: 132.321
Lead Channel Impedance Value: 136.276
Lead Channel Impedance Value: 143.419
Lead Channel Impedance Value: 155.455
Lead Channel Impedance Value: 160.941
Lead Channel Impedance Value: 247 Ohm
Lead Channel Impedance Value: 285 Ohm
Lead Channel Impedance Value: 285 Ohm
Lead Channel Impedance Value: 304 Ohm
Lead Channel Impedance Value: 342 Ohm
Lead Channel Impedance Value: 342 Ohm
Lead Channel Impedance Value: 361 Ohm
Lead Channel Impedance Value: 361 Ohm
Lead Channel Impedance Value: 418 Ohm
Lead Channel Impedance Value: 418 Ohm
Lead Channel Impedance Value: 475 Ohm
Lead Channel Impedance Value: 513 Ohm
Lead Channel Impedance Value: 532 Ohm
Lead Channel Pacing Threshold Amplitude: 0.5 V
Lead Channel Pacing Threshold Amplitude: 0.625 V
Lead Channel Pacing Threshold Amplitude: 0.875 V
Lead Channel Pacing Threshold Pulse Width: 0.4 ms
Lead Channel Pacing Threshold Pulse Width: 0.4 ms
Lead Channel Pacing Threshold Pulse Width: 0.4 ms
Lead Channel Sensing Intrinsic Amplitude: 11.5 mV
Lead Channel Sensing Intrinsic Amplitude: 11.5 mV
Lead Channel Sensing Intrinsic Amplitude: 3.875 mV
Lead Channel Sensing Intrinsic Amplitude: 3.875 mV
Lead Channel Setting Pacing Amplitude: 1.5 V
Lead Channel Setting Pacing Amplitude: 2 V
Lead Channel Setting Pacing Amplitude: 2.5 V
Lead Channel Setting Pacing Pulse Width: 0.4 ms
Lead Channel Setting Pacing Pulse Width: 0.4 ms
Lead Channel Setting Sensing Sensitivity: 0.3 mV

## 2021-03-12 NOTE — Progress Notes (Signed)
Remote ICD transmission.   

## 2021-06-04 ENCOUNTER — Ambulatory Visit (INDEPENDENT_AMBULATORY_CARE_PROVIDER_SITE_OTHER): Payer: Medicare Other

## 2021-06-04 DIAGNOSIS — I255 Ischemic cardiomyopathy: Secondary | ICD-10-CM | POA: Diagnosis not present

## 2021-06-04 LAB — CUP PACEART REMOTE DEVICE CHECK
Battery Remaining Longevity: 22 mo
Battery Voltage: 2.93 V
Brady Statistic AP VP Percent: 14.62 %
Brady Statistic AP VS Percent: 0.1 %
Brady Statistic AS VP Percent: 83.06 %
Brady Statistic AS VS Percent: 2.22 %
Brady Statistic RA Percent Paced: 14.52 %
Brady Statistic RV Percent Paced: 94.56 %
Date Time Interrogation Session: 20221222104227
HighPow Impedance: 65 Ohm
Implantable Lead Implant Date: 20171212
Implantable Lead Implant Date: 20171212
Implantable Lead Implant Date: 20171212
Implantable Lead Location: 753858
Implantable Lead Location: 753859
Implantable Lead Location: 753860
Implantable Lead Model: 4298
Implantable Lead Model: 5076
Implantable Pulse Generator Implant Date: 20171212
Lead Channel Impedance Value: 132.321
Lead Channel Impedance Value: 132.321
Lead Channel Impedance Value: 143.419
Lead Channel Impedance Value: 155.455
Lead Channel Impedance Value: 155.455
Lead Channel Impedance Value: 247 Ohm
Lead Channel Impedance Value: 285 Ohm
Lead Channel Impedance Value: 285 Ohm
Lead Channel Impedance Value: 285 Ohm
Lead Channel Impedance Value: 304 Ohm
Lead Channel Impedance Value: 342 Ohm
Lead Channel Impedance Value: 361 Ohm
Lead Channel Impedance Value: 399 Ohm
Lead Channel Impedance Value: 418 Ohm
Lead Channel Impedance Value: 456 Ohm
Lead Channel Impedance Value: 456 Ohm
Lead Channel Impedance Value: 456 Ohm
Lead Channel Impedance Value: 513 Ohm
Lead Channel Pacing Threshold Amplitude: 0.5 V
Lead Channel Pacing Threshold Amplitude: 0.625 V
Lead Channel Pacing Threshold Amplitude: 1 V
Lead Channel Pacing Threshold Pulse Width: 0.4 ms
Lead Channel Pacing Threshold Pulse Width: 0.4 ms
Lead Channel Pacing Threshold Pulse Width: 0.4 ms
Lead Channel Sensing Intrinsic Amplitude: 10.375 mV
Lead Channel Sensing Intrinsic Amplitude: 10.375 mV
Lead Channel Sensing Intrinsic Amplitude: 3.125 mV
Lead Channel Sensing Intrinsic Amplitude: 3.125 mV
Lead Channel Setting Pacing Amplitude: 1.5 V
Lead Channel Setting Pacing Amplitude: 2 V
Lead Channel Setting Pacing Amplitude: 2.5 V
Lead Channel Setting Pacing Pulse Width: 0.4 ms
Lead Channel Setting Pacing Pulse Width: 0.4 ms
Lead Channel Setting Sensing Sensitivity: 0.3 mV

## 2021-06-16 NOTE — Progress Notes (Signed)
Remote ICD transmission.   

## 2021-07-23 ENCOUNTER — Ambulatory Visit: Payer: Medicare Other | Admitting: Interventional Cardiology

## 2021-08-06 ENCOUNTER — Other Ambulatory Visit: Payer: Self-pay

## 2021-08-06 ENCOUNTER — Ambulatory Visit (INDEPENDENT_AMBULATORY_CARE_PROVIDER_SITE_OTHER): Payer: Medicare Other | Admitting: Cardiology

## 2021-08-06 ENCOUNTER — Encounter: Payer: Self-pay | Admitting: Cardiology

## 2021-08-06 VITALS — BP 90/50 | HR 83 | Ht 72.0 in | Wt 256.6 lb

## 2021-08-06 DIAGNOSIS — I5022 Chronic systolic (congestive) heart failure: Secondary | ICD-10-CM | POA: Diagnosis not present

## 2021-08-06 NOTE — Progress Notes (Signed)
Electrophysiology Office Note   Date:  08/06/2021   ID:  Leanard Dimaio., DOB 25-Aug-1950, MRN [id number]  PCP:  Seward Carol, MD  Cardiologist:  Tamala Julian Primary Electrophysiologist:  Shante Archambeault Meredith Leeds, MD    No chief complaint on file.    History of Present Illness: Matthew Wood. is a 71 y.o. male who presents today for electrophysiology evaluation.     He has a history significant for chronic systolic heart failure, coronary artery disease, hypertension, hyperlipidemia, left bundle branch block, OSA on CPAP, diabetes, CVA, NSTEMI renal disease.  He is now on dialysis.  Left heart catheterization in 2011 showed significant coronary artery disease and he was medically managed.  He is status post Medtronic CRT-D implanted 06/03/2016.  Today, denies symptoms of palpitations, chest pain, shortness of breath, orthopnea, PND, lower extremity edema, claudication, dizziness, presyncope, syncope, bleeding, or neurologic sequela. The patient is tolerating medications without difficulties.  Since being seen he has done well.  He is unfortunately started dialysis within the last year.  His blood pressure is low on dialysis and takes midodrine.  Aside from that, he has no major complaints at this time.   Past Medical History:  Diagnosis Date   Acute respiratory failure ([place]) 10/04/2017   Anemia    Anemia in chronic kidney disease 09/29/2019   Arthritis of right knee 10/05/2017   CAD (coronary artery disease), native coronary artery 11/15/2013   70-80% mid RCA with marked ectasia.    Cardiorenal syndrome with renal failure 10/04/2017   Carotid aneurysm, left ([place]) 11/15/2013   CHF (congestive heart failure) (HCC)    CKD (chronic kidney disease), stage IV ([place]) 07/20/2016   CKD (chronic kidney disease), stage V ([place]) 03/27/2019   Coagulation defect, unspecified ([place]) 09/29/2019   Coronary artery disease    a. LHC 04/27/10 left main patent, 30-40% LAD, mid circumflex 30%, 80% mid RCA  accounting for the appearance of an infract/peri-infract ischemia on nuclear testing, LV EF of 30-45%--> medical therapy   CVA (cerebrovascular accident) ([place]) 2011   potine; "little balance problems since" (05/25/2016)   Dyspnea    Elevated troponin 10/04/2017   End stage renal disease ([place]) 09/29/2019   Erectile dysfunction 11/15/2013   Essential hypertension 11/15/2013   Gout    Gout, unspecified 10/04/2019   Hematuria 10/05/2017   History of kidney stones    "passed them" (05/25/2016)   Hyperlipidemia    Hypertension    Iron deficiency anemia, unspecified 09/29/2019   LBBB (left bundle branch block)    Left pontine CVA ([place]) 11/15/2013   Multinodular goiter    Obesity    OSA (obstructive sleep apnea) 11/15/2013   OSA on CPAP    PONV (postoperative nausea and vomiting)    diarrhea   Presence of permanent cardiac pacemaker    Renal insufficiency    Right upper lobe pneumonia 10/04/2017   Secondary hyperparathyroidism of renal origin ([place]) 09/29/2019   Type 2 diabetes mellitus with nephropathy ([place]) 10/05/2017   Type II diabetes mellitus ([place])    Unspecified sequelae of cerebral infarction 10/04/2019   Wears partial dentures    Past Surgical History:  Procedure Laterality Date   A/V FISTULAGRAM Right 01/31/2020   Procedure: A/V FISTULAGRAM;  Surgeon: Marty Heck, MD;  Location: [facility];  Service: Cardiovascular;  Laterality: Right;   AV FISTULA PLACEMENT Right 05/25/2019   Procedure: ARTERIOVENOUS (AV) FISTULA CREATION RIGHT ARM;  Surgeon: Marty Heck, MD;  Location: [facility];  Service: Vascular;  Laterality: Right;   COLONOSCOPY WITH PROPOFOL N/A 04/16/2014   Procedure: COLONOSCOPY WITH PROPOFOL;  Surgeon: [name], MD;  [facility];  Service: Endoscopy;  Laterality: N/A;   EP IMPLANTABLE DEVICE N/A 05/25/2016   Procedure: BiV ICD Insertion CRT-D;  Surgeon: Paxton Binns Meredith Leeds, MD;  Location: [name];  Service: Cardiovascular;   Laterality: N/A;   INSERT / REPLACE / REMOVE PACEMAKER  05/25/2016   biventricular pacemaker   LAPAROSCOPIC CHOLECYSTECTOMY     PERIPHERAL VASCULAR INTERVENTION N/A 07/24/2020   Procedure: PERIPHERAL VASCULAR INTERVENTION;  Surgeon: Marty Heck, MD;  Location: [facility];  Service: Cardiovascular;  Laterality: N/A;  central fistula stent   REVISON OF ARTERIOVENOUS FISTULA Right 02/14/2020   Procedure: RIGHT ARM ARTERIOVENOUS FISTULA REVISON WITH SIDE BRANCH LIGATION VERSUS CONVERSION TO UPPER ARM FISTULA;  Surgeon: Marty Heck, MD;  Location: [facility];  Service: Vascular;  Laterality: Right;   UPPER EXTREMITY VENOGRAPHY N/A 07/24/2020   Procedure: CENTRAL VENOGRAPHY;  Surgeon: Marty Heck, MD;  Location: [facility];  Service: Cardiovascular;  Laterality: N/A;     Current Outpatient Medications  Medication Sig Dispense Refill   allopurinol (ZYLOPRIM) 300 MG tablet Take 300 mg by mouth daily.     aspirin EC 81 MG tablet Take 81 mg by mouth at bedtime.     b complex-vitamin c-folic acid (NEPHRO-VITE) 0.8 MG TABS tablet Take 1 tablet by mouth daily.      carvedilol (COREG) 12.5 MG tablet Take one tablet by mouth twice daily on Tuesday, Thursday, Saturday and Sunday.  Take one tablet by mouth in the evening on Monday, Wednesday and Friday (dialysis days) 180 tablet 3   Doxercalciferol (HECTOROL IV) Inject into the vein 3 (three) times a week.     HUMALOG KWIKPEN 100 UNIT/ML KiwkPen Inject 15 Units into the skin daily after breakfast.   5   insulin glargine (LANTUS) 100 UNIT/ML injection Inject 28 Units into the skin at bedtime.      lidocaine-prilocaine (EMLA) cream Apply 1 application topically as needed (prior to port being accessed).     Methoxy PEG-Epoetin Beta (MIRCERA IJ) Inject 50 g as directed every 14 (fourteen) days.     midodrine (PROAMATINE) 5 MG tablet Take 1-2 tabs at the start of dialysis and 1 tab if needed during dialysis     NON FORMULARY  CPAP: At bedtime     rosuvastatin (CRESTOR) 20 MG tablet Take 20 mg by mouth at bedtime.     sevelamer carbonate (RENVELA) 800 MG tablet Take 800-2,400 mg by mouth See admin instructions. TAKE 2 TABLETS (1600 MG) BY MOUTH WITH EACH MEAL & TAKE 1 TABLET (800 MG) BY MOUTH WITH A SNACK.     Current Facility-Administered Medications  Medication Dose Route Frequency Provider Last Rate Last Admin   0.9 %  sodium chloride infusion  250 mL Intravenous PRN Marty Heck, MD        Allergies:   Cinacalcet hcl   Social History:  The patient  reports that he quit smoking about 11 years ago. His smoking use included cigarettes. He has a 13.50 pack-year smoking history. He has never used smokeless tobacco. He reports current alcohol use. He reports that he does not use drugs.   Family History:  The patient's family history includes Breast cancer in his mother; Diabetes type II in his father; Heart attack in his father; Hypertension in his mother and sister; Stroke  in his father.   ROS:  Please see the history of present illness.   Otherwise, review of systems is positive for none.   All other systems are reviewed and negative.   PHYSICAL EXAM: VS:  BP (!) 90/50    Pulse 83    Ht 6' (1.829 m)    Wt 256 lb 9.6 oz (116.4 kg)    SpO2 93%    BMI 34.80 kg/m  , BMI Body mass index is 34.8 kg/m. GEN: Well nourished, well developed, in no acute distress  HEENT: normal  Neck: no JVD, carotid bruits, or masses Cardiac: RRR; no murmurs, rubs, or gallops,no edema  Respiratory:  clear to auscultation bilaterally, normal work of breathing GI: soft, nontender, nondistended, + BS MS: no deformity or atrophy  Skin: warm and dry, device site well healed Neuro:  Strength and sensation are intact Psych: euthymic mood, full affect  EKG:  EKG is ordered today. Personal review of the ekg ordered shows A sense, V paced  Personal review of the device interrogation today. Results in [place]: No  results found for requested labs within last 8760 hours.    Lipid Panel     Component Value Date/Time   CHOL  02/08/2010 0600    171        ATP III CLASSIFICATION:  <200     mg/dL   Desirable  200-239  mg/dL   Borderline High  >=240    mg/dL   High          TRIG 248 (H) 02/08/2010 0600   HDL 34 (L) 02/08/2010 0600   CHOLHDL 5.0 02/08/2010 0600   VLDL 50 (H) 02/08/2010 0600   LDLCALC  02/08/2010 0600    87        Total Cholesterol/HDL:CHD Risk Coronary Heart Disease Risk Table                     Men   Women  1/2 Average Risk   3.4   3.3  Average Risk       5.0   4.4  2 X Average Risk   9.6   7.1  3 X Average Risk  23.4   11.0        Use the calculated Patient Ratio above and the CHD Risk Table to determine the patient's CHD Risk.        ATP III CLASSIFICATION (LDL):  <100     mg/dL   Optimal  100-129  mg/dL   Near or Above                    Optimal  130-159  mg/dL   Borderline  160-189  mg/dL   High  >190     mg/dL   Very High     Wt Readings from Last 3 Encounters:  08/06/21 256 lb 9.6 oz (116.4 kg)  01/20/21 253 lb 12.8 oz (115.1 kg)  12/09/20 257 lb (116.6 kg)      Other studies Reviewed: Additional studies/ records that were reviewed today include: TTE 02/19/16  Review of the above records today demonstrates:  - Left ventricle: The cavity size was moderately dilated. Wall   thickness was increased in a pattern of mild LVH. Systolic   function was moderately to severely reduced. The estimated   ejection fraction was in the range of 30% to 35%. Diffuse   hypokinesis. Features are consistent with a pseudonormal left  ventricular filling pattern, with concomitant abnormal relaxation   and increased filling pressure (grade 2 diastolic dysfunction). - Aortic valve: There was no stenosis. There was mild   regurgitation. - Aorta: Mildly dilated aortic root and ascending aorta. Aortic   root dimension: 39 mm (ED). Ascending aortic diameter: 42 mm (S). - Mitral  valve: There was mild regurgitation. - Left atrium: The atrium was moderately dilated. - Right ventricle: The cavity size was normal. Systolic function   was normal. - Right atrium: The atrium was mildly dilated. - Pulmonary arteries: No complete TR doppler jet so unable to   estimate PA systolic pressure. - Systemic veins: IVC not fully visualized. - Pericardium, extracardiac: A trivial pericardial effusion was   identified posterior to the heart.   ASSESSMENT AND PLAN:  1.  Chronic combined systolic and diastolic heart failure: Status post Medtronic CRT-D implanted 06/03/2016.  Currently on carvedilol.  Not able to tolerate other medications due to dialysis and hypotension.  Device functioning appropriately.  No changes at this time.  2.  Coronary artery disease: No current chest pain.  Left heart catheterization with significant disease.  Currently treated medically.  3.  Hypertension: Was low with dialysis.  He is on intermittent doses of carvedilol.  We Qualyn Oyervides continue with current management.  4.  Hyperlipidemia: Continue statin at current dose.  5.  Obstructive sleep apnea: CPAP compliance encouraged.    Current medicines are reviewed at length with the patient today.   The patient does not have concerns regarding his medicines.  The following changes were made today: None  Labs/ tests ordered today include:  Orders Placed This Encounter  Procedures   EKG 12-Lead     Disposition:   FU with Ola Fawver 12 months  Signed, Cornella Emmer Meredith Leeds, MD  08/06/2021 3:22 PM     [address] [place] [address] [phone] (office) [phone] (fax)

## 2021-09-03 ENCOUNTER — Ambulatory Visit (INDEPENDENT_AMBULATORY_CARE_PROVIDER_SITE_OTHER): Payer: Medicare Other

## 2021-09-03 DIAGNOSIS — I5022 Chronic systolic (congestive) heart failure: Secondary | ICD-10-CM

## 2021-09-04 LAB — CUP PACEART REMOTE DEVICE CHECK
Battery Remaining Longevity: 22 mo
Battery Voltage: 2.93 V
Brady Statistic AP VP Percent: 11.1 %
Brady Statistic AP VS Percent: 0.07 %
Brady Statistic AS VP Percent: 86.56 %
Brady Statistic AS VS Percent: 2.26 %
Brady Statistic RA Percent Paced: 11.02 %
Brady Statistic RV Percent Paced: 94.01 %
Date Time Interrogation Session: 20230323033523
HighPow Impedance: 66 Ohm
Implantable Lead Implant Date: 20171212
Implantable Lead Implant Date: 20171212
Implantable Lead Implant Date: 20171212
Implantable Lead Location: 753858
Implantable Lead Location: 753859
Implantable Lead Location: 753860
Implantable Lead Model: 4298
Implantable Lead Model: 5076
Implantable Pulse Generator Implant Date: 20171212
Lead Channel Impedance Value: 142.5 Ohm
Lead Channel Impedance Value: 147.097
Lead Channel Impedance Value: 155.455
Lead Channel Impedance Value: 155.455
Lead Channel Impedance Value: 160.941
Lead Channel Impedance Value: 285 Ohm
Lead Channel Impedance Value: 285 Ohm
Lead Channel Impedance Value: 304 Ohm
Lead Channel Impedance Value: 304 Ohm
Lead Channel Impedance Value: 342 Ohm
Lead Channel Impedance Value: 342 Ohm
Lead Channel Impedance Value: 399 Ohm
Lead Channel Impedance Value: 418 Ohm
Lead Channel Impedance Value: 475 Ohm
Lead Channel Impedance Value: 475 Ohm
Lead Channel Impedance Value: 513 Ohm
Lead Channel Impedance Value: 532 Ohm
Lead Channel Impedance Value: 551 Ohm
Lead Channel Pacing Threshold Amplitude: 0.5 V
Lead Channel Pacing Threshold Amplitude: 0.75 V
Lead Channel Pacing Threshold Amplitude: 1 V
Lead Channel Pacing Threshold Pulse Width: 0.4 ms
Lead Channel Pacing Threshold Pulse Width: 0.4 ms
Lead Channel Pacing Threshold Pulse Width: 0.4 ms
Lead Channel Sensing Intrinsic Amplitude: 3.625 mV
Lead Channel Sensing Intrinsic Amplitude: 3.625 mV
Lead Channel Sensing Intrinsic Amplitude: 6.875 mV
Lead Channel Sensing Intrinsic Amplitude: 6.875 mV
Lead Channel Setting Pacing Amplitude: 1.5 V
Lead Channel Setting Pacing Amplitude: 2 V
Lead Channel Setting Pacing Amplitude: 2.5 V
Lead Channel Setting Pacing Pulse Width: 0.4 ms
Lead Channel Setting Pacing Pulse Width: 0.4 ms
Lead Channel Setting Sensing Sensitivity: 0.3 mV

## 2021-09-11 NOTE — Progress Notes (Signed)
Remote ICD transmission.   

## 2021-10-05 NOTE — Progress Notes (Signed)
?Cardiology Office Note:   ? ?Date:  10/06/2021  ? ?ID:  Matthew Wood., DOB 14-Nov-1950, MRN [id number] ? ?PCP:  Seward Carol, MD  ?Cardiologist:  Sinclair Grooms, MD  ? ?Referring MD: Seward Carol, MD  ? ?Chief Complaint  ?Patient presents with  ? Congestive Heart Failure  ? Coronary Artery Disease  ? Follow-up  ?  End-stage renal disease on hemodialysis  ? ? ?History of Present Illness:   ? ?Matthew Wood. is a 71 y.o. male with a hx of of CAD, HTN, HLD, LBBB, multinodular goiter, OSA on CPAP, DM, CVA and ESRD on dialysis, chronic combined systolic and diastolic heart failure with EF 30-35% range in the setting of ischemic cardiomyopathy and CRT-D implanted 05/2016. ? ? ?[name] is doing okay.  He is on hemodialysis, not a kidney transplant candidate because of severe mixed cardiomyopathy with stage III heart failure, and morbid obesity with sleep apnea that he is unable to treat with CPAP because of intolerance. ? ?He gets dialysis 3 days a week on Monday, Wednesday, and Friday.  He requires midodrine to help support his blood pressure.  He has frequent blood pressure drops while on dialysis that are not associated with chest discomfort or dyspnea.  His only active cardiac medication is carvedilol 12.5 mg twice daily on Tuesday, Thursday, and Friday.  On dialysis days he requires midodrine for blood pressure support.  We have never been able to use RAAS therapy due to kidney disease.  Blood pressures tend to run relatively low even on nondialysis days. ? ?Past Medical History:  ?Diagnosis Date  ? Acute respiratory failure ([place]) 10/04/2017  ? Anemia   ? Anemia in chronic kidney disease 09/29/2019  ? Arthritis of right knee 10/05/2017  ? CAD (coronary artery disease), native coronary artery 11/15/2013  ? 70-80% mid RCA with marked ectasia.   ? Cardiorenal syndrome with renal failure 10/04/2017  ? Carotid aneurysm, left ([place]) 11/15/2013  ? CHF (congestive heart failure) ([place])   ? CKD (chronic kidney  disease), stage IV ([place]) 07/20/2016  ? CKD (chronic kidney disease), stage V ([place]) 03/27/2019  ? Coagulation defect, unspecified ([place]) 09/29/2019  ? Coronary artery disease   ? a. LHC 04/27/10 left main patent, 30-40% LAD, mid circumflex 30%, 80% mid RCA accounting for the appearance of an infract/peri-infract ischemia on nuclear testing, LV EF of 30-45%--> medical therapy  ? CVA (cerebrovascular accident) [facility]) 2011  ? potine; "little balance problems since" (05/25/2016)  ? Dyspnea   ? Elevated troponin 10/04/2017  ? End stage renal disease ([place]) 09/29/2019  ? Erectile dysfunction 11/15/2013  ? Essential hypertension 11/15/2013  ? Gout   ? Gout, unspecified 10/04/2019  ? Hematuria 10/05/2017  ? History of kidney stones   ? "passed them" (05/25/2016)  ? Hyperlipidemia   ? Hypertension   ? Iron deficiency anemia, unspecified 09/29/2019  ? LBBB (left bundle branch block)   ? Left pontine CVA ([place]) 11/15/2013  ? Multinodular goiter   ? Obesity   ? OSA (obstructive sleep apnea) 11/15/2013  ? OSA on CPAP   ? PONV (postoperative nausea and vomiting)   ? diarrhea  ? Presence of permanent cardiac pacemaker   ? Renal insufficiency   ? Right upper lobe pneumonia 10/04/2017  ? Secondary hyperparathyroidism of renal origin ([place]) 09/29/2019  ? Type 2 diabetes mellitus with nephropathy ([place]) 10/05/2017  ? Type II diabetes mellitus ([place])   ? Unspecified sequelae of cerebral infarction 10/04/2019  ?  Wears partial dentures   ? ? ?Past Surgical History:  ?Procedure Laterality Date  ? A/V FISTULAGRAM Right 01/31/2020  ? Procedure: A/V FISTULAGRAM;  Surgeon: Marty Heck, MD;  Location: [facility];  Service: Cardiovascular;  Laterality: Right;  ? AV FISTULA PLACEMENT Right 05/25/2019  ? Procedure: ARTERIOVENOUS (AV) FISTULA CREATION RIGHT ARM;  Surgeon: Marty Heck, MD;  Location: [facility];  Service: Vascular;  Laterality: Right;  ? COLONOSCOPY WITH PROPOFOL N/A 04/16/2014  ? Procedure: COLONOSCOPY WITH PROPOFOL;  Surgeon: [name], MD;  [facility];  Service: Endoscopy;  Laterality: N/A;  ? EP IMPLANTABLE DEVICE N/A 05/25/2016  ? Procedure: BiV ICD Insertion CRT-D;  Surgeon: [name], MD;  Location: [facility];  Service: Cardiovascular;  Laterality: N/A;  ? INSERT / REPLACE / REMOVE PACEMAKER  05/25/2016  ? biventricular pacemaker  ? LAPAROSCOPIC CHOLECYSTECTOMY    ? PERIPHERAL VASCULAR INTERVENTION N/A 07/24/2020  ? Procedure: PERIPHERAL VASCULAR INTERVENTION;  Surgeon: Marty Heck, MD;  Location: [facility];  Service: Cardiovascular;  Laterality: N/A;  central fistula stent  ? REVISON OF ARTERIOVENOUS FISTULA Right 02/14/2020  ? Procedure: RIGHT ARM ARTERIOVENOUS FISTULA REVISON WITH SIDE BRANCH LIGATION VERSUS CONVERSION TO UPPER ARM FISTULA;  Surgeon: Marty Heck, MD;  Location: [facility];  Service: Vascular;  Laterality: Right;  ? UPPER EXTREMITY VENOGRAPHY N/A 07/24/2020  ? Procedure: CENTRAL VENOGRAPHY;  Surgeon: Marty Heck, MD;  Location: [facility];  Service: Cardiovascular;  Laterality: N/A;  ? ? ?Current Medications: ?Current Meds  ?Medication Sig  ? allopurinol (ZYLOPRIM) 300 MG tablet Take 300 mg by mouth daily.  ? aspirin EC 81 MG tablet Take 81 mg by mouth at bedtime.  ? b complex-vitamin c-folic acid (NEPHRO-VITE) 0.8 MG TABS tablet Take 1 tablet by mouth daily.   ? carvedilol (COREG) 12.5 MG tablet Take one tablet by mouth twice daily on Tuesday, Thursday, Saturday and Sunday.  Take one tablet by mouth in the evening on Monday, Wednesday and Friday (dialysis days)  ? Doxercalciferol (HECTOROL IV) 3 (three) times a week.  ? HUMALOG KWIKPEN 100 UNIT/ML KiwkPen Inject 15 Units into the skin daily after breakfast.   ? insulin glargine (LANTUS) 100 UNIT/ML injection Inject 28 Units into the skin at bedtime.   ? lidocaine-prilocaine (EMLA) cream Apply 1 application topically as needed (prior to port being accessed).  ? Methoxy PEG-Epoetin Beta (MIRCERA IJ)  Inject 50 g as directed every 14 (fourteen) days.  ? midodrine (PROAMATINE) 5 MG tablet Take 1-2 tabs at the start of dialysis and 1 tab if needed during dialysis  ? NON FORMULARY CPAP: At bedtime  ? rosuvastatin (CRESTOR) 20 MG tablet Take 20 mg by mouth at bedtime.  ? sevelamer carbonate (RENVELA) 800 MG tablet Take 800-2,400 mg by mouth See admin instructions. TAKE 2 TABLETS (1600 MG) BY MOUTH WITH EACH MEAL & TAKE 1 TABLET (800 MG) BY MOUTH WITH A SNACK.  ? ?Current Facility-Administered Medications for the 10/06/21 encounter (Office Visit) with Belva Crome, MD  ?Medication  ? 0.9 %  sodium chloride infusion  ?  ? ?Allergies:   Cinacalcet hcl  ? ?Social History  ? ?Socioeconomic History  ? Marital status: Married  ?  Spouse name: Not on file  ? Number of children: 1  ? Years of education: Not on file  ? Highest education level: Not on file  ?Occupational History  ? Occupation: Geneticist, molecular  ?  Employer: [employer]  ?Tobacco Use  ? Smoking status: Former  ?  Packs/day: 0.50  ?  Years: 27.00  ?  Pack years: 13.50  ?  Types: Cigarettes  ?  Quit date: 03/25/2010  ?  Years since quitting: 11.5  ? Smokeless tobacco: Never  ?Vaping Use  ? Vaping Use: Never used  ?Substance and Sexual Activity  ? Alcohol use: Yes  ?  Comment: rare  ? Drug use: No  ? Sexual activity: Not on file  ?Other Topics Concern  ? Not on file  ?Social History Narrative  ? Not on file  ? ?Social Determinants of Health  ? ?Financial Resource Strain: Not on file  ?Food Insecurity: Not on file  ?Transportation Needs: Not on file  ?Physical Activity: Not on file  ?Stress: Not on file  ?Social Connections: Not on file  ?  ? ?Family History: ?The patient's family history includes Breast cancer in his mother; Diabetes type II in his father; Heart attack in his father; Hypertension in his mother and sister; Stroke in his father. ? ?ROS:   ?Please see the history of present illness.    ?He is concerned about his parathyroid.  He has frequent early  satiety, nausea, and regurgitation.  He likely has gastroparesis.  All other systems reviewed and are negative. ? ?EKGs/Labs/Other Studies Reviewed:   ? ?The following studies were reviewed today: ?No new cardiac i

## 2021-10-06 ENCOUNTER — Ambulatory Visit (INDEPENDENT_AMBULATORY_CARE_PROVIDER_SITE_OTHER): Payer: Medicare Other | Admitting: Interventional Cardiology

## 2021-10-06 ENCOUNTER — Encounter: Payer: Self-pay | Admitting: Interventional Cardiology

## 2021-10-06 VITALS — BP 82/45 | HR 78 | Ht 72.0 in | Wt 262.2 lb

## 2021-10-06 DIAGNOSIS — G4733 Obstructive sleep apnea (adult) (pediatric): Secondary | ICD-10-CM

## 2021-10-06 DIAGNOSIS — I2510 Atherosclerotic heart disease of native coronary artery without angina pectoris: Secondary | ICD-10-CM

## 2021-10-06 DIAGNOSIS — N186 End stage renal disease: Secondary | ICD-10-CM

## 2021-10-06 DIAGNOSIS — I10 Essential (primary) hypertension: Secondary | ICD-10-CM

## 2021-10-06 DIAGNOSIS — E782 Mixed hyperlipidemia: Secondary | ICD-10-CM

## 2021-10-06 DIAGNOSIS — I5022 Chronic systolic (congestive) heart failure: Secondary | ICD-10-CM

## 2021-10-06 DIAGNOSIS — Z95810 Presence of automatic (implantable) cardiac defibrillator: Secondary | ICD-10-CM

## 2021-10-06 DIAGNOSIS — E1121 Type 2 diabetes mellitus with diabetic nephropathy: Secondary | ICD-10-CM

## 2021-10-06 NOTE — Patient Instructions (Signed)
Medication Instructions:  ?Your physician recommends that you continue on your current medications as directed. Please refer to the Current Medication list given to you today. ? ?*If you need a refill on your cardiac medications before your next appointment, please call your pharmacy* ? ? ?Lab Work: ?NONE ?If you have labs (blood work) drawn today and your tests are completely normal, you will receive your results only by: ?MyChart Message (if you have MyChart) OR ?A paper copy in the mail ?If you have any lab test that is abnormal or we need to change your treatment, we will call you to review the results. ? ? ?Testing/Procedures: ?NONE ? ? ?Follow-Up: ?At [facility], you and your health needs are our priority.  As part of our continuing mission to provide you with exceptional heart care, we have created designated Provider Care Teams.  These Care Teams include your primary Cardiologist (physician) and Advanced Practice Providers (APPs -  Physician Assistants and Nurse Practitioners) who all work together to provide you with the care you need, when you need it. ? ?We recommend signing up for the patient portal called "MyChart".  Sign up information is provided on this After Visit Summary.  MyChart is used to connect with patients for Virtual Visits (Telemedicine).  Patients are able to view lab/test results, encounter notes, upcoming appointments, etc.  Non-urgent messages can be sent to your provider as well.   ?To learn more about what you can do with MyChart, go to [email or web address].   ? ?Your next appointment:   ?9 -12  month(s) ? ?The format for your next appointment:   ?In Person ? ?Provider:   ?Sinclair Grooms, MD   ? ? ?Other Instructions ? ?GASTROPARESIS  ? ?Important Information About Sugar ? ? ? ? ?  ?

## 2021-11-04 ENCOUNTER — Other Ambulatory Visit: Payer: Self-pay

## 2021-11-04 DIAGNOSIS — N186 End stage renal disease: Secondary | ICD-10-CM

## 2021-11-12 ENCOUNTER — Ambulatory Visit (HOSPITAL_COMMUNITY)
Admission: RE | Admit: 2021-11-12 | Discharge: 2021-11-12 | Disposition: A | Discharge status: Home / Self Care | Payer: Medicare Other | Source: Ambulatory Visit | Attending: Vascular Surgery | Admitting: Vascular Surgery

## 2021-11-12 DIAGNOSIS — N186 End stage renal disease: Secondary | ICD-10-CM | POA: Diagnosis not present

## 2021-11-17 ENCOUNTER — Encounter: Payer: Self-pay | Admitting: Vascular Surgery

## 2021-11-17 ENCOUNTER — Ambulatory Visit (INDEPENDENT_AMBULATORY_CARE_PROVIDER_SITE_OTHER): Payer: Medicare Other | Admitting: Vascular Surgery

## 2021-11-17 VITALS — BP 109/58 | HR 70 | Temp 98.0°F | Resp 18 | Ht 72.0 in | Wt 256.0 lb

## 2021-11-17 DIAGNOSIS — N186 End stage renal disease: Secondary | ICD-10-CM | POA: Diagnosis not present

## 2021-11-17 NOTE — Progress Notes (Signed)
Patient name: Matthew Wood. MRN: [id number] DOB: 1950/12/15 Sex: male  REASON FOR VISIT: Central venous occlusion  HPI: Matthew Wood. is a 71 y.o. male with history of end-stage renal disease now on dialysis Monday Wednesday Friday, coronary artery disease, CHF that presents as a referral from Dr. Augustin Coupe for evaluation of central venous occlusion. He is well-known to our practice and had a right radiocephalic on 3/81/8299 and then later had sidebranch ligation x5 on 02/14/20.  On 07/24/2020 he underwent angioplasty and stent of the right innominate vein for central venous occlusion and right arm and neck swelling.  The right innominate stent is now known to be occluded.  He is referred back by Dr. Augustin Coupe.  Patient states the fistula is working great in his right arm even though the central stent has clotted.  He has no arm swelling.  He has no other concerns.  Past Medical History:  Diagnosis Date   Acute respiratory failure ([place]) 10/04/2017   Anemia    Anemia in chronic kidney disease 09/29/2019   Arthritis of right knee 10/05/2017   CAD (coronary artery disease), native coronary artery 11/15/2013   70-80% mid RCA with marked ectasia.    Cardiorenal syndrome with renal failure 10/04/2017   Carotid aneurysm, left ([place]) 11/15/2013   CHF (congestive heart failure) (HCC)    CKD (chronic kidney disease), stage IV ([place]) 07/20/2016   CKD (chronic kidney disease), stage V ([place]) 03/27/2019   Coagulation defect, unspecified ([place]) 09/29/2019   Coronary artery disease    a. LHC 04/27/10 left main patent, 30-40% LAD, mid circumflex 30%, 80% mid RCA accounting for the appearance of an infract/peri-infract ischemia on nuclear testing, LV EF of 30-45%--> medical therapy   CVA (cerebrovascular accident) ([place]) 2011   potine; "little balance problems since" (05/25/2016)   Dyspnea    Elevated troponin 10/04/2017   End stage renal disease ([place]) 09/29/2019   Erectile dysfunction 11/15/2013   Essential hypertension  11/15/2013   Gout    Gout, unspecified 10/04/2019   Hematuria 10/05/2017   History of kidney stones    "passed them" (05/25/2016)   Hyperlipidemia    Hypertension    Iron deficiency anemia, unspecified 09/29/2019   LBBB (left bundle branch block)    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)    Unspecified sequelae of cerebral infarction 10/04/2019   Wears partial dentures     Past Surgical History:  Procedure Laterality Date   A/V FISTULAGRAM Right 01/31/2020   Procedure: A/V FISTULAGRAM;  Surgeon: Marty Heck, MD;  Location: [facility];  Service: Cardiovascular;  Laterality: Right;   AV FISTULA PLACEMENT Right 05/25/2019   Procedure: ARTERIOVENOUS (AV) FISTULA CREATION RIGHT ARM;  Surgeon: Marty Heck, MD;  Location: [facility];  Service: Vascular;  Laterality: Right;   COLONOSCOPY WITH PROPOFOL N/A 04/16/2014   Procedure: COLONOSCOPY WITH PROPOFOL;  Surgeon: [name], MD;  [facility];  Service: Endoscopy;  Laterality: N/A;   EP IMPLANTABLE DEVICE N/A 05/25/2016   Procedure: BiV  ICD Insertion CRT-D;  Surgeon: [name], MD;  Location: [facility];  Service: Cardiovascular;  Laterality: N/A;   INSERT / REPLACE / REMOVE PACEMAKER  05/25/2016   biventricular pacemaker   LAPAROSCOPIC CHOLECYSTECTOMY     PERIPHERAL VASCULAR INTERVENTION N/A 07/24/2020   Procedure: PERIPHERAL VASCULAR INTERVENTION;  Surgeon: Marty Heck, MD;  Location: [facility];  Service: Cardiovascular;  Laterality: N/A;  central fistula stent   REVISON  OF ARTERIOVENOUS FISTULA Right 02/14/2020   Procedure: RIGHT ARM ARTERIOVENOUS FISTULA REVISON WITH SIDE BRANCH LIGATION VERSUS CONVERSION TO UPPER ARM FISTULA;  Surgeon: Marty Heck, MD;  Location: [place] [facility];  Service: Vascular;  Laterality: Right;   UPPER EXTREMITY VENOGRAPHY N/A 07/24/2020   Procedure: CENTRAL VENOGRAPHY;  Surgeon: Marty Heck, MD;  Location: [facility];  Service: Cardiovascular;  Laterality: N/A;    Family History  Problem Relation Age of Onset   Hypertension Mother    Breast cancer Mother    Heart attack Father    Stroke Father    Diabetes type II Father    Hypertension Sister     SOCIAL HISTORY: Social History   Tobacco Use   Smoking status: Former    Packs/day: 0.50    Years: 27.00    Pack years: 13.50    Types: Cigarettes    Quit date: 03/25/2010    Years since quitting: 11.6   Smokeless tobacco: Never  Substance Use Topics   Alcohol use: Yes    Comment: rare    Allergies  Allergen Reactions   Cinacalcet Hcl Nausea And Vomiting    (SENSIPAR)    Current Outpatient Medications  Medication Sig Dispense Refill   allopurinol (ZYLOPRIM) 300 MG tablet Take 300 mg by mouth daily.     aspirin EC 81 MG tablet Take 81 mg by mouth at bedtime.     b complex-vitamin c-folic acid (NEPHRO-VITE) 0.8 MG TABS tablet Take 1 tablet by mouth daily.      carvedilol (COREG) 12.5 MG tablet Take one tablet by mouth twice daily on Tuesday, Thursday, Saturday and Sunday.  Take one tablet by mouth in the evening on Monday, Wednesday and Friday (dialysis days) 180 tablet 3   Doxercalciferol (HECTOROL IV) 3 (three) times a week.     HUMALOG KWIKPEN 100 UNIT/ML KiwkPen Inject 15 Units into the skin daily after breakfast.   5   insulin glargine (LANTUS) 100 UNIT/ML injection Inject 28 Units into the skin at bedtime.      lidocaine-prilocaine (EMLA) cream Apply 1 application topically as needed (prior to port being accessed).     Methoxy PEG-Epoetin Beta  (MIRCERA IJ) Inject 50 g as directed every 14 (fourteen) days.     midodrine (PROAMATINE) 5 MG tablet Take 1-2 tabs at the start of dialysis and 1 tab if needed during dialysis     NON FORMULARY CPAP: At bedtime     rosuvastatin (CRESTOR) 20 MG tablet Take 20 mg by mouth at bedtime.     sevelamer carbonate (RENVELA) 800 MG tablet Take 800-2,400 mg by mouth See admin instructions. TAKE 2 TABLETS (1600 MG) BY MOUTH WITH EACH MEAL & TAKE 1 TABLET (800 MG) BY MOUTH WITH A SNACK.     Current Facility-Administered Medications  Medication Dose Route Frequency Provider Last Rate Last Admin   0.9 %  sodium chloride infusion  250 mL Intravenous PRN Marty Heck, MD        REVIEW OF SYSTEMS:  [  X] denotes positive finding, '[ ]'$  denotes negative finding Cardiac  Comments:  Chest pain or chest pressure:    Shortness of breath upon exertion:    Short of breath when lying flat:    Irregular heart rhythm:        Vascular    Pain in calf, thigh, or hip brought on by ambulation:    Pain in feet at night that wakes you up from your sleep:     Blood clot in your veins:    Leg swelling:         Pulmonary    Oxygen at home:    Productive cough:     Wheezing:         Neurologic    Sudden weakness in arms or legs:     Sudden numbness in arms or legs:     Sudden onset of difficulty speaking or slurred speech:    Temporary loss of vision in one eye:     Problems with dizziness:         Gastrointestinal    Blood in stool:     Vomited blood:         Genitourinary    Burning when urinating:     Blood in urine:        Psychiatric    Major depression:         Hematologic    Bleeding problems:    Problems with blood clotting too easily:        Skin    Rashes or ulcers:        Constitutional    Fever or chills:      PHYSICAL EXAM: Vitals:   11/17/21 1437  BP: (!) 109/58  Pulse: 70  Resp: 18  Temp: 98 F (36.7 C)  TempSrc: Temporal  SpO2: 94%  Weight: 256 lb (116.1 kg)   Height: 6' (1.829 m)    GENERAL: The patient is a well-nourished male, in no acute distress. The vital signs are documented above. CARDIAC: There is a regular rate and rhythm.  VASCULAR:  Right radiocephalic fistula with good thrill No significant right arm swelling PULMONARY: No respiratory distress. ABDOMEN: Soft and non-tender. MUSCULOSKELETAL: There are no major deformities or cyanosis. NEUROLOGIC: No focal weakness or paresthesias are detected. PSYCHIATRIC: The patient has a normal affect.  DATA:   Fistula duplex shows good flow volumes of 670 mL/min in right arm AVF.  Assessment/Plan:  71 year old male with a right radiocephalic AV fistula that ultimately required central venous stenting for right innominate occlusion.  The stent is now known to be occluded after fistulogram with [facility].  I discussed options with the patient including returning to the Cath Lab for reintervention.  That being said his fistula has an excellent thrill on exam and he states this has been working well in dialysis over the last several months over what timeframe we have known the stent to be occluded.  He has no upper extremity swelling or neck swelling.  Discussed other option would be to continue using the fistula and he can let me know if there are problems.  Discussed risk that this could fail at some point and would require new access likely in the left arm.   Marty Heck, MD [facility] Office: [phone]

## 2021-12-03 ENCOUNTER — Ambulatory Visit (INDEPENDENT_AMBULATORY_CARE_PROVIDER_SITE_OTHER): Payer: Medicare Other

## 2021-12-03 DIAGNOSIS — I255 Ischemic cardiomyopathy: Secondary | ICD-10-CM | POA: Diagnosis not present

## 2021-12-03 LAB — CUP PACEART REMOTE DEVICE CHECK
Battery Remaining Longevity: 20 mo
Battery Voltage: 2.92 V
Brady Statistic AP VP Percent: 13.06 %
Brady Statistic AP VS Percent: 0.1 %
Brady Statistic AS VP Percent: 84.5 %
Brady Statistic AS VS Percent: 2.33 %
Brady Statistic RA Percent Paced: 12.93 %
Brady Statistic RV Percent Paced: 93.84 %
Date Time Interrogation Session: 20230622044224
HighPow Impedance: 62 Ohm
Implantable Lead Implant Date: 20171212
Implantable Lead Implant Date: 20171212
Implantable Lead Implant Date: 20171212
Implantable Lead Location: 753858
Implantable Lead Location: 753859
Implantable Lead Location: 753860
Implantable Lead Model: 4298
Implantable Lead Model: 5076
Implantable Pulse Generator Implant Date: 20171212
Lead Channel Impedance Value: 132.321
Lead Channel Impedance Value: 132.321
Lead Channel Impedance Value: 136.276
Lead Channel Impedance Value: 147.097
Lead Channel Impedance Value: 147.097
Lead Channel Impedance Value: 247 Ohm
Lead Channel Impedance Value: 285 Ohm
Lead Channel Impedance Value: 285 Ohm
Lead Channel Impedance Value: 304 Ohm
Lead Channel Impedance Value: 304 Ohm
Lead Channel Impedance Value: 342 Ohm
Lead Channel Impedance Value: 361 Ohm
Lead Channel Impedance Value: 399 Ohm
Lead Channel Impedance Value: 418 Ohm
Lead Channel Impedance Value: 456 Ohm
Lead Channel Impedance Value: 513 Ohm
Lead Channel Impedance Value: 513 Ohm
Lead Channel Impedance Value: 513 Ohm
Lead Channel Pacing Threshold Amplitude: 0.5 V
Lead Channel Pacing Threshold Amplitude: 0.625 V
Lead Channel Pacing Threshold Amplitude: 0.875 V
Lead Channel Pacing Threshold Pulse Width: 0.4 ms
Lead Channel Pacing Threshold Pulse Width: 0.4 ms
Lead Channel Pacing Threshold Pulse Width: 0.4 ms
Lead Channel Sensing Intrinsic Amplitude: 3.375 mV
Lead Channel Sensing Intrinsic Amplitude: 3.375 mV
Lead Channel Sensing Intrinsic Amplitude: 6.625 mV
Lead Channel Sensing Intrinsic Amplitude: 6.625 mV
Lead Channel Setting Pacing Amplitude: 1.5 V
Lead Channel Setting Pacing Amplitude: 2 V
Lead Channel Setting Pacing Amplitude: 2.5 V
Lead Channel Setting Pacing Pulse Width: 0.4 ms
Lead Channel Setting Pacing Pulse Width: 0.4 ms
Lead Channel Setting Sensing Sensitivity: 0.3 mV

## 2021-12-11 NOTE — Progress Notes (Signed)
Remote ICD transmission.   

## 2022-03-04 ENCOUNTER — Ambulatory Visit (INDEPENDENT_AMBULATORY_CARE_PROVIDER_SITE_OTHER): Payer: Medicare Other

## 2022-03-04 DIAGNOSIS — I255 Ischemic cardiomyopathy: Secondary | ICD-10-CM

## 2022-03-05 LAB — CUP PACEART REMOTE DEVICE CHECK
Battery Remaining Longevity: 21 mo
Battery Voltage: 2.91 V
Brady Statistic AP VP Percent: 13.07 %
Brady Statistic AP VS Percent: 0.11 %
Brady Statistic AS VP Percent: 84.03 %
Brady Statistic AS VS Percent: 2.79 %
Brady Statistic RA Percent Paced: 12.93 %
Brady Statistic RV Percent Paced: 93.8 %
Date Time Interrogation Session: 20230921063625
HighPow Impedance: 60 Ohm
Implantable Lead Implant Date: 20171212
Implantable Lead Implant Date: 20171212
Implantable Lead Implant Date: 20171212
Implantable Lead Location: 753858
Implantable Lead Location: 753859
Implantable Lead Location: 753860
Implantable Lead Model: 4298
Implantable Lead Model: 5076
Implantable Pulse Generator Implant Date: 20171212
Lead Channel Impedance Value: 142.5 Ohm
Lead Channel Impedance Value: 147.097
Lead Channel Impedance Value: 155.455
Lead Channel Impedance Value: 155.455
Lead Channel Impedance Value: 160.941
Lead Channel Impedance Value: 285 Ohm
Lead Channel Impedance Value: 285 Ohm
Lead Channel Impedance Value: 285 Ohm
Lead Channel Impedance Value: 304 Ohm
Lead Channel Impedance Value: 342 Ohm
Lead Channel Impedance Value: 342 Ohm
Lead Channel Impedance Value: 361 Ohm
Lead Channel Impedance Value: 399 Ohm
Lead Channel Impedance Value: 456 Ohm
Lead Channel Impedance Value: 475 Ohm
Lead Channel Impedance Value: 513 Ohm
Lead Channel Impedance Value: 513 Ohm
Lead Channel Impedance Value: 532 Ohm
Lead Channel Pacing Threshold Amplitude: 0.5 V
Lead Channel Pacing Threshold Amplitude: 0.75 V
Lead Channel Pacing Threshold Amplitude: 1.125 V
Lead Channel Pacing Threshold Pulse Width: 0.4 ms
Lead Channel Pacing Threshold Pulse Width: 0.4 ms
Lead Channel Pacing Threshold Pulse Width: 0.4 ms
Lead Channel Sensing Intrinsic Amplitude: 3.25 mV
Lead Channel Sensing Intrinsic Amplitude: 3.25 mV
Lead Channel Sensing Intrinsic Amplitude: 7.875 mV
Lead Channel Sensing Intrinsic Amplitude: 7.875 mV
Lead Channel Setting Pacing Amplitude: 1.75 V
Lead Channel Setting Pacing Amplitude: 2 V
Lead Channel Setting Pacing Amplitude: 2.5 V
Lead Channel Setting Pacing Pulse Width: 0.4 ms
Lead Channel Setting Pacing Pulse Width: 0.4 ms
Lead Channel Setting Sensing Sensitivity: 0.3 mV

## 2022-03-15 NOTE — Progress Notes (Signed)
Remote ICD transmission.   

## 2022-06-03 ENCOUNTER — Ambulatory Visit (INDEPENDENT_AMBULATORY_CARE_PROVIDER_SITE_OTHER): Payer: Medicare Other

## 2022-06-03 DIAGNOSIS — I255 Ischemic cardiomyopathy: Secondary | ICD-10-CM

## 2022-06-03 LAB — CUP PACEART REMOTE DEVICE CHECK
Battery Remaining Longevity: 18 mo
Battery Voltage: 2.9 V
Brady Statistic AP VP Percent: 13.68 %
Brady Statistic AP VS Percent: 0.1 %
Brady Statistic AS VP Percent: 82.86 %
Brady Statistic AS VS Percent: 3.37 %
Brady Statistic RA Percent Paced: 13.48 %
Brady Statistic RV Percent Paced: 93.26 %
Date Time Interrogation Session: 20231221012505
HighPow Impedance: 65 Ohm
Implantable Lead Connection Status: 753985
Implantable Lead Connection Status: 753985
Implantable Lead Connection Status: 753985
Implantable Lead Implant Date: 20171212
Implantable Lead Implant Date: 20171212
Implantable Lead Implant Date: 20171212
Implantable Lead Location: 753858
Implantable Lead Location: 753859
Implantable Lead Location: 753860
Implantable Lead Model: 4298
Implantable Lead Model: 5076
Implantable Pulse Generator Implant Date: 20171212
Lead Channel Impedance Value: 142.5 Ohm
Lead Channel Impedance Value: 142.5 Ohm
Lead Channel Impedance Value: 155.455
Lead Channel Impedance Value: 155.455
Lead Channel Impedance Value: 155.455
Lead Channel Impedance Value: 285 Ohm
Lead Channel Impedance Value: 285 Ohm
Lead Channel Impedance Value: 285 Ohm
Lead Channel Impedance Value: 285 Ohm
Lead Channel Impedance Value: 342 Ohm
Lead Channel Impedance Value: 342 Ohm
Lead Channel Impedance Value: 361 Ohm
Lead Channel Impedance Value: 361 Ohm
Lead Channel Impedance Value: 418 Ohm
Lead Channel Impedance Value: 456 Ohm
Lead Channel Impedance Value: 513 Ohm
Lead Channel Impedance Value: 513 Ohm
Lead Channel Impedance Value: 513 Ohm
Lead Channel Pacing Threshold Amplitude: 0.5 V
Lead Channel Pacing Threshold Amplitude: 0.625 V
Lead Channel Pacing Threshold Amplitude: 1 V
Lead Channel Pacing Threshold Pulse Width: 0.4 ms
Lead Channel Pacing Threshold Pulse Width: 0.4 ms
Lead Channel Pacing Threshold Pulse Width: 0.4 ms
Lead Channel Sensing Intrinsic Amplitude: 3.125 mV
Lead Channel Sensing Intrinsic Amplitude: 3.125 mV
Lead Channel Sensing Intrinsic Amplitude: 8.625 mV
Lead Channel Sensing Intrinsic Amplitude: 8.625 mV
Lead Channel Setting Pacing Amplitude: 1.5 V
Lead Channel Setting Pacing Amplitude: 2 V
Lead Channel Setting Pacing Amplitude: 2.5 V
Lead Channel Setting Pacing Pulse Width: 0.4 ms
Lead Channel Setting Pacing Pulse Width: 0.4 ms
Lead Channel Setting Sensing Sensitivity: 0.3 mV
Zone Setting Status: 755011
Zone Setting Status: 755011

## 2022-06-25 NOTE — Progress Notes (Signed)
Remote ICD transmission.   

## 2022-09-02 ENCOUNTER — Ambulatory Visit (INDEPENDENT_AMBULATORY_CARE_PROVIDER_SITE_OTHER): Payer: Medicare Other

## 2022-09-02 DIAGNOSIS — I255 Ischemic cardiomyopathy: Secondary | ICD-10-CM

## 2022-09-02 LAB — CUP PACEART REMOTE DEVICE CHECK
Battery Remaining Longevity: 16 mo
Battery Voltage: 2.89 V
Brady Statistic AP VP Percent: 12.82 %
Brady Statistic AP VS Percent: 0.1 %
Brady Statistic AS VP Percent: 83.8 %
Brady Statistic AS VS Percent: 3.29 %
Brady Statistic RA Percent Paced: 12.63 %
Brady Statistic RV Percent Paced: 92.57 %
Date Time Interrogation Session: 20240321052824
HighPow Impedance: 62 Ohm
Implantable Lead Connection Status: 753985
Implantable Lead Connection Status: 753985
Implantable Lead Connection Status: 753985
Implantable Lead Implant Date: 20171212
Implantable Lead Implant Date: 20171212
Implantable Lead Implant Date: 20171212
Implantable Lead Location: 753858
Implantable Lead Location: 753859
Implantable Lead Location: 753860
Implantable Lead Model: 4298
Implantable Lead Model: 5076
Implantable Pulse Generator Implant Date: 20171212
Lead Channel Impedance Value: 123.5 Ohm
Lead Channel Impedance Value: 136.276
Lead Channel Impedance Value: 143.419
Lead Channel Impedance Value: 143.419
Lead Channel Impedance Value: 160.941
Lead Channel Impedance Value: 247 Ohm
Lead Channel Impedance Value: 247 Ohm
Lead Channel Impedance Value: 285 Ohm
Lead Channel Impedance Value: 304 Ohm
Lead Channel Impedance Value: 342 Ohm
Lead Channel Impedance Value: 361 Ohm
Lead Channel Impedance Value: 361 Ohm
Lead Channel Impedance Value: 399 Ohm
Lead Channel Impedance Value: 456 Ohm
Lead Channel Impedance Value: 456 Ohm
Lead Channel Impedance Value: 475 Ohm
Lead Channel Impedance Value: 513 Ohm
Lead Channel Impedance Value: 532 Ohm
Lead Channel Pacing Threshold Amplitude: 0.5 V
Lead Channel Pacing Threshold Amplitude: 0.625 V
Lead Channel Pacing Threshold Amplitude: 0.625 V
Lead Channel Pacing Threshold Pulse Width: 0.4 ms
Lead Channel Pacing Threshold Pulse Width: 0.4 ms
Lead Channel Pacing Threshold Pulse Width: 0.4 ms
Lead Channel Sensing Intrinsic Amplitude: 10.625 mV
Lead Channel Sensing Intrinsic Amplitude: 10.625 mV
Lead Channel Sensing Intrinsic Amplitude: 3.375 mV
Lead Channel Sensing Intrinsic Amplitude: 3.375 mV
Lead Channel Setting Pacing Amplitude: 1.25 V
Lead Channel Setting Pacing Amplitude: 2 V
Lead Channel Setting Pacing Amplitude: 2.5 V
Lead Channel Setting Pacing Pulse Width: 0.4 ms
Lead Channel Setting Pacing Pulse Width: 0.4 ms
Lead Channel Setting Sensing Sensitivity: 0.3 mV
Zone Setting Status: 755011
Zone Setting Status: 755011

## 2022-09-07 ENCOUNTER — Encounter: Payer: Self-pay | Admitting: Cardiology

## 2022-09-07 ENCOUNTER — Ambulatory Visit: Payer: Medicare Other | Attending: Cardiology | Admitting: Cardiology

## 2022-09-07 VITALS — BP 110/60 | HR 74 | Ht 72.0 in | Wt 258.0 lb

## 2022-09-07 DIAGNOSIS — I10 Essential (primary) hypertension: Secondary | ICD-10-CM

## 2022-09-07 DIAGNOSIS — G4733 Obstructive sleep apnea (adult) (pediatric): Secondary | ICD-10-CM

## 2022-09-07 DIAGNOSIS — I2510 Atherosclerotic heart disease of native coronary artery without angina pectoris: Secondary | ICD-10-CM

## 2022-09-07 DIAGNOSIS — I5022 Chronic systolic (congestive) heart failure: Secondary | ICD-10-CM

## 2022-09-07 NOTE — Patient Instructions (Signed)
Medication Instructions:  Your physician recommends that you continue on your current medications as directed. Please refer to the Current Medication list given to you today. *If you need a refill on your cardiac medications before your next appointment, please call your pharmacy*  Follow-Up: Please establish with Dr Marlou Porch for general cardiology. At [facility], you and your health needs are our priority.  As part of our continuing mission to provide you with exceptional heart care, we have created designated Provider Care Teams.  These Care Teams include your primary Cardiologist (physician) and Advanced Practice Providers (APPs -  Physician Assistants and Nurse Practitioners) who all work together to provide you with the care you need, when you need it.  We recommend signing up for the patient portal called "MyChart".  Sign up information is provided on this After Visit Summary.  MyChart is used to connect with patients for Virtual Visits (Telemedicine).  Patients are able to view lab/test results, encounter notes, upcoming appointments, etc.  Non-urgent messages can be sent to your provider as well.   To learn more about what you can do with MyChart, go to [email or web address].    Your next appointment:   1 year(s)  Provider:   Allegra Lai, MD

## 2022-09-07 NOTE — Progress Notes (Signed)
Electrophysiology Office Note   Date:  09/07/2022   ID:  Matthew Darragh., DOB 04-08-1951, MRN [id number]  PCP:  Seward Carol, MD  Cardiologist:  Tamala Julian Primary Electrophysiologist:  Auburn Hester Meredith Leeds, MD    No chief complaint on file.    History of Present Illness: Matthew Labine. is a 72 y.o. male who presents today for electrophysiology evaluation.     He has a history significant for chronic systolic heart failure, coronary artery disease, hypertension, hyperlipidemia, left bundle branch block, OSA on CPAP, diabetes, CVA, non-STEMI, renal disease.  He is now on dialysis.  He had a left heart catheterization 2011 that showed coronary artery disease and was managed medically.  He is post Medtronic CRT-D implanted 06/03/2016.  Today, denies symptoms of palpitations, chest pain, shortness of breath, orthopnea, PND, lower extremity edema, claudication, dizziness, presyncope, syncope, bleeding, or neurologic sequela. The patient is tolerating medications without difficulties.     Past Medical History:  Diagnosis Date   Acute respiratory failure ([place]) 10/04/2017   Anemia    Anemia in chronic kidney disease 09/29/2019   Arthritis of right knee 10/05/2017   CAD (coronary artery disease), native coronary artery 11/15/2013   70-80% mid RCA with marked ectasia.    Cardiorenal syndrome with renal failure 10/04/2017   Carotid aneurysm, left ([place]) 11/15/2013   CHF (congestive heart failure) (HCC)    CKD (chronic kidney disease), stage IV ([place]) 07/20/2016   CKD (chronic kidney disease), stage V ([place]) 03/27/2019   Coagulation defect, unspecified ([place]) 09/29/2019   Coronary artery disease    a. LHC 04/27/10 left main patent, 30-40% LAD, mid circumflex 30%, 80% mid RCA accounting for the appearance of an infract/peri-infract ischemia on nuclear testing, LV EF of 30-45%--> medical therapy   CVA (cerebrovascular accident) ([place]) 2011   potine; "little balance problems since" (05/25/2016)    Dyspnea    Elevated troponin 10/04/2017   End stage renal disease ([place]) 09/29/2019   Erectile dysfunction 11/15/2013   Essential hypertension 11/15/2013   Gout    Gout, unspecified 10/04/2019   Hematuria 10/05/2017   History of kidney stones    "passed them" (05/25/2016)   Hyperlipidemia    Hypertension    Iron deficiency anemia, unspecified 09/29/2019   LBBB (left bundle branch block)    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   Multinodular goiter    Obesity    OSA (obstructive sleep apnea) 11/15/2013   OSA on CPAP    PONV (postoperative nausea and vomiting)    diarrhea   Presence of permanent cardiac pacemaker    Renal insufficiency    Right upper lobe pneumonia 10/04/2017   Secondary hyperparathyroidism of renal origin ([place]) 09/29/2019   Type 2 diabetes mellitus with nephropathy ([place]) 10/05/2017   Type II diabetes mellitus ([place])    Unspecified sequelae of cerebral infarction 10/04/2019   Wears partial dentures    Past Surgical History:  Procedure Laterality Date   A/V FISTULAGRAM Right 01/31/2020   Procedure: A/V FISTULAGRAM;  Surgeon: Marty Heck, MD;  Location: [facility];  Service: Cardiovascular;  Laterality: Right;   AV FISTULA PLACEMENT Right 05/25/2019   Procedure: ARTERIOVENOUS (AV) FISTULA CREATION RIGHT ARM;  Surgeon: Marty Heck, MD;  Location: [place];  Service: Vascular;  Laterality: Right;   COLONOSCOPY WITH PROPOFOL N/A 04/16/2014   Procedure: COLONOSCOPY WITH PROPOFOL;  Surgeon: [name], MD;  [facility];  Service: Endoscopy;  Laterality: N/A;   EP IMPLANTABLE  DEVICE N/A 05/25/2016   Procedure: BiV ICD Insertion CRT-D;  Surgeon: Jannet Calip Meredith Leeds, MD;  Location: [facility];  Service: Cardiovascular;  Laterality: N/A;   INSERT / REPLACE / REMOVE PACEMAKER  05/25/2016   biventricular pacemaker   LAPAROSCOPIC CHOLECYSTECTOMY     PERIPHERAL VASCULAR INTERVENTION N/A 07/24/2020   Procedure: PERIPHERAL VASCULAR INTERVENTION;   Surgeon: Marty Heck, MD;  Location: [facility] [facility];  Service: Cardiovascular;  Laterality: N/A;  central fistula stent   REVISON OF ARTERIOVENOUS FISTULA Right 02/14/2020   Procedure: RIGHT ARM ARTERIOVENOUS FISTULA REVISON WITH SIDE BRANCH LIGATION VERSUS CONVERSION TO UPPER ARM FISTULA;  Surgeon: Marty Heck, MD;  Location: [facility];  Service: Vascular;  Laterality: Right;   UPPER EXTREMITY VENOGRAPHY N/A 07/24/2020   Procedure: CENTRAL VENOGRAPHY;  Surgeon: Marty Heck, MD;  Location: [facility];  Service: Cardiovascular;  Laterality: N/A;     Current Outpatient Medications  Medication Sig Dispense Refill   allopurinol (ZYLOPRIM) 300 MG tablet Take 300 mg by mouth daily.     aspirin EC 81 MG tablet Take 81 mg by mouth at bedtime.     AURYXIA 1 GM 210 MG(Fe) tablet Take 210 mg by mouth.     b complex-vitamin c-folic acid (NEPHRO-VITE) 0.8 MG TABS tablet Take 1 tablet by mouth daily.      carvedilol (COREG) 12.5 MG tablet Take one tablet by mouth twice daily on Tuesday, Thursday, Saturday and Sunday.  Take one tablet by mouth in the evening on Monday, Wednesday and Friday (dialysis days) 180 tablet 3   Doxercalciferol (HECTOROL IV) 3 (three) times a week.     HUMALOG KWIKPEN 100 UNIT/ML KiwkPen Inject 15 Units into the skin daily after breakfast.   5   insulin glargine (LANTUS) 100 UNIT/ML injection Inject 28 Units into the skin at bedtime.      lidocaine-prilocaine (EMLA) cream Apply 1 application topically as needed (prior to port being accessed).     Methoxy PEG-Epoetin Beta (MIRCERA IJ) Mircera     midodrine (PROAMATINE) 5 MG tablet Take 1-2 tabs at the start of dialysis and 1 tab if needed during dialysis     NON FORMULARY CPAP: At bedtime     rosuvastatin (CRESTOR) 20 MG tablet Take 20 mg by mouth at bedtime.     sevelamer carbonate (RENVELA) 800 MG tablet Take 800-2,400 mg by mouth See admin instructions. TAKE 2 TABLETS (1600 MG) BY MOUTH WITH EACH  MEAL & TAKE 1 TABLET (800 MG) BY MOUTH WITH A SNACK.     Current Facility-Administered Medications  Medication Dose Route Frequency Provider Last Rate Last Admin   0.9 %  sodium chloride infusion  250 mL Intravenous PRN Marty Heck, MD        Allergies:   Cinacalcet hcl   Social History:  The patient  reports that he quit smoking about 12 years ago. His smoking use included cigarettes. He has a 13.50 pack-year smoking history. He has never used smokeless tobacco. He reports current alcohol use. He reports that he does not use drugs.   Family History:  The patient's family history includes Breast cancer in his mother; Diabetes type II in his father; Heart attack in his father; Hypertension in his mother and sister; Stroke in his father.   ROS:  Please see the history of present illness.   Otherwise, review of systems is positive for none.   All other systems are reviewed and negative.  PHYSICAL EXAM: VS:  BP 110/60   Pulse 74   Ht 6' (1.829 m)   Wt 258 lb (117 kg)   SpO2 98%   BMI 34.99 kg/m  , BMI Body mass index is 34.99 kg/m. GEN: Well nourished, well developed, in no acute distress  HEENT: normal  Neck: no JVD, carotid bruits, or masses Cardiac: RRR; no murmurs, rubs, or gallops,no edema  Respiratory:  clear to auscultation bilaterally, normal work of breathing GI: soft, nontender, nondistended, + BS MS: no deformity or atrophy  Skin: warm and dry, device site well healed Neuro:  Strength and sensation are intact Psych: euthymic mood, full affect  EKG:  EKG is ordered today. Personal review of the ekg ordered shows A sense, V pace  Personal review of the device interrogation today. Results in [place]: No results found for requested labs within last 365 days.    Lipid Panel     Component Value Date/Time   CHOL  02/08/2010 0600    171        ATP III CLASSIFICATION:  <200     mg/dL   Desirable  200-239  mg/dL   Borderline High  >=240     mg/dL   High          TRIG 248 (H) 02/08/2010 0600   HDL 34 (L) 02/08/2010 0600   CHOLHDL 5.0 02/08/2010 0600   VLDL 50 (H) 02/08/2010 0600   LDLCALC  02/08/2010 0600    87        Total Cholesterol/HDL:CHD Risk Coronary Heart Disease Risk Table                     Men   Women  1/2 Average Risk   3.4   3.3  Average Risk       5.0   4.4  2 X Average Risk   9.6   7.1  3 X Average Risk  23.4   11.0        Use the calculated Patient Ratio above and the CHD Risk Table to determine the patient's CHD Risk.        ATP III CLASSIFICATION (LDL):  <100     mg/dL   Optimal  100-129  mg/dL   Near or Above                    Optimal  130-159  mg/dL   Borderline  160-189  mg/dL   High  >190     mg/dL   Very High     Wt Readings from Last 3 Encounters:  09/07/22 258 lb (117 kg)  11/17/21 256 lb (116.1 kg)  10/06/21 262 lb 3.2 oz (118.9 kg)      Other studies Reviewed: Additional studies/ records that were reviewed today include: TTE 02/19/16  Review of the above records today demonstrates:  - Left ventricle: The cavity size was moderately dilated. Wall   thickness was increased in a pattern of mild LVH. Systolic   function was moderately to severely reduced. The estimated   ejection fraction was in the range of 30% to 35%. Diffuse   hypokinesis. Features are consistent with a pseudonormal left   ventricular filling pattern, with concomitant abnormal relaxation   and increased filling pressure (grade 2 diastolic dysfunction). - Aortic valve: There was no stenosis. There was mild   regurgitation. - Aorta: Mildly dilated aortic root and ascending aorta. Aortic   root dimension: 39  mm (ED). Ascending aortic diameter: 42 mm (S). - Mitral valve: There was mild regurgitation. - Left atrium: The atrium was moderately dilated. - Right ventricle: The cavity size was normal. Systolic function   was normal. - Right atrium: The atrium was mildly dilated. - Pulmonary arteries: No complete TR  doppler jet so unable to   estimate PA systolic pressure. - Systemic veins: IVC not fully visualized. - Pericardium, extracardiac: A trivial pericardial effusion was   identified posterior to the heart.   ASSESSMENT AND PLAN:  1.  Chronic combined systolic and diastolic heart failure: Status post Medtronic CRT-D implanted 06/03/2016.  Currently on carvedilol.  Not able to tolerate other medications due to end-stage renal disease and hypotension.  Device functioning appropriately.  No changes at this time.  2.  Coronary artery disease: No current chest pain.  Left heart catheterization with significant disease and treated medically.  Plan per primary cardiology.  3.  Hypertension:well controlled  4.  Hyperlipidemia: Continue statin at current dose  5.  Obstructive sleep apnea: CPAP compliance encouraged     Current medicines are reviewed at length with the patient today.   The patient does not have concerns regarding his medicines.  The following changes were made today: none  Labs/ tests ordered today include:  Orders Placed This Encounter  Procedures   EKG 12-Lead     Disposition:   FU 12 months  Signed, Labrisha Wuellner Meredith Leeds, MD  09/07/2022 3:12 PM     [address] [place] [id number] [phone] (office) [phone] (fax)

## 2022-10-04 ENCOUNTER — Other Ambulatory Visit: Payer: Self-pay

## 2022-10-04 MED ORDER — CARVEDILOL 12.5 MG PO TABS: ORAL_TABLET | ORAL | 0 refills | Status: DC

## 2022-10-06 ENCOUNTER — Other Ambulatory Visit: Payer: Self-pay | Admitting: *Deleted

## 2022-10-06 MED ORDER — CARVEDILOL 12.5 MG PO TABS: ORAL_TABLET | ORAL | 0 refills | Status: DC

## 2022-10-06 NOTE — Progress Notes (Signed)
Remote ICD transmission.   

## 2022-12-02 ENCOUNTER — Ambulatory Visit (INDEPENDENT_AMBULATORY_CARE_PROVIDER_SITE_OTHER): Payer: Medicare Other

## 2022-12-02 DIAGNOSIS — I255 Ischemic cardiomyopathy: Secondary | ICD-10-CM

## 2022-12-02 LAB — CUP PACEART REMOTE DEVICE CHECK
Battery Remaining Longevity: 14 mo
Battery Voltage: 2.88 V
Brady Statistic AP VP Percent: 9.3 %
Brady Statistic AP VS Percent: 0.07 %
Brady Statistic AS VP Percent: 86.93 %
Brady Statistic AS VS Percent: 3.7 %
Brady Statistic RA Percent Paced: 9.2 %
Brady Statistic RV Percent Paced: 92.38 %
Date Time Interrogation Session: 20240620043728
HighPow Impedance: 63 Ohm
Implantable Lead Connection Status: 753985
Implantable Lead Connection Status: 753985
Implantable Lead Connection Status: 753985
Implantable Lead Implant Date: 20171212
Implantable Lead Implant Date: 20171212
Implantable Lead Implant Date: 20171212
Implantable Lead Location: 753858
Implantable Lead Location: 753859
Implantable Lead Location: 753860
Implantable Lead Model: 4298
Implantable Lead Model: 5076
Implantable Pulse Generator Implant Date: 20171212
Lead Channel Impedance Value: 132.321
Lead Channel Impedance Value: 132.321
Lead Channel Impedance Value: 143.419
Lead Channel Impedance Value: 155.455
Lead Channel Impedance Value: 155.455
Lead Channel Impedance Value: 247 Ohm
Lead Channel Impedance Value: 247 Ohm
Lead Channel Impedance Value: 285 Ohm
Lead Channel Impedance Value: 285 Ohm
Lead Channel Impedance Value: 342 Ohm
Lead Channel Impedance Value: 361 Ohm
Lead Channel Impedance Value: 361 Ohm
Lead Channel Impedance Value: 361 Ohm
Lead Channel Impedance Value: 399 Ohm
Lead Channel Impedance Value: 418 Ohm
Lead Channel Impedance Value: 513 Ohm
Lead Channel Impedance Value: 513 Ohm
Lead Channel Impedance Value: 532 Ohm
Lead Channel Pacing Threshold Amplitude: 0.5 V
Lead Channel Pacing Threshold Amplitude: 0.625 V
Lead Channel Pacing Threshold Amplitude: 0.75 V
Lead Channel Pacing Threshold Pulse Width: 0.4 ms
Lead Channel Pacing Threshold Pulse Width: 0.4 ms
Lead Channel Pacing Threshold Pulse Width: 0.4 ms
Lead Channel Sensing Intrinsic Amplitude: 3.625 mV
Lead Channel Sensing Intrinsic Amplitude: 3.625 mV
Lead Channel Sensing Intrinsic Amplitude: 8.625 mV
Lead Channel Sensing Intrinsic Amplitude: 8.625 mV
Lead Channel Setting Pacing Amplitude: 1.25 V
Lead Channel Setting Pacing Amplitude: 2 V
Lead Channel Setting Pacing Amplitude: 2.5 V
Lead Channel Setting Pacing Pulse Width: 0.4 ms
Lead Channel Setting Pacing Pulse Width: 0.4 ms
Lead Channel Setting Sensing Sensitivity: 0.3 mV
Zone Setting Status: 755011
Zone Setting Status: 755011

## 2022-12-22 NOTE — Progress Notes (Signed)
Remote ICD transmission.   

## 2023-03-03 ENCOUNTER — Ambulatory Visit (INDEPENDENT_AMBULATORY_CARE_PROVIDER_SITE_OTHER): Payer: Medicare Other

## 2023-03-03 DIAGNOSIS — I255 Ischemic cardiomyopathy: Secondary | ICD-10-CM

## 2023-03-03 LAB — CUP PACEART REMOTE DEVICE CHECK
Battery Remaining Longevity: 10 mo
Battery Voltage: 2.87 V
Brady Statistic AP VP Percent: 12.47 %
Brady Statistic AP VS Percent: 0.1 %
Brady Statistic AS VP Percent: 84.2 %
Brady Statistic AS VS Percent: 3.23 %
Brady Statistic RA Percent Paced: 12.31 %
Brady Statistic RV Percent Paced: 93.04 %
Date Time Interrogation Session: 20240919053325
HighPow Impedance: 61 Ohm
Implantable Lead Connection Status: 753985
Implantable Lead Connection Status: 753985
Implantable Lead Connection Status: 753985
Implantable Lead Implant Date: 20171212
Implantable Lead Implant Date: 20171212
Implantable Lead Implant Date: 20171212
Implantable Lead Location: 753858
Implantable Lead Location: 753859
Implantable Lead Location: 753860
Implantable Lead Model: 4298
Implantable Lead Model: 5076
Implantable Pulse Generator Implant Date: 20171212
Lead Channel Impedance Value: 132.321
Lead Channel Impedance Value: 136.276
Lead Channel Impedance Value: 136.276
Lead Channel Impedance Value: 147.097
Lead Channel Impedance Value: 152 Ohm
Lead Channel Impedance Value: 247 Ohm
Lead Channel Impedance Value: 285 Ohm
Lead Channel Impedance Value: 285 Ohm
Lead Channel Impedance Value: 304 Ohm
Lead Channel Impedance Value: 304 Ohm
Lead Channel Impedance Value: 342 Ohm
Lead Channel Impedance Value: 342 Ohm
Lead Channel Impedance Value: 361 Ohm
Lead Channel Impedance Value: 418 Ohm
Lead Channel Impedance Value: 456 Ohm
Lead Channel Impedance Value: 475 Ohm
Lead Channel Impedance Value: 532 Ohm
Lead Channel Impedance Value: 532 Ohm
Lead Channel Pacing Threshold Amplitude: 0.5 V
Lead Channel Pacing Threshold Amplitude: 0.625 V
Lead Channel Pacing Threshold Amplitude: 0.75 V
Lead Channel Pacing Threshold Pulse Width: 0.4 ms
Lead Channel Pacing Threshold Pulse Width: 0.4 ms
Lead Channel Pacing Threshold Pulse Width: 0.4 ms
Lead Channel Sensing Intrinsic Amplitude: 3.375 mV
Lead Channel Sensing Intrinsic Amplitude: 3.375 mV
Lead Channel Sensing Intrinsic Amplitude: 8 mV
Lead Channel Sensing Intrinsic Amplitude: 8 mV
Lead Channel Setting Pacing Amplitude: 1.25 V
Lead Channel Setting Pacing Amplitude: 2 V
Lead Channel Setting Pacing Amplitude: 2.5 V
Lead Channel Setting Pacing Pulse Width: 0.4 ms
Lead Channel Setting Pacing Pulse Width: 0.4 ms
Lead Channel Setting Sensing Sensitivity: 0.3 mV
Zone Setting Status: 755011
Zone Setting Status: 755011

## 2023-03-15 NOTE — Progress Notes (Signed)
Remote ICD transmission.   

## 2023-07-12 ENCOUNTER — Encounter (HOSPITAL_COMMUNITY): Payer: Self-pay | Admitting: Nephrology

## 2023-07-12 ENCOUNTER — Ambulatory Visit (HOSPITAL_COMMUNITY)
Admission: RE | Admit: 2023-07-12 | Discharge: 2023-07-12 | Disposition: A | Discharge status: Home / Self Care | Payer: Medicare Other | Attending: Nephrology | Admitting: Nephrology

## 2023-07-12 ENCOUNTER — Other Ambulatory Visit: Payer: Self-pay

## 2023-07-12 ENCOUNTER — Encounter (HOSPITAL_COMMUNITY)
Admission: RE | Disposition: A | Discharge status: Home / Self Care | Payer: Self-pay | Source: Home / Self Care | Attending: Nephrology

## 2023-07-12 DIAGNOSIS — N250 Renal osteodystrophy: Secondary | ICD-10-CM | POA: Diagnosis not present

## 2023-07-12 DIAGNOSIS — N186 End stage renal disease: Secondary | ICD-10-CM | POA: Diagnosis not present

## 2023-07-12 DIAGNOSIS — T82858A Stenosis of vascular prosthetic devices, implants and grafts, initial encounter: Secondary | ICD-10-CM | POA: Insufficient documentation

## 2023-07-12 DIAGNOSIS — Z992 Dependence on renal dialysis: Secondary | ICD-10-CM | POA: Insufficient documentation

## 2023-07-12 DIAGNOSIS — Z833 Family history of diabetes mellitus: Secondary | ICD-10-CM | POA: Diagnosis not present

## 2023-07-12 DIAGNOSIS — I132 Hypertensive heart and chronic kidney disease with heart failure and with stage 5 chronic kidney disease, or end stage renal disease: Secondary | ICD-10-CM | POA: Insufficient documentation

## 2023-07-12 DIAGNOSIS — Z8249 Family history of ischemic heart disease and other diseases of the circulatory system: Secondary | ICD-10-CM | POA: Diagnosis not present

## 2023-07-12 DIAGNOSIS — E1122 Type 2 diabetes mellitus with diabetic chronic kidney disease: Secondary | ICD-10-CM | POA: Insufficient documentation

## 2023-07-12 DIAGNOSIS — G4733 Obstructive sleep apnea (adult) (pediatric): Secondary | ICD-10-CM | POA: Insufficient documentation

## 2023-07-12 DIAGNOSIS — Z87891 Personal history of nicotine dependence: Secondary | ICD-10-CM | POA: Insufficient documentation

## 2023-07-12 DIAGNOSIS — I509 Heart failure, unspecified: Secondary | ICD-10-CM | POA: Insufficient documentation

## 2023-07-12 DIAGNOSIS — Y832 Surgical operation with anastomosis, bypass or graft as the cause of abnormal reaction of the patient, or of later complication, without mention of misadventure at the time of the procedure: Secondary | ICD-10-CM | POA: Insufficient documentation

## 2023-07-12 DIAGNOSIS — Z794 Long term (current) use of insulin: Secondary | ICD-10-CM | POA: Insufficient documentation

## 2023-07-12 HISTORY — PX: A/V FISTULAGRAM: CATH118298

## 2023-07-12 HISTORY — PX: PERIPHERAL VASCULAR BALLOON ANGIOPLASTY: CATH118281

## 2023-07-12 SURGERY — A/V FISTULAGRAM
Anesthesia: LOCAL

## 2023-07-12 MED ORDER — LIDOCAINE HCL (PF) 1 % IJ SOLN
INTRAMUSCULAR | Status: DC | PRN
  Administered 2023-07-12: 2 mL via SUBCUTANEOUS

## 2023-07-12 MED ORDER — SODIUM CHLORIDE 0.9% FLUSH: 3.0000 mL | INTRAVENOUS | Status: DC | PRN

## 2023-07-12 MED ORDER — ACETAMINOPHEN 325 MG PO TABS
650.0000 mg | ORAL_TABLET | ORAL | Status: DC | PRN
Start: 2023-07-12 — End: 2023-07-12

## 2023-07-12 MED ORDER — MIDAZOLAM HCL 2 MG/2ML IJ SOLN
INTRAMUSCULAR | Status: DC | PRN
  Administered 2023-07-12: 1 mg via INTRAVENOUS

## 2023-07-12 MED ORDER — FENTANYL CITRATE (PF) 100 MCG/2ML IJ SOLN
INTRAMUSCULAR | Status: DC | PRN
  Administered 2023-07-12: 25 ug via INTRAVENOUS

## 2023-07-12 MED ORDER — MIDAZOLAM HCL 2 MG/2ML IJ SOLN
INTRAMUSCULAR | Status: AC
  Filled 2023-07-12: qty 2

## 2023-07-12 MED ORDER — LIDOCAINE HCL (PF) 1 % IJ SOLN
INTRAMUSCULAR | Status: AC
  Filled 2023-07-12: qty 30

## 2023-07-12 MED ORDER — FENTANYL CITRATE (PF) 100 MCG/2ML IJ SOLN
INTRAMUSCULAR | Status: AC
  Filled 2023-07-12: qty 2

## 2023-07-12 MED ORDER — HEPARIN (PORCINE) IN NACL 1000-0.9 UT/500ML-% IV SOLN
INTRAVENOUS | Status: DC | PRN
  Administered 2023-07-12: 500 mL

## 2023-07-12 MED ORDER — ONDANSETRON HCL 4 MG/2ML IJ SOLN: 4.0000 mg | Freq: Four times a day (QID) | INTRAMUSCULAR | Status: DC | PRN

## 2023-07-12 MED ORDER — IODIXANOL 320 MG/ML IV SOLN
INTRAVENOUS | Status: DC | PRN
  Administered 2023-07-12: 10 mL via INTRAVENOUS
  Administered 2023-07-12: 15 mL via INTRAVENOUS

## 2023-07-12 MED ORDER — SODIUM CHLORIDE 0.9 % IV SOLN: INTRAVENOUS | Status: DC

## 2023-07-12 SURGICAL SUPPLY — 13 items
BAG SNAP BAND KOVER 36X36 (MISCELLANEOUS) ×2 IMPLANT
BALLN MUSTANG 6.0X40 75 (BALLOONS) ×2
BALLOON MUSTANG 6.0X40 75 (BALLOONS) IMPLANT
CATH ANGIO 5F BER2 65CM (CATHETERS) IMPLANT
CATH CROSS OVER TEMPO 5F (CATHETERS) IMPLANT
CATH SLIP KMP 65CM 5FR (CATHETERS) IMPLANT
COVER DOME SNAP 22 D (MISCELLANEOUS) ×2 IMPLANT
GUIDEWIRE ANGLED .035X150CM (WIRE) IMPLANT
MAT PREVALON FULL STRYKER (MISCELLANEOUS) IMPLANT
SHEATH PINNACLE R/O II 6F 4CM (SHEATH) IMPLANT
SHEATH PINNACLE R/O II 7F 4CM (SHEATH) IMPLANT
SYR MEDALLION 10ML (SYRINGE) IMPLANT
TRAY PV CATH (CUSTOM PROCEDURE TRAY) ×2 IMPLANT

## 2023-07-12 NOTE — Op Note (Signed)
Patient presents for concerns of decreasing flows in his right Cimino which was placed by Dr. Chestine Spore on May 25, 2019 with also a history of branch ligation.  Patient also has a pacemaker and central occlusion which was opened up by [facility].  Patient has also had inflow angioplasty.    On the physical exam, the fistula does not augment fully.  Summary:  1)      The patient had successful angioplasty (6 mm Mustang FE ~18 atm) of significant 70% stenosis in the inflow cephalic vein juxta site; minor arterial anastomosis stenosis  treated gently with a 6 mm Mustang FE ~10 ATM.  2)      Flows improved after outflow angioplasty. 3)      Unable to cross the 100% occluded innominate vein stent; if the patient has arm swelling neck step would be to try a femoral approach which is a lot more of a straight line.  Very difficult to get a wire to go across a fractured inflow edge of the stent that is completely occluded. 4)      This left Cimino remains amenable to future percutaneous intervention as long as it remains patent at least 3 months.  Description of procedure: The arm was prepped and draped in the usual sterile fashion. The right forearm Cimino fistula was cannulated (16109) with an 18G Angiocath needle directed in a retrograde direction in venous limb of the fistula. A guidewire was inserted and exchanged for a 6 Fr sheath. Contrast [phone]) injection via the side port of the sheath was performed. The angiogram of the fistula (09811) showed a patent outflow body of the cephalic fistula, dual upper arm drainage, arch, axillary vein, centrals.   The angled Glidewire was advanced and manipulated until the tip of the wire was in the proximal radial artery.  Arteriogram revealed a patent radial artery, 50% arterial anastomosis stenosis  and a inflow limb 70% cephalic vein stenosis. A 6 mm Mustang angioplasty balloon was then inserted over the guidewire and positioned at the cephalic vein inflow stenosis.    Venous angioplasty (91478) was carried out to 10-15 ATM with FULL effacement of the waist on the balloon at the cephalic vein lesion site and cone at the arterial anastomosis. The repeat angiogram showed 10% residual stenosis with no evidence of extravasation or dissection.    During again antegrade access with a 18-gauge Angiocath exchanging out over guidewire for 7 French sheath.  Wire was advanced all the way into the subclavian vein and central venogram performed.  100% occlusion at the level of the inflow edge of a fractured stent in the innominate vein.  We tried using a Bernstein catheter, crossover and a KMP and we were able to get the tip of the catheter approximately 2 mm from the inflow edge of the stent but unfortunately the wire just would not advance past that point because it was fractured and likely severe ingrowth.  Hemostasis: A 3-0 ethilon purse string suture was placed at the cannulation site on removal of the sheath.  Sedation: 1 mg Versed, 25 mcg Fentanyl. Sedation time: 31 minutes  Contrast. 15 mL  Monitoring: Because of the patient's comorbid conditions and sedation during the procedure, continuous EKG monitoring and O2 saturation monitoring was performed throughout the procedure by the RN. There were no abnormal arrhythmias encountered.  Complications: None  Diagnoses: I87.1 Stricture of vein  N18.6 ESRD T82.858A Stricture of access  Procedure Coding:  [phone] Cannulation and angiogram of fistula, venous angioplasty (cephalic  vein inflow)  [id number] Contrast  Recommendations:  1. Continue to cannulate the fistula with 15G needles.  2. Refer for problems with flows/swelling. 3. Remove the suture next treatment.   Discharge: The patient was discharged home in stable condition. The patient was given education regarding the care of the dialysis access AVF and specific instructions in case of any problems.

## 2023-07-12 NOTE — Discharge Instructions (Signed)

## 2023-07-12 NOTE — H&P (Addendum)
Chief Complaint: Decreased flows  Interval H&P  The patient has presented today for an angiogram/ angioplasty.  Various methods of treatment have been discussed with the patient.  After consideration of risk, benefits and other options for treatment, the patient has consented to a angiogram/ angioplasty with  possible stent placement.   Risks of angiogram with potential angioplasty and stenting if needed.contrast reaction, extravasation/ bleeding, dissection, hypotension and death were explained to the patient.  The patient's history has been reviewed and the patient has been examined, no changes in status.  Stable for angiogram/angioplasty  I have reviewed the patient's chart and labs.  Questions were answered to the patient's satisfaction.  Assessment/Plan: ESRD dialyzing at [facility] regimen with last dialysis Monday. Decreased access flows - planning on angiogram with possibly angioplasty.  Patient has a right radiocephalic fistula placed by Dr. Chestine Spore May 25, 2019.  Patient has a history of a pacemaker with central occlusion that was opened up by Dr. Chestine Spore requiring a 12x4 and 10x40 stents in the right innominate vein; he's also had h/o inflow PTA with 5mm and 6mm balloons. Renal osteodystrophy - continue binders per home regimen. Anemia - managed with ESA's and IV iron at dialysis center. HTN - resume home regimen. OSA CASHD -no signs of active ischemia.   HPI: Matthew Wood. is an 73 y.o. male with a history of a prior CVA, obstructive sleep apnea, hyperlipidemia, hypertension, coronary atherosclerotic heart disease, CHF, multinodular goiter, end-stage renal disease on dialysis Monday Wednesdays and Fridays at [facility] with last full treatment yesterday.  Patient is being referred for decreasing access flows in a right forearm radiocephalic fistula.  Patient does have a history of central vein stenosis as well as inflow stenosis.  ROS Per HPI.  Chemistry and  CBC: Creatinine  Date/Time Value Ref Range Status  02/03/2012 12:00 AM 2.2 (A) 0.6 - 1.3 mg/dL Final   Creat  Date/Time Value Ref Range Status  05/21/2016 09:04 AM 3.21 (H) 0.70 - 1.25 mg/dL Final    Comment:      For patients > or = 71 years of age: The upper reference limit for Creatinine is approximately 13% higher for people identified as African-American.     02/05/2016 02:42 PM 3.08 (H) 0.70 - 1.25 mg/dL Final    Comment:      For patients > or = 70 years of age: The upper reference limit for Creatinine is approximately 13% higher for people identified as African-American.      Creatinine, Ser  Date/Time Value Ref Range Status  07/24/2020 08:44 AM 6.70 (H) 0.61 - 1.24 mg/dL Final  46/96/2952 84:13 AM 6.70 (H) 0.61 - 1.24 mg/dL Final  24/40/1027 25:36 AM 5.90 (H) 0.61 - 1.24 mg/dL Final  64/40/3474 25:95 PM 6.03 (H) 0.61 - 1.24 mg/dL Final  63/87/5643 32:95 PM 5.87 (H) 0.61 - 1.24 mg/dL Final  18/84/1660 63:01 PM 5.13 (H) 0.61 - 1.24 mg/dL Final  60/03/9322 55:73 AM 6.30 (H) 0.61 - 1.24 mg/dL Final  22/07/5425 06:23 PM 4.43 (H) 0.61 - 1.24 mg/dL Final  76/28/3151 76:16 PM 5.10 (H) 0.61 - 1.24 mg/dL Final  07/37/1062 69:48 PM 4.80 (H) 0.61 - 1.24 mg/dL Final  54/62/7035 00:93 PM 4.01 (H) 0.61 - 1.24 mg/dL Final  81/82/9937 16:96 PM 4.37 (H) 0.61 - 1.24 mg/dL Final  78/93/8101 75:10 PM 4.18 (H) 0.61 - 1.24 mg/dL Final  25/85/2778 24:23 PM 4.70 (H) 0.61 - 1.24 mg/dL Final  53/61/4431 54:00 PM 4.61 (H) 0.61 -  1.24 mg/dL Final  40/98/1191 47:82 PM 3.63 (H) 0.61 - 1.24 mg/dL Final  95/62/1308 65:78 PM 3.78 (H) 0.61 - 1.24 mg/dL Final  46/96/2952 84:13 AM 4.23 (H) 0.61 - 1.24 mg/dL Final  24/40/1027 25:36 AM 4.32 (H) 0.61 - 1.24 mg/dL Final  64/40/3474 25:95 AM 4.07 (H) 0.61 - 1.24 mg/dL Final  63/87/5643 32:95 PM 4.16 (H) 0.61 - 1.24 mg/dL Final  18/84/1660 63:01 PM 4.20 (H) 0.61 - 1.24 mg/dL Final  60/03/9322 55:73 PM 4.70 (H) 0.76 - 1.27 mg/dL Final  22/07/5425 06:23  AM 5.32 (H) 0.76 - 1.27 mg/dL Final  76/28/3151 76:16 PM 5.11 (H) 0.76 - 1.27 mg/dL Final  07/37/1062 69:48 PM 4.17 (H) 0.76 - 1.27 mg/dL Final  54/62/7035 00:93 PM 3.85 (H) 0.76 - 1.27 mg/dL Final  81/82/9937 16:96 PM 4.64 (H) 0.76 - 1.27 mg/dL Final  78/93/8101 75:10 PM 4.49 mg/dL Final    Comment:                  No patient age and/or gender provided              Age                Male          Male           0 - 60 days        .44 - 1.19     .44 - 1.19     61 days - 11 months      .17 - 1.18     .17 - 1.18           1 -  2 years       .19 -  .42     .19 -  .42           3 -  4 years       .26 -  .51     .26 -  .51           5 -  6 years       .30 -  .59     .30 -  .59           7 -  8 years       .37 -  .62     .37 -  .62           9 - 10 years       .39 -  .70     .39 -  .70          11 - 12 years       .42 -  .75     .42 -  .75          13 - 14 years       .49 -  .90     .49 -  .90              >14 years       .76 - 1.27     .57 - 1.00   07/21/2016 11:41 AM 4.42 (H) 0.76 - 1.27 mg/dL Final  25/85/2778 24:23 PM 4.08 (H) 0.76 - 1.27 mg/dL Final  53/61/4431 54:00 PM 3.55 (H) 0.76 - 1.27 mg/dL Final  86/76/1950 93:26 AM 1.56 (H) 0.4 - 1.5 mg/dL Final  71/24/5809 98:33 AM 1.63 (H) 0.4 - 1.5 mg/dL Final  82/50/5397 67:34 PM 1.9 (H) 0.4 - 1.5 mg/dL Final  No results for input(s): "NA", "K", "CL", "CO2", "GLUCOSE", "BUN", "CREATININE", "CALCIUM", "PHOS" in the last 168 hours.  Invalid input(s): "ALB" No results for input(s): "WBC", "NEUTROABS", "HGB", "HCT", "MCV", "PLT" in the last 168 hours. Liver Function Tests: No results for input(s): "AST", "ALT", "ALKPHOS", "BILITOT", "PROT", "ALBUMIN" in the last 168 hours. No results for input(s): "LIPASE", "AMYLASE" in the last 168 hours. No results for input(s): "AMMONIA" in the last 168 hours. Cardiac Enzymes: No results for input(s): "CKTOTAL", "CKMB", "CKMBINDEX", "TROPONINI" in the last 168 hours. Iron Studies: No results for  input(s): "IRON", "TIBC", "TRANSFERRIN", "FERRITIN" in the last 72 hours. PT/INR: @LABRCNTIP (inr:5)  Xrays/Other Studies: )No results found for this or any previous visit (from the past 48 hours). No results found.  PMH:   Past Medical History:  Diagnosis Date   Acute respiratory failure (HCC) 10/04/2017   Anemia    Anemia in chronic kidney disease 09/29/2019   Arthritis of right knee 10/05/2017   CAD (coronary artery disease), native coronary artery 11/15/2013   70-80% mid RCA with marked ectasia.    Cardiorenal syndrome with renal failure 10/04/2017   Carotid aneurysm, left (HCC) 11/15/2013   CHF (congestive heart failure) (HCC)    CKD (chronic kidney disease), stage IV (HCC) 07/20/2016   CKD (chronic kidney disease), stage V (HCC) 03/27/2019   Coagulation defect, unspecified (HCC) 09/29/2019   Coronary artery disease    a. LHC 04/27/10 left main patent, 30-40% LAD, mid circumflex 30%, 80% mid RCA accounting for the appearance of an infract/peri-infract ischemia on nuclear testing, LV EF of 30-45%--> medical therapy   CVA (cerebrovascular accident) (HCC) 2011   potine; "little balance problems since" (05/25/2016)   Dyspnea    Elevated troponin 10/04/2017   End stage renal disease (HCC) 09/29/2019   Erectile dysfunction 11/15/2013   Essential hypertension 11/15/2013   Gout    Gout, unspecified 10/04/2019   Hematuria 10/05/2017   History of kidney stones    "passed them" (05/25/2016)   Hyperlipidemia    Hypertension    Iron deficiency anemia, unspecified 09/29/2019   LBBB (left bundle branch block)    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   Multinodular goiter    Obesity    OSA (obstructive sleep apnea) 11/15/2013   OSA on CPAP    PONV (postoperative nausea and vomiting)    diarrhea   Presence of permanent cardiac pacemaker    Renal insufficiency    Right upper lobe pneumonia 10/04/2017   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 OR;  Service: Vascular;  Laterality: Right;   COLONOSCOPY WITH PROPOFOL N/A 04/16/2014   Procedure: COLONOSCOPY WITH PROPOFOL;  Surgeon: Charolett Bumpers, MD;  Location: [facility];  Service: Endoscopy;  Laterality: N/A;   EP IMPLANTABLE DEVICE N/A 05/25/2016   Procedure: BiV ICD Insertion CRT-D;  Surgeon: Will Jorja Loa, MD;  Location: [facility];  Service: Cardiovascular;  Laterality: N/A;  INSERT / REPLACE / REMOVE PACEMAKER  05/25/2016   biventricular pacemaker   LAPAROSCOPIC CHOLECYSTECTOMY     PERIPHERAL VASCULAR INTERVENTION N/A 07/24/2020   Procedure: PERIPHERAL VASCULAR INTERVENTION;  Surgeon: Cephus Shelling, MD;  Location: [facility];  Service: Cardiovascular;  Laterality: N/A;  central fistula stent   REVISON OF ARTERIOVENOUS FISTULA Right 02/14/2020   Procedure: RIGHT ARM ARTERIOVENOUS FISTULA REVISON WITH SIDE BRANCH LIGATION VERSUS CONVERSION TO UPPER ARM FISTULA;  Surgeon: Cephus Shelling, MD;  Location: [facility] OR;  Service: Vascular;  Laterality: Right;   UPPER EXTREMITY VENOGRAPHY N/A 07/24/2020   Procedure: CENTRAL VENOGRAPHY;  Surgeon: Cephus Shelling, MD;  Location: [facility];  Service: Cardiovascular;  Laterality: N/A;    Allergies:  Allergies  Allergen Reactions   Cinacalcet Hcl Nausea And Vomiting    (SENSIPAR)    Medications:   Prior to Admission medications   Medication Sig Start Date End Date Taking?  Authorizing Provider  allopurinol (ZYLOPRIM) 300 MG tablet Take 300 mg by mouth daily.   Yes [provider]  aspirin EC 81 MG tablet Take 81 mg by mouth at bedtime.   Yes [provider]  AURYXIA 1 GM 210 MG(Fe) tablet Take 210-630 mg by mouth See admin instructions. Take 630 mg 3 times daily with meals and 210 mg with each snack   Yes [provider]  B Complex-C-Folic Acid (DIALYVITE TABLET) TABS Take 1 tablet by mouth daily.   Yes [provider]  carvedilol (COREG) 6.25 MG tablet Take 6.25 mg by mouth See admin instructions. Take 6.25 mg by mouth twice daily on non-dialysis days (Tues, Thurs, Sat, and Sun)   Yes [provider]  famotidine (PEPCID) 20 MG tablet Take 20 mg by mouth daily as needed (nausea). 09/22/22  Yes [provider]  HUMALOG KWIKPEN 100 UNIT/ML KiwkPen Inject 15 Units into the skin with breakfast, with lunch, and with evening meal. 09/19/17  Yes [provider]  insulin glargine (LANTUS) 100 UNIT/ML injection Inject 22 Units into the skin at bedtime.   Yes [provider]  midodrine (PROAMATINE) 10 MG tablet Take 10 mg by mouth every Monday, Wednesday, and Friday with hemodialysis.   Yes [provider]  rosuvastatin (CRESTOR) 20 MG tablet Take 20 mg by mouth at bedtime.   Yes [provider]  sevelamer carbonate (RENVELA) 800 MG tablet Take 800-2,400 mg by mouth See admin instructions. Take 2400 mg with each meal and 800 mg with each snack   Yes [provider]  NON FORMULARY CPAP: At bedtime    [provider]    Discontinued Meds:   Medications Discontinued During This Encounter  Medication Reason   b complex-vitamin c-folic acid (NEPHRO-VITE) 0.8 MG TABS tablet Dose change   carvedilol (COREG) 12.5 MG tablet Dose change   midodrine (PROAMATINE) 5 MG tablet Dose change   B Complex-C-Folic Acid (DIALYVITE PO) Duplicate   lidocaine-prilocaine (EMLA) cream Patient  Preference    Social History:  reports that he quit smoking about 13 years ago. His smoking use included cigarettes. He started smoking about 40 years ago. He has a 13.5 pack-year smoking history. He has never used smokeless tobacco. He reports current alcohol use. He reports that he does not use drugs.  Family History:   Family History  Problem Relation Age of Onset   Hypertension Mother    Breast cancer Mother    Heart attack Father    Stroke Father  Diabetes type II Father    Hypertension Sister     There were no vitals taken for this visit. GEN: NAD, A&Ox3, NCAT HEENT: No conjunctival pallor, EOMI NECK: Supple, no thyromegaly LUNGS: CTA B/L no rales, rhonchi or wheezing CV: RRR, No M/R/G ABD: SNDNT +BS  EXT: Tr lower extremity edema ACCESS: rt cimino, doesn't augment fully       Ethelene Hal, MD 07/12/2023, 12:51 PM

## 2023-10-27 NOTE — Progress Notes (Deleted)
 Cardiology Office Note:    Date:  10/27/2023   ID:  Matthew Merritts., DOB May 03, 1951, MRN [id number]  PCP:  Merl Star, MD  Cardiologist:  Mickiel Albany, MD (Inactive) Cardiology APP:  Flynn Hylan, NP (Inactive)  Electrophysiologist:  Will Cortland Ding, MD  Sleep Medicine:  Gaylyn Keas, MD { Click to update primary MD,subspecialty MD or APP then REFRESH:1}    Referring MD: Merl Star, MD   Chief Complaint: follow-up of CAD and CHF  History of Present Illness:    Matthew Chopp. is a 73 y.o. 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  Patient was previously followed by Dr. Felipe Horton. He was found to have a reduced EF in 2011 after having a stroke. Nuclear stress test at that time showed evidence of infarction with peri-infarct ischemia involving the inferior wall. He underwent a LHC in 04/2010 which showed 80% stenosis of mid RCA. Medical therapy was recommended given no anginal symptoms. EF remained down and ultimately underwent placement of CRT-D in 05/2016 for primary prevention of sudden cardiac death. Last Echo in 01-16-2021 showed LVEF of 30-35% with akinesis of the entire inferior wall and inferoseptal wall, mild LHV, and grade 1 diastolic dysfunction as well as mild AI and mild dilatation of the aortic root and ascending aorta measuring 43mm and 43mm respectively. GDMT has been limited given renal function and hypotension with dialysis requiring Midodrine.   He was last seen by Dr. Felipe Horton in 09/2021 at which time he reported drops in his BP during dialysis requiring Midodrine but no cardiac issues. He was last seen by Dr.  Lawana Pray in 07/2022 at which time he was doing well from a cardiac standpoint.  Patient presents today for follow-up. ***  CAD LHC in 04/2010 showed 80% stenosis of mid RCA and otherwise mild to moderate disease. Medical therapy was recommended at that time given no anginal symptoms.  - No chest pain.  - Continue aspirin  and statin.   Chronic HFrEF Echo showed LVEF of 30-35% with akinesis of the entire inferior wall and inferoseptal wall, mild LHV, and grade 1 diastolic dysfunction as well as mild AI and mild dilatation of the aortic root and ascending aorta measuring 43mm and 43mm respectively.  - Euvolemic on exam. - Volume status managed with dialysis. - GDMT limited by renal function and hypotension during dialysis. - Continue Coreg  6.25mg  twice daily on non-dialysis days.  - No ACEi/ARB/ARNI, MRA, or SGLT2 inhibitor given renal function and history of soft BP.   S/p CRT-D S/p Medtronic CRT-D in 05/2016 for primary prevention of sudden cardiac given cardiomyopathy. - Followed by Dr. Lawana Pray.   Hypertension History of hypertension complicated by hypotension during dialysis. - BP well controlled. *** - Continue Coreg  6.25mg  twice daily on non-dialysis days.  - Continue Midodrine 10mg  daily on dialysis days.   Hyperlipidemia Lipid panel in ***: - Continue Crestor  20mg  daily.   Type 2 Diabetes Mellitus Hemoglobin A1c *** - On Insulin  (Humalog  and Lantus ).  - Management per PCP.   ESRD On hemodialysis M/W/F.  - On Midodrine on dialysis days.  - Management per Nephrology.   EKGs/Labs/Other Studies Reviewed:  The following studies were reviewed:  Echocardiogram 01/06/2021: Impressions: 1. Technically difficult study, despite Definity  contrast, poor windows   2. Left ventricular ejection fraction, by estimation, is 30 to 35%. The  left ventricle has moderately decreased function. The left ventricle  demonstrates regional wall motion abnormalities (see scoring   diagram/findings for description). The left  ventricular internal cavity size was moderately to severely dilated. There  is mild left ventricular hypertrophy. Left ventricular diastolic  parameters are consistent with Grade I diastolic dysfunction (impaired  relaxation). There is severe akinesis of  the left ventricular, entire inferior wall and inferoseptal wall.   3. Right ventricular systolic function is normal. The right ventricular  size is normal.   4. Left atrial size was mildly dilated.   5. The mitral valve was not well visualized. No evidence of mitral valve  regurgitation.   6. The aortic valve is tricuspid. Aortic valve regurgitation is mild. No  aortic stenosis is present.   7. Aortic dilatation noted. There is mild dilatation of the aortic root,  measuring 43 mm. There is mild dilatation of the ascending aorta,  measuring 42 mm.   8. The inferior vena cava is normal in size with greater than 50%  respiratory variability, suggesting right atrial pressure of 3 mmHg.    EKG:  EKG ordered today. EKG personally reviewed and demonstrates ***.  Recent Labs: No results found for requested labs within last 365 days.  Recent Lipid Panel    Component Value Date/Time   CHOL  02/08/2010 0600    171        ATP III CLASSIFICATION:  <200     mg/dL   Desirable  045-409  mg/dL   Borderline High  >=811    mg/dL   High          TRIG 914 (H) 02/08/2010 0600   HDL 34 (L) 02/08/2010 0600   CHOLHDL 5.0 02/08/2010 0600   VLDL 50 (H) 02/08/2010 0600   LDLCALC  02/08/2010 0600    87        Total Cholesterol/HDL:CHD Risk Coronary Heart Disease Risk Table                     Men   Women  1/2 Average Risk   3.4   3.3  Average Risk       5.0   4.4  2 X Average Risk   9.6   7.1  3 X Average Risk  23.4   11.0        Use the calculated Patient Ratio above and the CHD Risk Table to determine the patient's CHD Risk.        ATP III CLASSIFICATION (LDL):  <100     mg/dL   Optimal   782-956  mg/dL   Near or Above                    Optimal  130-159  mg/dL   Borderline  213-086  mg/dL   High  >578     mg/dL   Very High    Physical Exam:    Vital Signs: There were no vitals taken for this visit.    Wt Readings from Last 3 Encounters:  07/12/23 250 lb (113.4 kg)  09/07/22 258 lb (117 kg)  11/17/21 256 lb (116.1 kg)     General: 73 y.o. male in no acute distress. HEENT: Normocephalic and atraumatic. Sclera clear.  Neck: Supple. No carotid bruits.  No JVD. Heart: *** RRR. Distinct S1 and S2. No murmurs, gallops, or rubs.  Lungs: No increased work of breathing. Clear to ausculation bilaterally. No wheezes, rhonchi, or rales.  Abdomen: Soft, non-distended, and non-tender to palpation.  Extremities: No lower extremity edema.  Radial and distal pedal pulses 2+ and equal bilaterally. Skin: Warm and dry. Neuro: No focal deficits. Psych: Normal affect. Responds appropriately.   Assessment:    No diagnosis found.  Plan:     Disposition: Follow up in ***   Signed, Casimer Clear, PA-C  10/27/2023 2:59 PM    [facility]

## 2023-11-03 ENCOUNTER — Ambulatory Visit (HOSPITAL_COMMUNITY)
Admission: RE | Admit: 2023-11-03 | Discharge: 2023-11-03 | Disposition: A | Discharge status: Home / Self Care | Source: Ambulatory Visit | Attending: Vascular Surgery | Admitting: Vascular Surgery

## 2023-11-03 ENCOUNTER — Other Ambulatory Visit: Payer: Self-pay

## 2023-11-03 ENCOUNTER — Ambulatory Visit: Admitting: Student

## 2023-11-03 ENCOUNTER — Encounter (HOSPITAL_COMMUNITY)
Admission: RE | Disposition: A | Discharge status: Home / Self Care | Payer: Self-pay | Source: Ambulatory Visit | Attending: Vascular Surgery

## 2023-11-03 DIAGNOSIS — Z992 Dependence on renal dialysis: Secondary | ICD-10-CM

## 2023-11-03 DIAGNOSIS — Y832 Surgical operation with anastomosis, bypass or graft as the cause of abnormal reaction of the patient, or of later complication, without mention of misadventure at the time of the procedure: Secondary | ICD-10-CM | POA: Insufficient documentation

## 2023-11-03 DIAGNOSIS — I132 Hypertensive heart and chronic kidney disease with heart failure and with stage 5 chronic kidney disease, or end stage renal disease: Secondary | ICD-10-CM | POA: Insufficient documentation

## 2023-11-03 DIAGNOSIS — Z794 Long term (current) use of insulin: Secondary | ICD-10-CM | POA: Diagnosis not present

## 2023-11-03 DIAGNOSIS — I509 Heart failure, unspecified: Secondary | ICD-10-CM | POA: Diagnosis not present

## 2023-11-03 DIAGNOSIS — T82510A Breakdown (mechanical) of surgically created arteriovenous fistula, initial encounter: Secondary | ICD-10-CM | POA: Diagnosis present

## 2023-11-03 DIAGNOSIS — T82898A Other specified complication of vascular prosthetic devices, implants and grafts, initial encounter: Secondary | ICD-10-CM

## 2023-11-03 DIAGNOSIS — E1122 Type 2 diabetes mellitus with diabetic chronic kidney disease: Secondary | ICD-10-CM | POA: Insufficient documentation

## 2023-11-03 DIAGNOSIS — N186 End stage renal disease: Secondary | ICD-10-CM | POA: Insufficient documentation

## 2023-11-03 DIAGNOSIS — Z87891 Personal history of nicotine dependence: Secondary | ICD-10-CM | POA: Insufficient documentation

## 2023-11-03 HISTORY — PX: A/V FISTULAGRAM: CATH118298

## 2023-11-03 SURGERY — A/V FISTULAGRAM

## 2023-11-03 MED ORDER — HEPARIN (PORCINE) IN NACL 1000-0.9 UT/500ML-% IV SOLN
INTRAVENOUS | Status: DC | PRN
  Administered 2023-11-03: 500 mL

## 2023-11-03 MED ORDER — LIDOCAINE HCL (PF) 1 % IJ SOLN
INTRAMUSCULAR | Status: DC | PRN
  Administered 2023-11-03: 2 mL via INTRADERMAL
  Administered 2023-11-03: 10 mL via INTRADERMAL

## 2023-11-03 MED ORDER — IODIXANOL 320 MG/ML IV SOLN
INTRAVENOUS | Status: DC | PRN
  Administered 2023-11-03: 20 mL

## 2023-11-03 MED ORDER — LIDOCAINE HCL (PF) 1 % IJ SOLN
INTRAMUSCULAR | Status: AC
  Filled 2023-11-03: qty 30

## 2023-11-03 SURGICAL SUPPLY — 11 items
CATH ANGIO 5F BER2 100CM (CATHETERS) ×1 IMPLANT
CATH ANGIO 5F BER2 65CM (CATHETERS) ×1 IMPLANT
CATH NAVICROSS ANGLED 90CM (MICROCATHETER) ×1 IMPLANT
GLIDEWIRE ADV .035X260CM (WIRE) ×1 IMPLANT
GLIDEWIRE ANGLED SS 035X260CM (WIRE) ×1 IMPLANT
KIT MICROPUNCTURE NIT STIFF (SHEATH) ×1 IMPLANT
MAT PREVALON FULL STRYKER (MISCELLANEOUS) ×1 IMPLANT
SHEATH PINNACLE 7F 10CM (SHEATH) ×1 IMPLANT
SHEATH PINNACLE R/O II 6F 4CM (SHEATH) ×1 IMPLANT
SHEATH PROBE COVER 6X72 (BAG) ×2 IMPLANT
TRAY PV CATH (CUSTOM PROCEDURE TRAY) ×2 IMPLANT

## 2023-11-03 NOTE — Op Note (Signed)
    Patient name: [name]. MRN: [id number] DOB: 09-30-1950 Sex: male  11/03/2023 Pre-operative Diagnosis: Decreased flow noted during HD sessions Post-operative diagnosis:  Same Surgeon:  Philipp Brawn, MD Procedure Performed:  Ultrasound-guided access of right radiocephalic fistula Ultrasound-guided access of right common femoral vein Fistulogram and central venogram Third order antegrade cannulation of right subclavian vein Third order retrograde cannulation of the right innominate vein   Indications: Mr. Matthew Wood is a 73 year old male presenting for fistulogram at request by his dialysis center as he was noted to have decreased flow during HD sessions.  He reports that he has not had any issues with dialysis and has run smoothly without having to stop early or prolonged bleeding after decannulation.  Risks and benefits of fistulogram were reviewed, expressed understanding and elected to proceed.  Findings:  Chronically occluded right innominate vein stent, well collateralized.  Widely patent right radiocephalic fistula and brachial outflow vein.   Procedure:  The patient was identified in the holding area and taken to the cath lab  The patient was then placed supine on the table and prepped and draped in the usual sterile fashion.  A time out was called.  Ultrasound was used to evaluate the right arm AV access. This was accessed under u/s guidance. An 018 wire was advanced without resistance, a micropuncture sheath was placed and fistulagram obtained which demonstrated the above findings.  This access was then upsized to a 17F short sheath over a glidewire.  A fistulogram with central venogram were then obtained which demonstrated the above findings.  Using a glide advantage wire and a bare catheter I attempted to cross the occluded stent.  I was unable to get the wire to engage the stent in an antegrade fashion therefore the right groin was then prepped and draped and the right common  femoral vein was accessed under ultrasound guidance and this was upsized to a 7 [place] sheath..  Using a long Glidewire and a bare catheter I was able to cannulate the stent although I was unable to cross the distal stent edge and into the subclavian vein despite multiple attempts with different catheters.  The 7 French sheath was then removed from the groin and manual pressure was held for hemostasis.  A figure-of-eight stitch was placed around the 6 French sheath in the fistula with 4-0 Monocryl and the sheath was removed.  Impression: Widely patent right radiocephalic fistula and brachial outflow vein.  Widely patent subclavian vein.  Chronically occluded innominate vein stent.  This was unable to be crossed from an antegrade or retrograde fashion and given that dialysis has been running without any issue and this was due to a slight decrease in his flow I elected to terminate the procedure at this time.  He was instructed to continue dialysis as normal via his fistula but if this were to have issues in the future he would likely a new access on the different extremities.   Philipp Brawn MD [facility] Office: [phone]

## 2023-11-03 NOTE — H&P (Signed)
 Hospital h&p   MRN #:  [id number]  History of Present Illness: This is a 73 y.o. male presenting for fistulogram.  He has had decreased flow during HD sessions and also may have had a possible infiltration he last underwent dialysis yesterday.  Past Medical History:  Diagnosis Date   Acute respiratory failure (HCC) 10/04/2017   Anemia    Anemia in chronic kidney disease 09/29/2019   Arthritis of right knee 10/05/2017   CAD (coronary artery disease), native coronary artery 11/15/2013   70-80% mid RCA with marked ectasia.    Cardiorenal syndrome with renal failure 10/04/2017   Carotid aneurysm, left (HCC) 11/15/2013   CHF (congestive heart failure) (HCC)    CKD (chronic kidney disease), stage IV (HCC) 07/20/2016   CKD (chronic kidney disease), stage V (HCC) 03/27/2019   Coagulation defect, unspecified (HCC) 09/29/2019   Coronary artery disease    a. LHC 04/27/10 left main patent, 30-40% LAD, mid circumflex 30%, 80% mid RCA accounting for the appearance of an infract/peri-infract ischemia on nuclear testing, LV EF of 30-45%--> medical therapy   CVA (cerebrovascular accident) (HCC) 2011   potine; "little balance problems since" (05/25/2016)   Dyspnea    Elevated troponin 10/04/2017   End stage renal disease (HCC) 09/29/2019   Erectile dysfunction 11/15/2013   Essential hypertension 11/15/2013   Gout    Gout, unspecified 10/04/2019   Hematuria 10/05/2017   History of kidney stones    "passed them" (05/25/2016)   Hyperlipidemia    Hypertension    Iron deficiency anemia, unspecified 09/29/2019   LBBB (left bundle branch block)    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   Multinodular goiter    Obesity    OSA (obstructive sleep apnea) 11/15/2013   OSA on CPAP    PONV (postoperative nausea and vomiting)    diarrhea   Presence of permanent cardiac pacemaker    Renal insufficiency    Right upper lobe pneumonia 10/04/2017   Secondary hyperparathyroidism of renal origin (HCC) 09/29/2019   Type 2 diabetes  mellitus with nephropathy (HCC) 10/05/2017   Type II diabetes mellitus (HCC)    Unspecified sequelae of cerebral infarction 10/04/2019   Wears partial dentures     Past Surgical History:  Procedure Laterality Date   A/V FISTULAGRAM Right 01/31/2020   Procedure: A/V FISTULAGRAM;  Surgeon: Young Hensen, MD;  Location: [facility] INVASIVE CV LAB;  Service: Cardiovascular;  Laterality: Right;   A/V FISTULAGRAM N/A 07/12/2023   Procedure: A/V Fistulagram;  Surgeon: Patrick Boor, MD;  Location: [facility] INVASIVE CV LAB;  Service: Cardiovascular;  Laterality: N/A;   AV FISTULA PLACEMENT Right 05/25/2019   Procedure: ARTERIOVENOUS (AV) FISTULA CREATION RIGHT ARM;  Surgeon: Young Hensen, MD;  Location: [facility] OR;  Service: Vascular;  Laterality: Right;   COLONOSCOPY WITH PROPOFOL  N/A 04/16/2014   Procedure: COLONOSCOPY WITH PROPOFOL ;  Surgeon: Garrett Kallman, MD;  Location: [facility] ENDOSCOPY;  Service: Endoscopy;  Laterality: N/A;   EP IMPLANTABLE DEVICE N/A 05/25/2016   Procedure: BiV ICD Insertion CRT-D;  Surgeon: Will Cortland Ding, MD;  Location: [facility] INVASIVE CV LAB;  Service: Cardiovascular;  Laterality: N/A;   INSERT / REPLACE / REMOVE PACEMAKER  05/25/2016   biventricular pacemaker   LAPAROSCOPIC CHOLECYSTECTOMY     PERIPHERAL VASCULAR BALLOON ANGIOPLASTY  07/12/2023   Procedure: PERIPHERAL VASCULAR BALLOON ANGIOPLASTY;  Surgeon: Patrick Boor, MD;  Location: [facility] INVASIVE CV LAB;  Service: Cardiovascular;;  60% cephalic   PERIPHERAL VASCULAR INTERVENTION N/A 07/24/2020  Procedure: PERIPHERAL VASCULAR INTERVENTION;  Surgeon: Young Hensen, MD;  Location: [facility] INVAS[facility] CV LAB;  Service: Cardiovascular;  Laterality: N/A;  central fistula stent   REVISON OF ARTERIOVENOUS FISTULA Right 02/14/2020   Procedure: RIGHT ARM ARTERIOVENOUS FISTULA REVISON WITH SIDE BRANCH LIGATION VERSUS CONVERSION TO UPPER ARM FISTULA;  Surgeon: Young Hensen, MD;  Location: [facility] OR;  Service: Vascular;  Laterality: Right;    UPPER EXTREMITY VENOGRAPHY N/A 07/24/2020   Procedure: CENTRAL VENOGRAPHY;  Surgeon: Young Hensen, MD;  Location: [facility] INVAS[facility] CV [facility];  Service: Cardiovascular;  Laterality: N/A;    Allergies  Allergen Reactions   Cinacalcet Hcl Nausea And Vomiting    (SENSIPAR)    Prior to Admission medications   Medication Sig Start Date End Date Taking? Authorizing Provider  allopurinol  (ZYLOPRIM ) 300 MG tablet Take 300 mg by mouth daily.    [provider]  aspirin  EC 81 MG tablet Take 81 mg by mouth at bedtime.    [provider]  AURYXIA 1 GM 210 MG(Fe) tablet Take 210-630 mg by mouth See admin instructions. Take 630 mg 3 times daily with meals and 210 mg with each snack    [provider]  B Complex-C-Folic Acid (DIALYVITE TABLET) TABS Take 1 tablet by mouth daily.    [provider]  carvedilol  (COREG ) 6.25 MG tablet Take 6.25 mg by mouth See admin instructions. Take 6.25 mg by mouth twice daily on non-dialysis days (Tues, Thurs, Sat, and Sun)    [provider]  famotidine (PEPCID) 20 MG tablet Take 20 mg by mouth daily as needed (nausea). 09/22/22   [provider]  HUMALOG  KWIKPEN 100 UNIT/ML KiwkPen Inject 15 Units into the skin with breakfast, with lunch, and with evening meal. 09/19/17   [provider]  insulin  glargine (LANTUS ) 100 UNIT/ML injection Inject 22 Units into the skin at bedtime.    [provider]  midodrine (PROAMATINE) 10 MG tablet Take 10 mg by mouth every Monday, Wednesday, and Friday with hemodialysis.    [provider]  NON FORMULARY CPAP: At bedtime    [provider]  rosuvastatin  (CRESTOR ) 20 MG tablet Take 20 mg by mouth at bedtime.    [provider]  sevelamer carbonate (RENVELA) 800 MG tablet Take 800-2,400 mg by mouth See admin instructions. Take 2400 mg with each meal and 800 mg with each snack    [provider]    Social History   Socioeconomic  History   Marital status: Married    Spouse name: Not on file   Number of children: 1   Years of education: Not on file   Highest education level: Not on file  Occupational History   Occupation: auto Probation officer: [employer]  Tobacco Use   Smoking status: Former    Current packs/day: 0.00    Average packs/day: 0.5 packs/day for 27.0 years (13.5 ttl pk-yrs)    Types: Cigarettes    Start date: 03/26/1983    Quit date: 03/25/2010    Years since quitting: 13.6   Smokeless tobacco: Never  Vaping Use   Vaping status: Never Used  Substance and Sexual Activity   Alcohol use: Yes    Comment: rare   Drug use: No   Sexual activity: Not on file  Other Topics Concern   Not on file  Social History Narrative   Not on file   Social Drivers of Health   Financial Resource Strain: Not on  file  Food Insecurity: Not on file  Transportation Needs: Not on file  Physical Activity: Not on file  Stress: Not on file  Social Connections: Not on file  Intimate Partner Violence: Not on file    Family History  Problem Relation Age of Onset   Hypertension Mother    Breast cancer Mother    Heart attack Father    Stroke Father    Diabetes type II Father    Hypertension Sister     ROS: Otherwise negative unless mentioned in HPI  Physical Examination  Vitals:   11/03/23 1307  BP: (!) 109/58  Pulse: 66  Resp: 14  SpO2: 99%   There is no height or weight on file to calculate BMI.  General: no acute distress Cardiac: hemodynamically stable Pulm: normal work of breathing Abdomen: non-tender, no pulsatile mass  Neuro: alert, no focal deficit Extremities: Palpable thrill in the right radiocephalic fistula approximately, unable to palpate thrill in the [facility] or upper arm.   ASSESSMENT/PLAN: This is a 73 y.o. male with a right radiocephalic fistula who underwent angioplasty of a proximal cephalic vein stenosis in January and now has been having low flows during dialysis  sessions.   -Risks and benefits of fistulogram with intervention were reviewed, he expressed understanding and elected to proceed.   Philipp Brawn MD [facility] [phone] 11/03/2023  1:36 PM

## 2023-11-04 ENCOUNTER — Encounter (HOSPITAL_COMMUNITY): Payer: Self-pay | Admitting: Vascular Surgery

## 2023-11-21 NOTE — Progress Notes (Unsigned)
 Cardiology Office Note:    Date:  11/21/2023   ID:  Matthew Merritts., DOB [birth year], MRN [id number]  PCP:  Merl Star, MD  Cardiologist:  Mickiel Albany, MD (Inactive) Cardiology APP:  Flynn Hylan, NP (Inactive)  Electrophysiologist:  Will Cortland Ding, MD  Sleep Medicine:  Gaylyn Keas, MD { Click to update primary MD,subspecialty MD or APP then REFRESH:1}    Referring MD: Merl Star, MD   Chief Complaint: follow-up of CAD and CHF   History of Present Illness:    Matthew Ybarbo. is a 73 y.o. 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  Patient was previously followed by Dr. Felipe Horton. He was found to have a reduced EF in 2011 after having a stroke. Nuclear stress test at that time showed evidence of infarction with peri-infarct ischemia involving the inferior wall. He underwent a LHC in 04/2010 which showed 80% stenosis of mid RCA. Medical therapy was recommended given no anginal symptoms. EF remained down and ultimately underwent placement of CRT-D in 05/2016 for primary prevention of sudden cardiac death. Last Echo in 01/08/2021 showed LVEF of 30-35% with akinesis of the entire inferior wall and inferoseptal wall, mild LHV, and grade 1 diastolic dysfunction as well as mild AI and mild dilatation of the aortic root and ascending aorta measuring 43mm and 43mm respectively. GDMT has been limited given renal function and hypotension with dialysis requiring Midodrine.   He was last seen by Dr. Felipe Horton in 09/2021 at which time he reported drops in his BP during dialysis requiring Midodrine but no cardiac issues. He was last seen by Dr.  Lawana Pray in 07/2022 at which time he was doing well from a cardiac standpoint.  Patient presents today for follow-up. ***  CAD LHC in 04/2010 showed 80% stenosis of mid RCA and otherwise mild to moderate disease. Medical therapy was recommended at that time given no anginal symptoms.  - No chest pain.  - Continue aspirin  and statin.   Chronic HFrEF Echo showed LVEF of 30-35% with akinesis of the entire inferior wall and inferoseptal wall, mild LHV, and grade 1 diastolic dysfunction as well as mild AI and mild dilatation of the aortic root and ascending aorta measuring 43mm and 43mm respectively.  - Euvolemic on exam. - Volume status managed with dialysis. - GDMT limited by renal function and hypotension during dialysis. - Continue Coreg  6.25mg  twice daily on non-dialysis days.  - No ACEi/ARB/ARNI, MRA, or SGLT2 inhibitor given renal function and history of soft BP.   S/p CRT-D S/p Medtronic CRT-D in 05/2016 for primary prevention of sudden cardiac given cardiomyopathy. - Followed by Dr. Lawana Pray.   Hypertension History of hypertension complicated by hypotension during dialysis. - BP well controlled. *** - Continue Coreg  6.25mg  twice daily on non-dialysis days.  - Continue Midodrine 10mg  daily on dialysis days.   Hyperlipidemia Lipid panel in ***: - Continue Crestor  20mg  daily.   Type 2 Diabetes Mellitus Hemoglobin A1c *** - On Insulin  (Humalog  and Lantus ).  - Management per PCP.   ESRD On hemodialysis M/W/F.  - On Midodrine on dialysis days.  - Management per Nephrology.   EKGs/Labs/Other Studies Reviewed:  The following studies were reviewed:  Echocardiogram 01/06/2021: Impressions: 1. Technically difficult study, despite Definity  contrast, poor windows   2. Left ventricular ejection fraction, by estimation, is 30 to 35%. The  left ventricle has moderately decreased function. The left ventricle  demonstrates regional wall motion abnormalities (see scoring   diagram/findings for description). The left  ventricular internal cavity size was moderately to severely dilated. There  is mild left ventricular hypertrophy. Left ventricular diastolic  parameters are consistent with Grade I diastolic dysfunction (impaired  relaxation). There is severe akinesis of  the left ventricular, entire inferior wall and inferoseptal wall.   3. Right ventricular systolic function is normal. The right ventricular  size is normal.   4. Left atrial size was mildly dilated.   5. The mitral valve was not well visualized. No evidence of mitral valve  regurgitation.   6. The aortic valve is tricuspid. Aortic valve regurgitation is mild. No  aortic stenosis is present.   7. Aortic dilatation noted. There is mild dilatation of the aortic root,  measuring 43 mm. There is mild dilatation of the ascending aorta,  measuring 42 mm.   8. The inferior vena cava is normal in size with greater than 50%  respiratory variability, suggesting right atrial pressure of 3 mmHg.   EKG:  EKG ordered today. EKG personally reviewed and demonstrates ***.  Recent Labs: No results found for requested labs within last 365 days.  Recent Lipid Panel    Component Value Date/Time   CHOL  02/08/2010 0600    171        ATP III CLASSIFICATION:  <200     mg/dL   Desirable  161-096  mg/dL   Borderline High  >=045    mg/dL   High          TRIG 409 (H) 02/08/2010 0600   HDL 34 (L) 02/08/2010 0600   CHOLHDL 5.0 02/08/2010 0600   VLDL 50 (H) 02/08/2010 0600   LDLCALC  02/08/2010 0600    87        Total Cholesterol/HDL:CHD Risk Coronary Heart Disease Risk Table                     Men   Women  1/2 Average Risk   3.4   3.3  Average Risk       5.0   4.4  2 X Average Risk   9.6   7.1  3 X Average Risk  23.4   11.0        Use the calculated Patient Ratio above and the CHD Risk Table to determine the patient's CHD Risk.        ATP III CLASSIFICATION (LDL):  <100     mg/dL   Optimal   811-914  mg/dL   Near or Above                    Optimal  130-159  mg/dL   Borderline  782-956  mg/dL   High  >213     mg/dL   Very High    Physical Exam:    Vital Signs: There were no vitals taken for this visit.    Wt Readings from Last 3 Encounters:  07/12/23 250 lb (113.4 kg)  09/07/22 258 lb (117 kg)  11/17/21 256 lb (116.1 kg)     General: 73 y.o. male in no acute distress. HEENT: Normocephalic and atraumatic. Sclera clear.  Neck: Supple. No carotid bruits. No  JVD. Heart: *** RRR. Distinct S1 and S2. No murmurs, gallops, or rubs.  Lungs: No increased work of breathing. Clear to ausculation bilaterally. No wheezes, rhonchi, or rales.  Abdomen: Soft, non-distended, and non-tender to palpation.  Extremities: No lower extremity edema.  Radial and distal pedal pulses 2+ and equal bilaterally. Skin: Warm and dry. Neuro: No focal deficits. Psych: Normal affect. Responds appropriately.   Assessment:    No diagnosis found.  Plan:     Disposition: Follow up in ***   Signed, Casimer Clear, PA-C  11/21/2023 9:02 AM    [facility]

## 2023-11-22 ENCOUNTER — Ambulatory Visit: Attending: Cardiology | Admitting: Cardiology

## 2023-11-22 ENCOUNTER — Encounter: Payer: Self-pay | Admitting: Cardiology

## 2023-11-22 VITALS — BP 108/62 | HR 74 | Ht 72.0 in | Wt 253.0 lb

## 2023-11-22 DIAGNOSIS — I2510 Atherosclerotic heart disease of native coronary artery without angina pectoris: Secondary | ICD-10-CM

## 2023-11-22 DIAGNOSIS — I5022 Chronic systolic (congestive) heart failure: Secondary | ICD-10-CM

## 2023-11-22 DIAGNOSIS — I10 Essential (primary) hypertension: Secondary | ICD-10-CM

## 2023-11-22 DIAGNOSIS — G4733 Obstructive sleep apnea (adult) (pediatric): Secondary | ICD-10-CM

## 2023-11-22 NOTE — Progress Notes (Signed)
  Electrophysiology Office Note:   Date:  11/22/2023  ID:  Matthew Merritts., DOB 1950/10/31, MRN [id number]  Primary Cardiologist: Mickiel Albany, MD (Inactive) Primary Heart Failure: None Electrophysiologist: Matthew Prunty Cortland Ding, MD      History of Present Illness:   Matthew Wood. is a 73 y.o. male with h/o chronic systolic heart failure, coronary artery disease, hypertension, hyperlipidemia, left bundle branch block, sleep apnea, diabetes, CVA, end-stage renal disease seen today for routine electrophysiology followup.   Since last being seen in our clinic the patient reports doing well.  He has no chest pain or shortness of breath.  He has been having issues with dialysis access.  He is having to go to [facility] to have a special procedure done.  Aside from that, he has no acute complaints..  he denies chest pain, palpitations, dyspnea, PND, orthopnea, nausea, vomiting, dizziness, syncope, edema, weight gain, or early satiety.   Review of systems complete and found to be negative unless listed in HPI.      EP Information / Studies Reviewed:    EKG is not ordered today. EKG from 09/07/2022 reviewed which showed AV paced      ICD Interrogation-  reviewed in detail today,  See PACEART report.  Device History: Medtronic BiV ICD implanted 06/03/2016 for chronic systolic heart failure History of appropriate therapy: No History of AAD therapy: No   Risk Assessment/Calculations:           Physical Exam:   VS:  There were no vitals taken for this visit.   Wt Readings from Last 3 Encounters:  07/12/23 250 lb (113.4 kg)  09/07/22 258 lb (117 kg)  11/17/21 256 lb (116.1 kg)     GEN: Well nourished, well developed in no acute distress NECK: No JVD; No carotid bruits CARDIAC: Regular rate and rhythm, no murmurs, rubs, gallops RESPIRATORY:  Clear to auscultation without rales, wheezing or rhonchi  ABDOMEN: Soft, non-tender, non-distended EXTREMITIES:  No edema; No deformity    ASSESSMENT AND PLAN:    Chronic systolic dysfunction s/p Medtronic CRT-D  euvolemic today Stable on an appropriate medical regimen Normal ICD function Sensing, threshold, impedance within normal limits Programming appropriate See Pace Art report No changes today  Device is nearing ERI.  We discussed generator change today.  Explained risks, benefits, and alternatives to generator change, including but not limited to bleeding and infection. Pt verbalized understanding and agrees to proceed.  2.  Coronary artery disease: No current chest pain.  Left heart catheterization with significant disease and treated medically.  His primary cardiologist has retired.  Matthew Wood have him follow-up with general cardiology.  3.  Hypertension: Well-controlled  4.  Hyperlipidemia: Continue statin per primary cardiology  5.  Obstructive sleep apnea: CPAP compliance encouraged  Disposition:   Follow up with EP Team as usual post procedure   Signed, Stephanie Littman Cortland Ding, MD

## 2023-11-24 ENCOUNTER — Ambulatory Visit: Attending: Student | Admitting: Student

## 2023-11-24 ENCOUNTER — Encounter: Payer: Self-pay | Admitting: Student

## 2023-11-24 VITALS — BP 96/54 | HR 78 | Ht 72.0 in | Wt 255.8 lb

## 2023-11-24 DIAGNOSIS — Z95810 Presence of automatic (implantable) cardiac defibrillator: Secondary | ICD-10-CM

## 2023-11-24 DIAGNOSIS — E118 Type 2 diabetes mellitus with unspecified complications: Secondary | ICD-10-CM

## 2023-11-24 DIAGNOSIS — I10 Essential (primary) hypertension: Secondary | ICD-10-CM | POA: Diagnosis not present

## 2023-11-24 DIAGNOSIS — Z794 Long term (current) use of insulin: Secondary | ICD-10-CM

## 2023-11-24 DIAGNOSIS — N186 End stage renal disease: Secondary | ICD-10-CM

## 2023-11-24 DIAGNOSIS — I2510 Atherosclerotic heart disease of native coronary artery without angina pectoris: Secondary | ICD-10-CM | POA: Diagnosis not present

## 2023-11-24 DIAGNOSIS — I5022 Chronic systolic (congestive) heart failure: Secondary | ICD-10-CM | POA: Diagnosis not present

## 2023-11-24 DIAGNOSIS — E785 Hyperlipidemia, unspecified: Secondary | ICD-10-CM

## 2023-11-24 MED ORDER — ATORVASTATIN CALCIUM 80 MG PO TABS: 80.0000 mg | ORAL_TABLET | Freq: Every day | ORAL | 1 refills | Status: DC

## 2023-11-24 NOTE — Patient Instructions (Addendum)
 Medication Instructions:  STOP TAKING CRESTOR   START TAKING ATORVASTATIN (LIPITOR) 80 MG BY MOUTH DAILY  *If you need a refill on your cardiac medications before your next appointment, please call your pharmacy*  Lab Work: IN 2-3 MONTHS COME BACK TO OUR LAB ON THE 1ST FLOOR OR ANY [facility] AROUND YOU. YOU MUST BE FASTING FOR THESE, (NOTHING TO EAT OR DRINK AFTER MIDNIGHT, EXCEPT BLACK COFFEE OR WATER ) LIPID PANEL AND LFTS  IF YOU SEE YOUR PRIMARY BEFORE THAT, ASK THEM TO DRAW THESE LABS AND YOU DO NOT NEED TO COME BACK TO OUR LAB.  If you have labs (blood work) drawn today and your tests are completely normal, you will receive your results only by: MyChart Message (if you have MyChart) OR A paper copy in the mail If you have any lab test that is abnormal or we need to change your treatment, we will call you to review the results.  Testing/Procedures: NONE  Follow-Up: At [facility], you and your health needs are our priority.  As part of our continuing mission to provide you with exceptional heart care, our providers are all part of one team.  This team includes your primary Cardiologist (physician) and Advanced Practice Providers or APPs (Physician Assistants and Nurse Practitioners) who all work together to provide you with the care you need, when you need it.  Your next appointment:   1 year(s)  Provider:   One of our Advanced Practice Providers (APPs): Melita Springer, PA-C  Friddie Jetty, NP Evaline Hill, NP  Theotis Flake, PA-C Lawana Pray, NP  Willis Harter, PA-C Lovette Rud, PA-C  [place], [place] Charles Connor, NP  Marlana Silvan, NP Marcie Sever, PA-C  Laquita Plant, PA-C    Dayna Dunn, PA-C  Marlyse Single, PA-C Palmer Bobo, NP Katlyn West, NP Callie Goodrich, PA-C  Evan Williams, PA-C Sheng Haley, PA-C  Xika Zhao, NP Kathleen Johnson, PA-C

## 2023-12-06 ENCOUNTER — Ambulatory Visit

## 2023-12-07 ENCOUNTER — Telehealth: Payer: Self-pay

## 2023-12-07 LAB — CUP PACEART REMOTE DEVICE CHECK
Battery Remaining Longevity: 2 mo
Battery Voltage: 2.73 V
Brady Statistic AP VP Percent: 12.75 %
Brady Statistic AP VS Percent: 0.07 %
Brady Statistic AS VP Percent: 83.93 %
Brady Statistic AS VS Percent: 3.24 %
Brady Statistic RA Percent Paced: 12.5 %
Brady Statistic RV Percent Paced: 93.73 %
Date Time Interrogation Session: 20250624134456
HighPow Impedance: 61 Ohm
Implantable Lead Connection Status: 753985
Implantable Lead Connection Status: 753985
Implantable Lead Connection Status: 753985
Implantable Lead Implant Date: 20171212
Implantable Lead Implant Date: 20171212
Implantable Lead Implant Date: 20171212
Implantable Lead Location: 753858
Implantable Lead Location: 753859
Implantable Lead Location: 753860
Implantable Lead Model: 4298
Implantable Lead Model: 5076
Implantable Pulse Generator Implant Date: 20171212
Lead Channel Impedance Value: 132.321
Lead Channel Impedance Value: 132.321
Lead Channel Impedance Value: 143.419
Lead Channel Impedance Value: 155.455
Lead Channel Impedance Value: 155.455
Lead Channel Impedance Value: 247 Ohm
Lead Channel Impedance Value: 285 Ohm
Lead Channel Impedance Value: 285 Ohm
Lead Channel Impedance Value: 285 Ohm
Lead Channel Impedance Value: 342 Ohm
Lead Channel Impedance Value: 361 Ohm
Lead Channel Impedance Value: 361 Ohm
Lead Channel Impedance Value: 418 Ohm
Lead Channel Impedance Value: 418 Ohm
Lead Channel Impedance Value: 418 Ohm
Lead Channel Impedance Value: 513 Ohm
Lead Channel Impedance Value: 513 Ohm
Lead Channel Impedance Value: 513 Ohm
Lead Channel Pacing Threshold Amplitude: 0.5 V
Lead Channel Pacing Threshold Amplitude: 0.625 V
Lead Channel Pacing Threshold Amplitude: 0.875 V
Lead Channel Pacing Threshold Pulse Width: 0.4 ms
Lead Channel Pacing Threshold Pulse Width: 0.4 ms
Lead Channel Pacing Threshold Pulse Width: 0.4 ms
Lead Channel Sensing Intrinsic Amplitude: 2.5 mV
Lead Channel Sensing Intrinsic Amplitude: 2.5 mV
Lead Channel Sensing Intrinsic Amplitude: 7.75 mV
Lead Channel Sensing Intrinsic Amplitude: 7.75 mV
Lead Channel Setting Pacing Amplitude: 1.5 V
Lead Channel Setting Pacing Amplitude: 2 V
Lead Channel Setting Pacing Amplitude: 2.5 V
Lead Channel Setting Pacing Pulse Width: 0.4 ms
Lead Channel Setting Pacing Pulse Width: 0.4 ms
Lead Channel Setting Sensing Sensitivity: 0.3 mV
Zone Setting Status: 755011
Zone Setting Status: 755011

## 2023-12-07 NOTE — Telephone Encounter (Signed)
 ICD Scheduled remote reviewed. Normal device function.  Presenting rhythm:  AS/BiV pace Battery estimated 55mo - route to triage for IFU Next remote 91 days. __________________________________________________________________________  Notified wife per DPR patient battery is nearing ERI and monthly battery checks to be performed. Once ERI is reached we will be calling to schedule a time for a generator change. Patient wife verbalized understanding and is appreciative for call.

## 2023-12-08 ENCOUNTER — Ambulatory Visit: Payer: Self-pay | Admitting: Cardiology

## 2023-12-15 ENCOUNTER — Other Ambulatory Visit: Payer: Self-pay | Admitting: Student

## 2024-01-09 ENCOUNTER — Encounter

## 2024-02-09 ENCOUNTER — Encounter

## 2024-02-15 ENCOUNTER — Telehealth: Payer: Self-pay

## 2024-02-15 DIAGNOSIS — I5022 Chronic systolic (congestive) heart failure: Secondary | ICD-10-CM

## 2024-02-15 NOTE — Telephone Encounter (Signed)
 ICD reached RRT 01/01/24.  Gen change was discussed at last apt with Dr. Inocencio on 11/22/23.

## 2024-02-16 ENCOUNTER — Ambulatory Visit (HOSPITAL_COMMUNITY): Admit: 2024-02-16 | Admitting: Surgery

## 2024-02-16 ENCOUNTER — Encounter (HOSPITAL_COMMUNITY): Payer: Self-pay

## 2024-02-16 SURGERY — A/V SHUNT INTERVENTION
Anesthesia: LOCAL | Site: Arm Lower | Laterality: Right

## 2024-02-21 NOTE — Telephone Encounter (Signed)
 Pt scheduled for BiV-ICD Gen Change with Dr. Inocencio on 9/18 at 12:30 pm. His Wife will bring him to our office on 9/16 for labs, scrub and I will meet with them to go over Instructions.

## 2024-02-22 ENCOUNTER — Telehealth (HOSPITAL_COMMUNITY): Payer: Self-pay

## 2024-02-22 NOTE — Telephone Encounter (Signed)
 Spoke with patient's wife Charmayne to discuss upcoming procedure/ DPR on file.  Confirmed patient is scheduled for a ICD generator change on Thursday, September 18 with Dr. Soyla Norton. Instructed patient to arrive at the Main Entrance A at [facility]: [address] [place], [place] [address] and check in at Admitting at 10:30 AM.   Labs: 9/16  Any recent signs of acute illness or been started on antibiotics? No  Any new medications started? No Any medications to hold? Yes HOLD: Aspirin  for 2 days prior to your procedure. Your last dose will be Monday, September 15, PM dose.  Humalog  (Insulin ): No bedtime dose the day before your procedure. The day of your procedure if your CBG is greater than 220 mg/dL, you may take 1/2 of your usual dose.  Lantus  (Insulin ): The day before your procedure only take  of your usual dinner/bedtime dose.   Medication instructions:  On the morning of your procedure you may take all other medications not discussed with a small sip of water .  No eating or drinking after midnight prior to procedure.   The night before your procedure and the morning of your procedure, wash thoroughly with the CHG surgical soap from the neck down, paying special attention to the area where your procedure will be performed.  Advised of plan to go home the same day and will only stay overnight if medically necessary. You MUST have a responsible adult to drive you home and MUST be with you the first 24 hours after you arrive home.  Informed that a nurse will call a day before the procedure to confirm arrival time and ensure instructions are followed.  Patient's wife verbalized understanding to all instructions provided and agreed to proceed with procedure.

## 2024-02-29 ENCOUNTER — Encounter (HOSPITAL_COMMUNITY): Payer: Self-pay

## 2024-02-29 LAB — BASIC METABOLIC PANEL WITH GFR
BUN/Creatinine Ratio: 4 — AB (ref 10–24)
BUN: 36 mg/dL — AB (ref 8–27)
CO2: 26 mmol/L (ref 20–29)
Calcium: 9.5 mg/dL (ref 8.6–10.2)
Chloride: 94 mmol/L — AB (ref 96–106)
Creatinine, Ser: 9.83 mg/dL — AB (ref 0.76–1.27)
Glucose: 130 mg/dL — AB (ref 70–99)
Potassium: 3.9 mmol/L (ref 3.5–5.2)
Sodium: 138 mmol/L (ref 134–144)
eGFR: 5 mL/min/1.73 — AB (ref >59)

## 2024-02-29 LAB — CBC
Hematocrit: 34.6 % — ABNORMAL LOW (ref 37.5–51.0)
Hemoglobin: 11 g/dL — ABNORMAL LOW (ref 13.0–17.7)
MCH: 30 pg (ref 26.6–33.0)
MCHC: 31.8 g/dL (ref 31.5–35.7)
MCV: 94 fL (ref 79–97)
Platelets: 245 x10E3/uL (ref 150–450)
RBC: 3.67 x10E6/uL — ABNORMAL LOW (ref 4.14–5.80)
RDW: 15.5 % — ABNORMAL HIGH (ref 11.6–15.4)
WBC: 8.6 x10E3/uL (ref 3.4–10.8)

## 2024-02-29 NOTE — Pre-Procedure Instructions (Signed)
 Spoke with patients spouse Charmayne.  Instructed patient on the following items: Arrival time 1230, new arrival time Nothing to eat or drink after midnight No meds AM of procedure Responsible person to drive you home and stay with you for 24 hrs Wash with special soap night before and morning of procedure

## 2024-03-01 ENCOUNTER — Encounter (HOSPITAL_COMMUNITY)
Admission: RE | Disposition: A | Discharge status: Home / Self Care | Payer: Self-pay | Source: Home / Self Care | Attending: Cardiology

## 2024-03-01 ENCOUNTER — Ambulatory Visit (HOSPITAL_COMMUNITY)
Admission: RE | Admit: 2024-03-01 | Discharge: 2024-03-01 | Disposition: A | Discharge status: Home / Self Care | Attending: Cardiology | Admitting: Cardiology

## 2024-03-01 ENCOUNTER — Other Ambulatory Visit: Payer: Self-pay

## 2024-03-01 DIAGNOSIS — Z4502 Encounter for adjustment and management of automatic implantable cardiac defibrillator: Secondary | ICD-10-CM | POA: Diagnosis present

## 2024-03-01 DIAGNOSIS — I132 Hypertensive heart and chronic kidney disease with heart failure and with stage 5 chronic kidney disease, or end stage renal disease: Secondary | ICD-10-CM | POA: Diagnosis not present

## 2024-03-01 DIAGNOSIS — E1122 Type 2 diabetes mellitus with diabetic chronic kidney disease: Secondary | ICD-10-CM | POA: Diagnosis not present

## 2024-03-01 DIAGNOSIS — N186 End stage renal disease: Secondary | ICD-10-CM | POA: Insufficient documentation

## 2024-03-01 DIAGNOSIS — E785 Hyperlipidemia, unspecified: Secondary | ICD-10-CM | POA: Diagnosis not present

## 2024-03-01 DIAGNOSIS — Z8673 Personal history of transient ischemic attack (TIA), and cerebral infarction without residual deficits: Secondary | ICD-10-CM | POA: Diagnosis not present

## 2024-03-01 DIAGNOSIS — I255 Ischemic cardiomyopathy: Secondary | ICD-10-CM

## 2024-03-01 DIAGNOSIS — G4730 Sleep apnea, unspecified: Secondary | ICD-10-CM | POA: Insufficient documentation

## 2024-03-01 DIAGNOSIS — I2510 Atherosclerotic heart disease of native coronary artery without angina pectoris: Secondary | ICD-10-CM | POA: Diagnosis not present

## 2024-03-01 DIAGNOSIS — I447 Left bundle-branch block, unspecified: Secondary | ICD-10-CM | POA: Insufficient documentation

## 2024-03-01 DIAGNOSIS — I5022 Chronic systolic (congestive) heart failure: Secondary | ICD-10-CM | POA: Diagnosis not present

## 2024-03-01 HISTORY — PX: BIV ICD GENERATOR CHANGEOUT: EP1194

## 2024-03-01 LAB — GLUCOSE, CAPILLARY: Glucose-Capillary: 154 mg/dL — ABNORMAL HIGH (ref 70–99)

## 2024-03-01 SURGERY — BIV ICD GENERATOR CHANGEOUT

## 2024-03-01 MED ORDER — ONDANSETRON HCL 4 MG/2ML IJ SOLN: 4.0000 mg | Freq: Four times a day (QID) | INTRAMUSCULAR | Status: DC | PRN

## 2024-03-01 MED ORDER — SODIUM CHLORIDE 0.9 % IV SOLN: INTRAVENOUS | Status: DC

## 2024-03-01 MED ORDER — CEFAZOLIN SODIUM-DEXTROSE 2-4 GM/100ML-% IV SOLN: 2.0000 g | INTRAVENOUS | Status: DC

## 2024-03-01 MED ORDER — SODIUM CHLORIDE 0.9 % IV SOLN: 80.0000 mg | INTRAVENOUS | Status: DC

## 2024-03-01 MED ORDER — SODIUM CHLORIDE 0.9 % IV SOLN
INTRAVENOUS | Status: AC
  Filled 2024-03-01: qty 2

## 2024-03-01 MED ORDER — LIDOCAINE HCL (PF) 1 % IJ SOLN
INTRAMUSCULAR | Status: DC | PRN
  Administered 2024-03-01: 60 mL

## 2024-03-01 MED ORDER — CEFAZOLIN SODIUM-DEXTROSE 2-4 GM/100ML-% IV SOLN
INTRAVENOUS | Status: AC
  Filled 2024-03-01: qty 100

## 2024-03-01 MED ORDER — SODIUM CHLORIDE 0.9 % IV SOLN
INTRAVENOUS | Status: DC | PRN
  Administered 2024-03-01: 80 mg

## 2024-03-01 MED ORDER — CHLORHEXIDINE GLUCONATE 4 % EX SOLN
4.0000 | Freq: Once | CUTANEOUS | Status: DC
  Filled 2024-03-01: qty 60

## 2024-03-01 MED ORDER — POVIDONE-IODINE 10 % EX SWAB: 2.0000 | Freq: Once | CUTANEOUS | Status: DC

## 2024-03-01 MED ORDER — CEFAZOLIN SODIUM-DEXTROSE 2-3 GM-%(50ML) IV SOLR
INTRAVENOUS | Status: DC | PRN
  Administered 2024-03-01: 2 g via INTRAVENOUS

## 2024-03-01 MED ORDER — ACETAMINOPHEN 325 MG PO TABS: 325.0000 mg | ORAL_TABLET | ORAL | Status: DC | PRN

## 2024-03-01 SURGICAL SUPPLY — 5 items
CABLE SURGICAL S-101-97-12 (CABLE) ×1 IMPLANT
ICD COBALT XT QUAD CRT DTPA2QQ (ICD Generator) IMPLANT
PAD DEFIB RADIO PHYSIO CONN (PAD) ×1 IMPLANT
POUCH AIGIS-R ANTIBACT ICD LRG (Mesh General) IMPLANT
TRAY PACEMAKER INSERTION (PACKS) ×1 IMPLANT

## 2024-03-01 NOTE — Progress Notes (Signed)
 Discharge instructions reviewed with patient, and sent home to be reviewed by his wife Chamaine. Denies questions or concerns. PT was able to ambulate without difficulty. PT was not able to void d/t baseline condition. PT escorted from unit via wheel chair to personal vehicle.

## 2024-03-01 NOTE — H&P (Signed)
  Electrophysiology Office Note:   Date:  03/01/2024  ID:  Elsie JINNY Quin Mickey., DOB 09-19-1950, MRN [id number]  Primary Cardiologist: Oneil Parchment, MD Primary Heart Failure: None Electrophysiologist: Tambra Muller Gladis Norton, MD      History of Present Illness:   Kiondre Grenz. is a 73 y.o. male with h/o chronic systolic heart failure, coronary artery disease, hypertension, hyperlipidemia, left bundle branch block, sleep apnea, diabetes, CVA, end-stage renal disease seen today for routine electrophysiology followup.   Today, denies symptoms of palpitations, chest pain, dyspnea, orthopnea, PND, lower extremity edema, claudication, dizziness, presyncope, syncope, bleeding, or neurologic sequela. The patient is tolerating medications without difficulties. Plan gen change today.     EP Information / Studies Reviewed:    EKG is not ordered today. EKG from 09/07/2022 reviewed which showed AV paced      ICD Interrogation-  reviewed in detail today,  See PACEART report.  Device History: Medtronic BiV ICD implanted 06/03/2016 for chronic systolic heart failure History of appropriate therapy: No History of AAD therapy: No   Risk Assessment/Calculations:           Physical Exam:   VS:  BP 133/69   Pulse 72   Temp 98.6 F (37 C) (Oral)   Resp 17   Ht 6' (1.829 m)   Wt 115.7 kg   SpO2 99%   BMI 34.58 kg/m    Wt Readings from Last 3 Encounters:  03/01/24 115.7 kg  11/24/23 116 kg  11/22/23 114.8 kg    GEN: Well nourished, well developed in no acute distress NECK: No JVD; No carotid bruits CARDIAC: Regular rate and rhythm, no murmurs, rubs, gallops RESPIRATORY:  Clear to auscultation without rales, wheezing or rhonchi  ABDOMEN: Soft, non-tender, non-distended EXTREMITIES:  No edema; No deformity    ASSESSMENT AND PLAN:    Chronic systolic dysfunction s/p Medtronic CRT-D  Generator change planned today.  Explained risks, benefits, and alternatives to generator change,  including but not limited to bleeding and infection. Pt verbalized understanding and agrees to proceed.

## 2024-03-01 NOTE — Discharge Instructions (Signed)

## 2024-03-02 ENCOUNTER — Encounter (HOSPITAL_COMMUNITY): Payer: Self-pay | Admitting: Cardiology

## 2024-03-06 ENCOUNTER — Encounter

## 2024-03-12 ENCOUNTER — Encounter

## 2024-03-15 ENCOUNTER — Ambulatory Visit: Attending: Internal Medicine

## 2024-03-15 DIAGNOSIS — I5022 Chronic systolic (congestive) heart failure: Secondary | ICD-10-CM | POA: Diagnosis not present

## 2024-03-15 NOTE — Patient Instructions (Addendum)
   After Your ICD (Implantable Cardiac Defibrillator)    Monitor your defibrillator site for redness, swelling, and drainage. Call the device clinic at [phone] if you experience these symptoms or fever/chills.  Your incision was closed with Steri-strips or staples:  You may shower 7 days after your procedure and wash your incision with soap and water. Avoid lotions, ointments, or perfumes over your incision until it is well-healed.  You may use a hot tub or a pool after your wound check appointment if the incision is completely closed.   Your ICD is designed to protect you from life threatening heart rhythms. Because of this, you may receive a shock.   1 shock with no symptoms:  Call the office during business hours. 1 shock with symptoms (chest pain, chest pressure, dizziness, lightheadedness, shortness of breath, overall feeling unwell):  Call 911. If you experience 2 or more shocks in 24 hours:  Call 911. If you receive a shock, you should not drive.  [place] DMV - no driving for 6 months if you receive appropriate therapy from your ICD.   ICD Alerts:  Some alerts are vibratory and others beep. These are NOT emergencies. Please call our office to let us know. If this occurs at night or on weekends, it can wait until the next business day. Send a remote transmission.  If your device is capable of reading fluid status (for heart failure), you will be offered monthly monitoring to review this with you.   Remote monitoring is used to monitor your ICD from home. This monitoring is scheduled every 91 days by our office. It allows us to keep an eye on the functioning of your device to ensure it is working properly. You will routinely see your Electrophysiologist annually (more often if necessary).  

## 2024-03-15 NOTE — Progress Notes (Signed)
 Normal CRT-D chamber ICD wound check. Wound well healed. Presenting rhythm: AS/BVP w/ PVC's. Routine testing performed. Thresholds, sensing, and impedance consistent with implant measurements . No treated arrhythmias.  Reviewed shock plan.  Pt enrolled in remote follow-up.

## 2024-03-30 NOTE — Progress Notes (Signed)
 Matthew Wood.                                          MRN: [id number]   03/30/2024   The [facility]BC[facility] Quality Team Specialist reviewed this patient medical record for the purposes of chart review for care gap closure. The following were reviewed: chart review for care gap closure-colorectal cancer screening, diabetic eye exam, and glycemic status assessment.    VBCI Quality Team

## 2024-04-03 ENCOUNTER — Encounter (HOSPITAL_COMMUNITY): Payer: Self-pay

## 2024-04-06 ENCOUNTER — Encounter (HOSPITAL_COMMUNITY): Payer: Self-pay | Admitting: Surgery

## 2024-04-06 ENCOUNTER — Ambulatory Visit (HOSPITAL_COMMUNITY)
Admission: RE | Admit: 2024-04-06 | Discharge: 2024-04-06 | Disposition: A | Discharge status: Home / Self Care | Attending: Surgery | Admitting: Surgery

## 2024-04-06 ENCOUNTER — Encounter (HOSPITAL_COMMUNITY)
Admission: RE | Disposition: A | Discharge status: Home / Self Care | Payer: Self-pay | Source: Home / Self Care | Attending: Surgery

## 2024-04-06 ENCOUNTER — Other Ambulatory Visit: Payer: Self-pay

## 2024-04-06 DIAGNOSIS — N186 End stage renal disease: Secondary | ICD-10-CM | POA: Insufficient documentation

## 2024-04-06 DIAGNOSIS — Z87891 Personal history of nicotine dependence: Secondary | ICD-10-CM | POA: Insufficient documentation

## 2024-04-06 DIAGNOSIS — Z992 Dependence on renal dialysis: Secondary | ICD-10-CM | POA: Diagnosis not present

## 2024-04-06 DIAGNOSIS — I509 Heart failure, unspecified: Secondary | ICD-10-CM | POA: Diagnosis not present

## 2024-04-06 DIAGNOSIS — I132 Hypertensive heart and chronic kidney disease with heart failure and with stage 5 chronic kidney disease, or end stage renal disease: Secondary | ICD-10-CM | POA: Insufficient documentation

## 2024-04-06 DIAGNOSIS — T82858A Stenosis of vascular prosthetic devices, implants and grafts, initial encounter: Secondary | ICD-10-CM | POA: Insufficient documentation

## 2024-04-06 DIAGNOSIS — T82868A Thrombosis of vascular prosthetic devices, implants and grafts, initial encounter: Secondary | ICD-10-CM

## 2024-04-06 DIAGNOSIS — T82898A Other specified complication of vascular prosthetic devices, implants and grafts, initial encounter: Secondary | ICD-10-CM | POA: Diagnosis not present

## 2024-04-06 DIAGNOSIS — E1122 Type 2 diabetes mellitus with diabetic chronic kidney disease: Secondary | ICD-10-CM | POA: Insufficient documentation

## 2024-04-06 DIAGNOSIS — Y832 Surgical operation with anastomosis, bypass or graft as the cause of abnormal reaction of the patient, or of later complication, without mention of misadventure at the time of the procedure: Secondary | ICD-10-CM | POA: Insufficient documentation

## 2024-04-06 HISTORY — PX: VENOUS ANGIOPLASTY: CATH118376

## 2024-04-06 HISTORY — PX: A/V FISTULAGRAM: CATH118298

## 2024-04-06 LAB — GLUCOSE, CAPILLARY: Glucose-Capillary: 118 mg/dL — ABNORMAL HIGH (ref 70–99)

## 2024-04-06 SURGERY — A/V FISTULAGRAM
Anesthesia: LOCAL | Site: Arm Lower | Laterality: Right

## 2024-04-06 MED ORDER — LIDOCAINE HCL (PF) 1 % IJ SOLN
INTRAMUSCULAR | Status: DC | PRN
  Administered 2024-04-06: 5 mL

## 2024-04-06 MED ORDER — IODIXANOL 320 MG/ML IV SOLN
INTRAVENOUS | Status: DC | PRN
  Administered 2024-04-06: 50 mL via INTRAVENOUS

## 2024-04-06 MED ORDER — HEPARIN (PORCINE) IN NACL 1000-0.9 UT/500ML-% IV SOLN
INTRAVENOUS | Status: DC | PRN
  Administered 2024-04-06: 500 mL

## 2024-04-06 SURGICAL SUPPLY — 13 items
BALLOON MUSTANG 3X40X75 (BALLOONS) IMPLANT
BALLOON MUSTANG 4.0X40 75 (BALLOONS) IMPLANT
BALLOON MUSTANG 5X80X75 (BALLOONS) IMPLANT
DEVICE INFLATION ENCORE 26 (MISCELLANEOUS) IMPLANT
GLIDEWIRE ADV .035X180CM (WIRE) IMPLANT
KIT MICROPUNCTURE NIT STIFF (SHEATH) IMPLANT
KIT PV (KITS) ×2 IMPLANT
MAT PREVALON FULL STRYKER (MISCELLANEOUS) IMPLANT
SHEATH PINNACLE R/O II 6F 4CM (SHEATH) IMPLANT
SHEATH PROBE COVER 6X72 (BAG) IMPLANT
TRAY PV CATH (CUSTOM PROCEDURE TRAY) ×2 IMPLANT
TUBING CIL FLEX 10 FLL-RA (TUBING) IMPLANT
WIRE G V18X300CM (WIRE) IMPLANT

## 2024-04-06 NOTE — H&P (Signed)
 [facility] [place]  Patient name: Matthew Wood. MRN: [id number] DOB: 09-23-1950 Sex: male   REASON FOR VISIT:    Dialysis access complication  HISOTRY OF PRESENT ILLNESS:    Matthew Wood. is a 73 y.o. male with a history of a right radiocephalic fistula placed in 2020.  He has also undergone branch ligation.  He has a known occluded innominate vein stent which has been unable to be recanalized.  He is having issues with bleeding after dialysis and pulling clots.  In January of this year he underwent angioplasty of the inflow vein and arterial venous anastomosis.  He he has also undergone a diagnostic fistulogram since that time.   PAST MEDICAL HISTORY:   Past Medical History:  Diagnosis Date   Acute respiratory failure (HCC) 10/04/2017   Anemia    Anemia in chronic kidney disease 09/29/2019   Arthritis of right knee 10/05/2017   CAD (coronary artery disease), native coronary artery 11/15/2013   70-80% mid RCA with marked ectasia.    Cardiorenal syndrome with renal failure 10/04/2017   Carotid aneurysm, left 11/15/2013   CHF (congestive heart failure) (HCC)    CKD (chronic kidney disease), stage IV (HCC) 07/20/2016   CKD (chronic kidney disease), stage V (HCC) 03/27/2019   Coagulation defect, unspecified 09/29/2019   Coronary artery disease    a. LHC 04/27/10 left main patent, 30-40% LAD, mid circumflex 30%, 80% mid RCA accounting for the appearance of an infract/peri-infract ischemia on nuclear testing, LV EF of 30-45%--> medical therapy   CVA (cerebrovascular accident) (HCC) 2011   potine; little balance problems since (05/25/2016)   Dyspnea    Elevated troponin 10/04/2017   End stage renal disease (HCC) 09/29/2019   Erectile dysfunction 11/15/2013   Essential hypertension 11/15/2013   Gout    Gout, unspecified 10/04/2019   Hematuria 10/05/2017   History of kidney stones    passed them (05/25/2016)    Hyperlipidemia    Hypertension    Iron deficiency anemia, unspecified 09/29/2019   LBBB (left bundle branch block)    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   Multinodular goiter    Obesity    OSA (obstructive sleep apnea) 11/15/2013   OSA on CPAP    PONV (postoperative nausea and vomiting)    diarrhea   Presence of permanent cardiac pacemaker    Renal insufficiency    Right upper lobe pneumonia 10/04/2017   Secondary hyperparathyroidism of renal origin 09/29/2019   Type 2 diabetes mellitus with nephropathy (HCC) 10/05/2017   Type II diabetes mellitus (HCC)    Unspecified sequelae of cerebral infarction 10/04/2019   Wears partial dentures      FAMILY HISTORY:   Family History  Problem Relation Age of Onset   Hypertension Mother    Breast cancer Mother    Heart attack Father    Stroke Father    Diabetes type II Father    Hypertension Sister     SOCIAL HISTORY:   Social History   Tobacco Use   Smoking status: Former    Current packs/day: 0.00    Average packs/day: 0.5 packs/day for 27.0 years (13.5 ttl pk-yrs)    Types: Cigarettes    Start date: 03/26/1983    Quit date: 03/25/2010    Years since quitting: 14.0   Smokeless tobacco: Never  Substance Use Topics   Alcohol use: Yes    Comment: rare     ALLERGIES:   Allergies  Allergen Reactions  Cinacalcet Hcl Nausea And Vomiting    (SENSIPAR)     CURRENT MEDICATIONS:   No current facility-administered medications for this encounter.    REVIEW OF SYSTEMS:   [X]  denotes positive finding, [ ]  denotes negative finding Cardiac  Comments:  Chest pain or chest pressure:    Shortness of breath upon exertion:    Short of breath when lying flat:    Irregular heart rhythm:        Vascular    Pain in calf, thigh, or hip brought on by ambulation:    Pain in feet at night that wakes you up from your sleep:     Blood clot in your veins:    Leg swelling:         Pulmonary    Oxygen at home:    Productive cough:      Wheezing:         Neurologic    Sudden weakness in arms or legs:     Sudden numbness in arms or legs:     Sudden onset of difficulty speaking or slurred speech:    Temporary loss of vision in one eye:     Problems with dizziness:         Gastrointestinal    Blood in stool:     Vomited blood:         Genitourinary    Burning when urinating:     Blood in urine:        Psychiatric    Major depression:         Hematologic    Bleeding problems:    Problems with blood clotting too easily:        Skin    Rashes or ulcers:        Constitutional    Fever or chills:      PHYSICAL EXAM:   Vitals:   04/06/24 0704  BP: 122/78  Pulse: 80  SpO2: 94%    GENERAL: The patient is a well-nourished male, in no acute distress. The vital signs are documented above. CARDIAC: There is a regular rate and rhythm.  VASCULAR: Palpable thrill in fistula PULMONARY: Non-labored respirations ABDOMEN: Soft and non-tender with normal pitched bowel sounds.  MUSCULOSKELETAL: There are no major deformities or cyanosis. NEUROLOGIC: No focal weakness or paresthesias are detected. SKIN: There are no ulcers or rashes noted. PSYCHIATRIC: The patient has a normal affect.  STUDIES:     MEDICAL ISSUES:   ESRD: Patient is having trouble with access of his fistula.  They are pulling clots and he has had trouble with bleeding when the needles are removed.  Therefore I have recommended proceeding with a fistulogram to better evaluate his access.  We talked about angioplasty to treat any stenosis.  All questions answered.    Malvina Serene CLORE, MD, FACS [facility] [phone] Pager [phone]

## 2024-04-06 NOTE — Op Note (Signed)
    Patient name: Matthew Wood. MRN: [id number] DOB: 1950/07/31 Sex: male  04/06/2024 Pre-operative Diagnosis: ESRD Post-operative diagnosis:  Same Surgeon:  Malvina New Procedure Performed:  1.  Ultrasound-guided access, right cephalic vein  2.  Fistulogram  3.  Angioplasty, cephalic vein (peripheral)   Indications: This is a 73 year old gentleman with issues with dialysis cannulation.  He has a known occluded innominate vein stent.  He is here for fistulogram.  Procedure:  The patient was identified in the holding area and taken to room 8.  The patient was then placed supine on the table and prepped and draped in the usual sterile fashion.  A time out was called.  Ultrasound was used to evaluate the fistula.  The vein was patent and compressible.  A digital ultrasound image was acquired.  The fistula was then accessed under ultrasound guidance using a micropuncture needle.  An 018 wire was then asvanced without resistance and a micropuncture sheath was placed.  Contrast injections were then performed through the sheath.  Findings: Known innominate vein stent occlusion with good collateralization.  Cephalic vein in the upper arm is widely patent.  No obvious stenosis in the proximal forearm.  There is aneurysmal change.  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  At the level of the arteriovenous anastomosis, there is a stenosis, greater than 70%.   Intervention: After the above images were acquired the decision was made to proceed with intervention.  Over a Glidewire advantage, a 6 [place] sheath was placed angled towards the wrist.  I had difficulty getting the Glidewire advantage to cross the anastomosis and so I switched over to a V-18 wire which I was able to navigate into the distal radial artery.  I then selected a 3, 4, and 5 balloon to treat the proximal fistula.  Completion imaging showed near resolution of the stenosis, now less than 20%.  The sheath was removed and  access site closed with a monitor  Impression:  #1  Known innominate vein stenosis  #2  Successful venoplasty of the proximal cephalic vein stenosis using a 4, and 5 balloon  #3  Access remains amenable to future interventions   V. Malvina New, M.D., [facility] [place] [facility] [place] Office: [phone] Pager:  [phone]

## 2024-04-12 ENCOUNTER — Encounter

## 2024-04-20 ENCOUNTER — Ambulatory Visit (INDEPENDENT_AMBULATORY_CARE_PROVIDER_SITE_OTHER)

## 2024-04-20 DIAGNOSIS — I255 Ischemic cardiomyopathy: Secondary | ICD-10-CM

## 2024-04-22 LAB — CUP PACEART REMOTE DEVICE CHECK
Battery Remaining Longevity: 105 mo
Battery Voltage: 3.05 V
Brady Statistic AP VP Percent: 22.82 %
Brady Statistic AP VS Percent: 0.18 %
Brady Statistic AS VP Percent: 59.29 %
Brady Statistic AS VS Percent: 17.7 %
Brady Statistic RA Percent Paced: 28.48 %
Brady Statistic RV Percent Paced: 82.05 %
Date Time Interrogation Session: 20251107021841
HighPow Impedance: 54 Ohm
Implantable Lead Connection Status: 753985
Implantable Lead Connection Status: 753985
Implantable Lead Connection Status: 753985
Implantable Lead Implant Date: 20171212
Implantable Lead Implant Date: 20171212
Implantable Lead Implant Date: 20171212
Implantable Lead Location: 753858
Implantable Lead Location: 753859
Implantable Lead Location: 753860
Implantable Lead Model: 4298
Implantable Lead Model: 5076
Implantable Pulse Generator Implant Date: 20250918
Lead Channel Impedance Value: 228 Ohm
Lead Channel Impedance Value: 247 Ohm
Lead Channel Impedance Value: 247 Ohm
Lead Channel Impedance Value: 285 Ohm
Lead Channel Impedance Value: 323 Ohm
Lead Channel Impedance Value: 342 Ohm
Lead Channel Impedance Value: 342 Ohm
Lead Channel Impedance Value: 361 Ohm
Lead Channel Impedance Value: 399 Ohm
Lead Channel Impedance Value: 456 Ohm
Lead Channel Impedance Value: 494 Ohm
Lead Channel Impedance Value: 494 Ohm
Lead Channel Impedance Value: 551 Ohm
Lead Channel Pacing Threshold Amplitude: 0.625 V
Lead Channel Pacing Threshold Amplitude: 0.75 V
Lead Channel Pacing Threshold Amplitude: 1 V
Lead Channel Pacing Threshold Pulse Width: 0.4 ms
Lead Channel Pacing Threshold Pulse Width: 0.4 ms
Lead Channel Pacing Threshold Pulse Width: 0.4 ms
Lead Channel Sensing Intrinsic Amplitude: 3.3 mV
Lead Channel Sensing Intrinsic Amplitude: 8.4 mV
Lead Channel Setting Pacing Amplitude: 1.5 V
Lead Channel Setting Pacing Amplitude: 1.5 V
Lead Channel Setting Pacing Amplitude: 2 V
Lead Channel Setting Pacing Pulse Width: 0.4 ms
Lead Channel Setting Pacing Pulse Width: 0.4 ms
Lead Channel Setting Sensing Sensitivity: 0.3 mV
Zone Setting Status: 755011
Zone Setting Status: 755011
Zone Setting Status: 755011

## 2024-04-24 NOTE — Progress Notes (Signed)
 Remote ICD Transmission

## 2024-05-01 ENCOUNTER — Encounter (HOSPITAL_COMMUNITY): Payer: Self-pay

## 2024-05-02 ENCOUNTER — Ambulatory Visit: Payer: Self-pay | Admitting: Cardiology

## 2024-05-03 ENCOUNTER — Other Ambulatory Visit: Payer: Self-pay

## 2024-05-03 ENCOUNTER — Encounter (HOSPITAL_COMMUNITY): Payer: Self-pay | Admitting: Vascular Surgery

## 2024-05-03 ENCOUNTER — Ambulatory Visit (HOSPITAL_COMMUNITY)
Admission: RE | Admit: 2024-05-03 | Discharge: 2024-05-03 | Disposition: A | Discharge status: Home / Self Care | Attending: Vascular Surgery | Admitting: Vascular Surgery

## 2024-05-03 ENCOUNTER — Encounter (HOSPITAL_COMMUNITY)
Admission: RE | Disposition: A | Discharge status: Home / Self Care | Payer: Self-pay | Source: Home / Self Care | Attending: Vascular Surgery

## 2024-05-03 DIAGNOSIS — I132 Hypertensive heart and chronic kidney disease with heart failure and with stage 5 chronic kidney disease, or end stage renal disease: Secondary | ICD-10-CM | POA: Insufficient documentation

## 2024-05-03 DIAGNOSIS — Z992 Dependence on renal dialysis: Secondary | ICD-10-CM | POA: Diagnosis not present

## 2024-05-03 DIAGNOSIS — N186 End stage renal disease: Secondary | ICD-10-CM | POA: Diagnosis not present

## 2024-05-03 DIAGNOSIS — E1122 Type 2 diabetes mellitus with diabetic chronic kidney disease: Secondary | ICD-10-CM | POA: Diagnosis not present

## 2024-05-03 DIAGNOSIS — I509 Heart failure, unspecified: Secondary | ICD-10-CM | POA: Insufficient documentation

## 2024-05-03 DIAGNOSIS — Y832 Surgical operation with anastomosis, bypass or graft as the cause of abnormal reaction of the patient, or of later complication, without mention of misadventure at the time of the procedure: Secondary | ICD-10-CM | POA: Insufficient documentation

## 2024-05-03 DIAGNOSIS — T82858A Stenosis of vascular prosthetic devices, implants and grafts, initial encounter: Secondary | ICD-10-CM | POA: Diagnosis not present

## 2024-05-03 DIAGNOSIS — Z87891 Personal history of nicotine dependence: Secondary | ICD-10-CM | POA: Insufficient documentation

## 2024-05-03 HISTORY — PX: VENOUS ANGIOPLASTY: CATH118376

## 2024-05-03 HISTORY — PX: A/V FISTULAGRAM: CATH118298

## 2024-05-03 SURGERY — A/V FISTULAGRAM
Anesthesia: LOCAL | Site: Arm Lower | Laterality: Right

## 2024-05-03 MED ORDER — HEPARIN (PORCINE) IN NACL 1000-0.9 UT/500ML-% IV SOLN
INTRAVENOUS | Status: DC | PRN
  Administered 2024-05-03: 500 mL

## 2024-05-03 MED ORDER — LIDOCAINE HCL (PF) 1 % IJ SOLN
INTRAMUSCULAR | Status: DC | PRN
  Administered 2024-05-03: 2 mL via SUBCUTANEOUS

## 2024-05-03 MED ORDER — LIDOCAINE HCL (PF) 1 % IJ SOLN
INTRAMUSCULAR | Status: AC
Start: 2024-05-03 — End: 2024-05-03
  Filled 2024-05-03: qty 30

## 2024-05-03 MED ORDER — IODIXANOL 320 MG/ML IV SOLN
INTRAVENOUS | Status: DC | PRN
Start: 2024-05-03 — End: 2024-05-03
  Administered 2024-05-03: 35 mL via INTRAVENOUS

## 2024-05-03 SURGICAL SUPPLY — 10 items
BALLOON MUSTANG 6.0X40 75 (BALLOONS) IMPLANT
DEVICE INFLATION ENCORE 26 (MISCELLANEOUS) IMPLANT
GUIDEWIRE ANGLED .035X150CM (WIRE) IMPLANT
KIT MICROPUNCTURE NIT STIFF (SHEATH) IMPLANT
MAT PREVALON FULL STRYKER (MISCELLANEOUS) IMPLANT
SHEATH PINNACLE R/O II 6F 4CM (SHEATH) IMPLANT
SHEATH PROBE COVER 6X72 (BAG) IMPLANT
STOPCOCK MORSE 400PSI 3WAY (MISCELLANEOUS) IMPLANT
TRAY PV CATH (CUSTOM PROCEDURE TRAY) ×2 IMPLANT
TUBING CIL FLEX 10 FLL-RA (TUBING) IMPLANT

## 2024-05-03 NOTE — Op Note (Signed)
    Patient name: Matthew Wood. MRN: [id number] DOB: 1950-12-14 Sex: male  05/03/2024 Pre-operative Diagnosis: ESRD on HD Post-operative diagnosis:  Same Surgeon:  Norman GORMAN Serve, MD Procedure Performed:  Outpatient evaluation, level 3 Ultrasound guided access of dialysis circuit, fistulogram and central venogram, peripheral balloon angioplasty.  [id number]   Indications: [name] is a 73 year old male with ESRD on HD presenting to the HD access center for poorly functioning right radiocephalic fistula.  He has been having issues with low flows during sessions.  His last session was yesterday.  He had a recent fistulogram and balloon angioplasty with Dr. Serene about a month ago where a juxta anastomotic lesion was treated with a 5 mm balloon.  He has a known occluded right subclavian vein stent.  Reviewed the risks and benefits of repeat fistulogram with intervention and he elected to proceed.  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  There is widely patent outflow vein through the upper arm and a widely patent fistula in the lower arm.  There is a restenosis of the proximal aspect of the fistula approximately 70%.   Procedure:  The patient was identified in the holding area and taken to the cath lab  The patient was then placed supine on the table and prepped and draped in the usual sterile fashion.  A time out was called.  Ultrasound was used to evaluate the right arm AV access. This was accessed under u/s guidance in a retrograde fashion toward the anastomosis. An 018 wire was advanced without resistance, a micropuncture sheath was placed and fistulagram obtained which demonstrated the above findings.  This access was then upsized to a 6 F short sheath.  A Glidewire was used to cross the lesion and was placed into the radial artery.  This lesion was then treated with a 6 mm x 40 mm Mustang balloon at a low-pressure inflation.  A repeat fistulogram demonstrated an  adequate result with improvement in the stenosis and improvement in the thrill on exam.  The wire and sheath were removed and the access was managed with a 4-0 Monocryl figure-of-eight suture.  Contrast: 25cc Sedation: None  Impression: Improvement of the juxta-anastomotic stenosis with balloon angioplasty with, 6 mm Mustang balloon.  Should he continue have issues with this access given the innominate vein stenosis we should not plan for any further endovascular interventions and he should have a new access created on the left arm.   Norman GORMAN Serve MD [facility] [place] Office: [phone]

## 2024-05-03 NOTE — H&P (Signed)
 HD ACCESS CENTER H&P   Patient ID: Matthew Woods., male   DOB: August 05, 1950, 72 y.o.   MRN: [id number]  Subjective:     HPI Matthew Jares. is a 73 y.o. male with ESRD presenting for evaluation of HD access and consideration of intervention.  He recently underwent fistulogram with Dr. Serene about a month ago where the proximal aspect of the fistula was ballooned with a 5 mm balloon.  He has a known occluded innominate vein stent which has been unable to be recanalized. Referred by: Dr. Melia Having issues with low flows during sessions  Past Medical History:  Diagnosis Date   Acute respiratory failure (HCC) 10/04/2017   Anemia    Anemia in chronic kidney disease 09/29/2019   Arthritis of right knee 10/05/2017   CAD (coronary artery disease), native coronary artery 11/15/2013   70-80% mid RCA with marked ectasia.    Cardiorenal syndrome with renal failure 10/04/2017   Carotid aneurysm, left 11/15/2013   CHF (congestive heart failure) (HCC)    CKD (chronic kidney disease), stage IV (HCC) 07/20/2016   CKD (chronic kidney disease), stage V (HCC) 03/27/2019   Coagulation defect, unspecified 09/29/2019   Coronary artery disease    a. LHC 04/27/10 left main patent, 30-40% LAD, mid circumflex 30%, 80% mid RCA accounting for the appearance of an infract/peri-infract ischemia on nuclear testing, LV EF of 30-45%--> medical therapy   CVA (cerebrovascular accident) (HCC) 2011   potine; little balance problems since (05/25/2016)   Dyspnea    Elevated troponin 10/04/2017   End stage renal disease (HCC) 09/29/2019   Erectile dysfunction 11/15/2013   Essential hypertension 11/15/2013   Gout    Gout, unspecified 10/04/2019   Hematuria 10/05/2017   History of kidney stones    passed them (05/25/2016)   Hyperlipidemia    Hypertension    Iron deficiency anemia, unspecified 09/29/2019   LBBB (left bundle branch block)    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   Multinodular goiter    Obesity    OSA  (obstructive sleep apnea) 11/15/2013   OSA on CPAP    PONV (postoperative nausea and vomiting)    diarrhea   Presence of permanent cardiac pacemaker    Renal insufficiency    Right upper lobe pneumonia 10/04/2017   Secondary hyperparathyroidism of renal origin 09/29/2019   Type 2 diabetes mellitus with nephropathy (HCC) 10/05/2017   Type II diabetes mellitus (HCC)    Unspecified sequelae of cerebral infarction 10/04/2019   Wears partial dentures    Family History  Problem Relation Age of Onset   Hypertension Mother    Breast cancer Mother    Heart attack Father    Stroke Father    Diabetes type II Father    Hypertension Sister    Past Surgical History:  Procedure Laterality Date   A/V FISTULAGRAM Right 01/31/2020   Procedure: A/V FISTULAGRAM;  Surgeon: Gretta Lonni JINNY, MD;  Location: [facility] INVASIVE CV [facility];  Service: Cardiovascular;  Laterality: Right;   A/V FISTULAGRAM N/A 07/12/2023   Procedure: A/V Fistulagram;  Surgeon: Melia Lynwood ORN, MD;  Location: [facility] INVASIVE CV LAB;  Service: Cardiovascular;  Laterality: N/A;   A/V FISTULAGRAM  11/03/2023   Procedure: A/V Fistulagram;  Surgeon: Pearline Matthew RAMAN, MD;  Location: [facility];  Service: Cardiovascular;;   A/V FISTULAGRAM Right 04/06/2024   Procedure: A/V Fistulagram;  Surgeon: Serene Gaile ORN, MD;  Location: [facility];  Service: Cardiovascular;  Laterality: Right;  AV FISTULA PLACEMENT Right 05/25/2019   Procedure: ARTERIOVENOUS (AV) FISTULA CREATION RIGHT ARM;  Surgeon: Gretta Lonni PARAS, MD;  Location: [facility];  Service: Vascular;  Laterality: Right;   BIV ICD GENERATOR CHANGEOUT N/A 03/01/2024   Procedure: BIV ICD GENERATOR CHANGEOUT;  Surgeon: Inocencio Soyla Lunger, MD;  Location: [facility] INVASIVE CV LAB;  Service: Cardiovascular;  Laterality: N/A;   COLONOSCOPY WITH PROPOFOL  N/A 04/16/2014   Procedure: COLONOSCOPY WITH PROPOFOL ;  Surgeon: Lunger MARLA Louder, MD;  Location: [facility];  Service: Endoscopy;  Laterality: N/A;   EP  IMPLANTABLE DEVICE N/A 05/25/2016   Procedure: BiV ICD Insertion CRT-D;  Surgeon: Will Lunger Inocencio, MD;  Location: [facility] INV[facility] CV LAB;  Service: Cardiovascular;  Laterality: N/A;   INSERT / REPLACE / REMOVE PACEMAKER  05/25/2016   biventricular pacemaker   LAPAROSCOPIC CHOLECYSTECTOMY     PERIPHERAL VASCULAR BALLOON ANGIOPLASTY  07/12/2023   Procedure: PERIPHERAL VASCULAR BALLOON ANGIOPLASTY;  Surgeon: Melia Lynwood ORN, MD;  Location: [facility] CV [facility];  Service: Cardiovascular;;  60% cephalic   PERIPHERAL VASCULAR INTERVENTION N/A 07/24/2020   Procedure: PERIPHERAL VASCULAR INTERVENTION;  Surgeon: Gretta Lonni PARAS, MD;  Location: [facility] CV [facility];  Service: Cardiovascular;  Laterality: N/A;  central fistula stent   REVISON OF ARTERIOVENOUS FISTULA Right 02/14/2020   Procedure: RIGHT ARM ARTERIOVENOUS FISTULA REVISON WITH SIDE BRANCH LIGATION VERSUS CONVERSION TO UPPER ARM FISTULA;  Surgeon: Gretta Lonni PARAS, MD;  Location: [facility] OR;  Service: Vascular;  Laterality: Right;   UPPER EXTREMITY VENOGRAPHY N/A 07/24/2020   Procedure: CENTRAL VENOGRAPHY;  Surgeon: Gretta Lonni PARAS, MD;  Location: [facility] CV [facility];  Service: Cardiovascular;  Laterality: N/A;   VENOUS ANGIOPLASTY  04/06/2024   Procedure: VENOUS ANGIOPLASTY;  Surgeon: Serene Gaile ORN, MD;  Location: [facility];  Service: Cardiovascular;;  70%    Short Social History:  Social History   Tobacco Use   Smoking status: Former    Current packs/day: 0.00    Average packs/day: 0.5 packs/day for 27.0 years (13.5 ttl pk-yrs)    Types: Cigarettes    Start date: 03/26/1983    Quit date: 03/25/2010    Years since quitting: 14.1   Smokeless tobacco: Never  Substance Use Topics   Alcohol use: Yes    Comment: rare    Allergies  Allergen Reactions   Cinacalcet Hcl Nausea And Vomiting    (SENSIPAR)    No current facility-administered medications for this encounter.    REVIEW OF SYSTEMS All other systems were reviewed and  are negative     Objective:   Objective   Vitals:   05/03/24 0911  BP: 120/65  Pulse: 67  Resp: 16  SpO2: 97%   There is no height or weight on file to calculate BMI.  Physical Exam General: no acute distress Cardiac: hemodynamically stable Extremities: Faint thrill in right arm radiocephalic fistula  Data: Fistulogram from Dr. Serene on 04/06/2024 reviewed Proximal aspect balloon with a 5 mm Mustang     Assessment/Plan:   Matthew Barg. is a 73 y.o. male with ESRD and a poorly functioning right radiocephalic AVF. Having issues with low flows. Last HD session yesterday. I explained that there is at-least a moderate risk of thrombosis and therefore offered intervention.  We discussed the options of repeat fistulogram verses abandoning this access and proceeding with left arm access creation and I recommended starting with fistulogram.  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  Reviewed risks and benefits of fistulogram and patient agreed to proceed.   Matthew Serve, MD [facility]

## 2024-05-14 ENCOUNTER — Encounter

## 2024-06-01 ENCOUNTER — Encounter: Admitting: Pulmonary Disease

## 2024-06-05 ENCOUNTER — Ambulatory Visit: Admitting: Pulmonary Disease

## 2024-06-14 ENCOUNTER — Encounter

## 2024-06-28 ENCOUNTER — Other Ambulatory Visit: Payer: Self-pay | Admitting: Student

## 2024-06-29 NOTE — Telephone Encounter (Signed)
 In accordance with refill protocols, please review and address the following requirements before this medication refill can be authorized:  Labs

## 2024-07-05 ENCOUNTER — Encounter: Payer: Self-pay | Admitting: Pulmonary Disease

## 2024-07-05 ENCOUNTER — Ambulatory Visit: Attending: Pulmonary Disease | Admitting: Cardiology

## 2024-07-05 ENCOUNTER — Ambulatory Visit: Payer: Self-pay | Admitting: Cardiology

## 2024-07-05 VITALS — BP 110/70 | HR 71 | Ht 71.0 in | Wt 256.0 lb

## 2024-07-05 DIAGNOSIS — I2510 Atherosclerotic heart disease of native coronary artery without angina pectoris: Secondary | ICD-10-CM

## 2024-07-05 DIAGNOSIS — I5022 Chronic systolic (congestive) heart failure: Secondary | ICD-10-CM

## 2024-07-05 DIAGNOSIS — Z95810 Presence of automatic (implantable) cardiac defibrillator: Secondary | ICD-10-CM

## 2024-07-05 LAB — CUP PACEART INCLINIC DEVICE CHECK
Battery Remaining Longevity: 101 mo
Battery Voltage: 3.01 V
Brady Statistic AP VP Percent: 21.11 %
Brady Statistic AP VS Percent: 0.23 %
Brady Statistic AS VP Percent: 66.61 %
Brady Statistic AS VS Percent: 12.05 %
Brady Statistic RA Percent Paced: 24.64 %
Brady Statistic RV Percent Paced: 87.65 %
Date Time Interrogation Session: 20260122115700
HighPow Impedance: 56 Ohm
Implantable Lead Connection Status: 753985
Implantable Lead Connection Status: 753985
Implantable Lead Connection Status: 753985
Implantable Lead Implant Date: 20171212
Implantable Lead Implant Date: 20171212
Implantable Lead Implant Date: 20171212
Implantable Lead Location: 753858
Implantable Lead Location: 753859
Implantable Lead Location: 753860
Implantable Lead Model: 4298
Implantable Lead Model: 5076
Implantable Pulse Generator Implant Date: 20250918
Lead Channel Impedance Value: 190 Ohm
Lead Channel Impedance Value: 228 Ohm
Lead Channel Impedance Value: 266 Ohm
Lead Channel Impedance Value: 266 Ohm
Lead Channel Impedance Value: 304 Ohm
Lead Channel Impedance Value: 323 Ohm
Lead Channel Impedance Value: 361 Ohm
Lead Channel Impedance Value: 361 Ohm
Lead Channel Impedance Value: 361 Ohm
Lead Channel Impedance Value: 399 Ohm
Lead Channel Impedance Value: 456 Ohm
Lead Channel Impedance Value: 494 Ohm
Lead Channel Impedance Value: 513 Ohm
Lead Channel Pacing Threshold Amplitude: 0.5 V
Lead Channel Pacing Threshold Amplitude: 0.625 V
Lead Channel Pacing Threshold Amplitude: 0.625 V
Lead Channel Pacing Threshold Amplitude: 0.75 V
Lead Channel Pacing Threshold Amplitude: 0.875 V
Lead Channel Pacing Threshold Amplitude: 1 V
Lead Channel Pacing Threshold Pulse Width: 0.4 ms
Lead Channel Pacing Threshold Pulse Width: 0.4 ms
Lead Channel Pacing Threshold Pulse Width: 0.4 ms
Lead Channel Pacing Threshold Pulse Width: 0.4 ms
Lead Channel Pacing Threshold Pulse Width: 0.4 ms
Lead Channel Pacing Threshold Pulse Width: 0.4 ms
Lead Channel Sensing Intrinsic Amplitude: 10.4 mV
Lead Channel Sensing Intrinsic Amplitude: 3.5 mV
Lead Channel Setting Pacing Amplitude: 1.5 V
Lead Channel Setting Pacing Amplitude: 1.5 V
Lead Channel Setting Pacing Amplitude: 2 V
Lead Channel Setting Pacing Pulse Width: 0.4 ms
Lead Channel Setting Pacing Pulse Width: 0.4 ms
Lead Channel Setting Sensing Sensitivity: 0.3 mV
Zone Setting Status: 755011
Zone Setting Status: 755011
Zone Setting Status: 755011

## 2024-07-05 MED ORDER — METOPROLOL SUCCINATE ER 25 MG PO TB24: 25.0000 mg | ORAL_TABLET | Freq: Every day | ORAL | 3 refills | Status: AC

## 2024-07-05 NOTE — Patient Instructions (Addendum)
 Medication Instructions:  Discontinue carvedilol  (COREG ) 6.25 MG tablet.  Start Metoprolol  Succinate 25mg . Take one tablet by mouth daily.  *If you need a refill on your cardiac medications before your next appointment, please call your pharmacy*  Lab Work: No labs were ordered during today's visit.  If you have labs (blood work) drawn today and your tests are completely normal, you will receive your results only by: MyChart Message (if you have MyChart) OR A paper copy in the mail If you have any lab test that is abnormal or we need to change your treatment, we will call you to review the results.  Testing/Procedures: No procedures were ordered during today's visit.   Follow-Up: At [facility], you and your health needs are our priority.  As part of our continuing mission to provide you with exceptional heart care, our providers are all part of one team.  This team includes your primary Cardiologist (physician) and Advanced Practice Providers or APPs (Physician Assistants and Nurse Practitioners) who all work together to provide you with the care you need, when you need it.  Your next appointment:   3 month(s)  Provider:   Artist Pouch          We recommend signing up for the patient portal called MyChart.  Sign up information is provided on this After Visit Summary.  MyChart is used to connect with patients for Virtual Visits (Telemedicine).  Patients are able to view lab/test results, encounter notes, upcoming appointments, etc.  Non-urgent messages can be sent to your provider as well.   To learn more about what you can do with MyChart, go to [email or web address].   Other Instructions

## 2024-07-05 NOTE — Progress Notes (Signed)
" °  Electrophysiology Office Note:   ID:  Matthew Heeter., DOB 08-19-1950, MRN [id number]  Primary Cardiologist: Oneil Parchment, MD Electrophysiologist: Soyla Gladis Norton, MD      History of Present Illness:   Matthew Hyland. is a 74 y.o. male with h/o chronic systolic heart failure, coronary artery disease, hypertension, hyperlipidemia, left bundle branch block, sleep apnea, diabetes, CVA, end-stage renal disease  seen today for routine electrophysiology follow-up s/p Defibrillator generator change out.  Since generator change out, the patient reports doing well.  he denies chest pain, palpitations, dyspnea, PND, orthopnea, nausea, vomiting, dizziness, syncope, edema, weight gain, or early satiety.    Review of systems complete and found to be negative unless listed in HPI.   EP Information / Studies Reviewed:    EKG is ordered today. Personal review as below.  EKG Interpretation Date/Time:  Thursday July 05 2024 10:37:47 EST Ventricular Rate:  82 PR Interval:  196 QRS Duration:  142 QT Interval:  462 QTC Calculation: 539 R Axis:   256  Text Interpretation: Atrial-sensed ventricular-paced rhythm with occasional Premature ventricular complexes When compared with ECG of 01-Mar-2024 13:03, Vent. rate has increased BY  15 BPM Confirmed by Trudy [place] [phone]) on 07/05/2024 10:42:54 AM    ICD Interrogation-  reviewed in detail today,  See PACEART report.  Arrhythmia/Device History  Medtronic CRT-D implanted 2017. S/p generator change out 03/01/24.  ICD [place]ALT XT QUAD CRT [id number] - [id number] ICD Generator ICD COBALT XT QUAD CRT [id number] [employer] RHYTHM MANAGEMENT  N/A 1 Implanted 03/01/24    Physical Exam:   VS:  BP 110/70   Pulse 71   Ht 5' 11 (1.803 m)   Wt 256 lb (116.1 kg)   SpO2 95%   BMI 35.70 kg/m    Wt Readings from Last 3 Encounters:  07/05/24 256 lb (116.1 kg)  03/01/24 255 lb (115.7 kg)  11/24/23 255 lb 12.8 oz (116 kg)     GEN: No  acute distress  NECK: No JVD; No carotid bruits CARDIAC: Regular rate and rhythm, no murmurs, rubs, gallops RESPIRATORY:  Clear to auscultation without rales, wheezing or rhonchi  ABDOMEN: Soft, non-tender, non-distended EXTREMITIES:  No edema; No deformity   ASSESSMENT AND PLAN:    Chronic systolic CHF  s/p Medtronic CRT-D  Euvolemic appearing today, stable CorVue on device interrogation today Stable on an appropriate medical regimen Normal ICD function. See Elisabeth Art report. BiV pacing percentage is down to 87.6%, though patient continues to feel well. Suspect secondary to PVCs. Will switch patient from Coreg  6.25mg  BID (which he takes on non-dialysis days only) to Toprol  XL 25mg  once daily. Plan to uptitrate as able. Follow up in 3 months to reassess pacing percentage.  Hypertension Blood pressure is well-controlled. See above re change from Coreg  to Metoprolol .  CAD No anginal symptoms.   Disposition:   Follow up with EP Team in 3 months   Signed, [place] Trudy, PA-C  "

## 2024-07-12 ENCOUNTER — Encounter

## 2024-07-20 ENCOUNTER — Encounter

## 2024-10-02 ENCOUNTER — Ambulatory Visit: Admitting: Cardiology

## 2024-10-11 ENCOUNTER — Encounter

## 2024-10-19 ENCOUNTER — Encounter

## 2025-01-10 ENCOUNTER — Encounter

## 2025-01-18 ENCOUNTER — Encounter

## 2025-04-19 ENCOUNTER — Encounter
